# Patient Record
Sex: Female | Born: 1980 | Race: White | Hispanic: No | Smoking: Current every day smoker
Health system: Southern US, Community
[De-identification: ages and names within clinical notes are randomized; demographics above are authoritative.]

## PROBLEM LIST (undated history)

## (undated) DIAGNOSIS — I10 Essential (primary) hypertension: Secondary | ICD-10-CM

## (undated) DIAGNOSIS — M549 Dorsalgia, unspecified: Secondary | ICD-10-CM

## (undated) DIAGNOSIS — F32A Depression, unspecified: Secondary | ICD-10-CM

## (undated) DIAGNOSIS — M40209 Unspecified kyphosis, site unspecified: Secondary | ICD-10-CM

## (undated) DIAGNOSIS — M4200 Juvenile osteochondrosis of spine, site unspecified: Secondary | ICD-10-CM

## (undated) DIAGNOSIS — F419 Anxiety disorder, unspecified: Secondary | ICD-10-CM

## (undated) HISTORY — PX: TONSILLECTOMY: SUR1361

---
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
## 2001-04-22 ENCOUNTER — Encounter: Payer: Self-pay | Admitting: Emergency Medicine

## 2001-04-22 ENCOUNTER — Emergency Department (HOSPITAL_COMMUNITY): Admission: EM | Admit: 2001-04-22 | Discharge: 2001-04-22 | Payer: Self-pay | Admitting: Emergency Medicine

## 2001-05-23 ENCOUNTER — Emergency Department (HOSPITAL_COMMUNITY): Admission: EM | Admit: 2001-05-23 | Discharge: 2001-05-23 | Payer: Self-pay | Admitting: Emergency Medicine

## 2001-07-05 ENCOUNTER — Emergency Department (HOSPITAL_COMMUNITY): Admission: EM | Admit: 2001-07-05 | Discharge: 2001-07-05 | Payer: Self-pay | Admitting: Emergency Medicine

## 2001-07-05 ENCOUNTER — Encounter: Payer: Self-pay | Admitting: Emergency Medicine

## 2002-03-11 ENCOUNTER — Emergency Department (HOSPITAL_COMMUNITY): Admission: EM | Admit: 2002-03-11 | Discharge: 2002-03-11 | Payer: Self-pay | Admitting: Emergency Medicine

## 2003-06-05 ENCOUNTER — Emergency Department (HOSPITAL_COMMUNITY): Admission: EM | Admit: 2003-06-05 | Discharge: 2003-06-05 | Payer: Self-pay | Admitting: Emergency Medicine

## 2004-04-16 ENCOUNTER — Emergency Department (HOSPITAL_COMMUNITY): Admission: EM | Admit: 2004-04-16 | Discharge: 2004-04-16 | Payer: Self-pay | Admitting: Emergency Medicine

## 2004-11-26 ENCOUNTER — Emergency Department (HOSPITAL_COMMUNITY): Admission: EM | Admit: 2004-11-26 | Discharge: 2004-11-26 | Payer: Self-pay | Admitting: Emergency Medicine

## 2005-11-07 ENCOUNTER — Emergency Department (HOSPITAL_COMMUNITY): Admission: EM | Admit: 2005-11-07 | Discharge: 2005-11-07 | Payer: Self-pay | Admitting: Family Medicine

## 2005-12-01 ENCOUNTER — Emergency Department (HOSPITAL_COMMUNITY): Admission: EM | Admit: 2005-12-01 | Discharge: 2005-12-01 | Payer: Self-pay | Admitting: Emergency Medicine

## 2006-12-07 ENCOUNTER — Emergency Department (HOSPITAL_COMMUNITY): Admission: EM | Admit: 2006-12-07 | Discharge: 2006-12-07 | Payer: Self-pay | Admitting: Emergency Medicine

## 2007-07-19 ENCOUNTER — Emergency Department (HOSPITAL_COMMUNITY): Admission: EM | Admit: 2007-07-19 | Discharge: 2007-07-19 | Payer: Self-pay | Admitting: Emergency Medicine

## 2007-11-25 ENCOUNTER — Emergency Department (HOSPITAL_COMMUNITY): Admission: EM | Admit: 2007-11-25 | Discharge: 2007-11-25 | Payer: Self-pay | Admitting: Emergency Medicine

## 2008-11-07 ENCOUNTER — Inpatient Hospital Stay (HOSPITAL_COMMUNITY): Admission: AD | Admit: 2008-11-07 | Discharge: 2008-11-07 | Payer: Self-pay | Admitting: Obstetrics and Gynecology

## 2009-02-12 ENCOUNTER — Inpatient Hospital Stay (HOSPITAL_COMMUNITY): Admission: AD | Admit: 2009-02-12 | Discharge: 2009-02-12 | Payer: Self-pay | Admitting: Family Medicine

## 2009-02-15 ENCOUNTER — Encounter: Payer: Self-pay | Admitting: Family Medicine

## 2009-02-15 ENCOUNTER — Ambulatory Visit (HOSPITAL_COMMUNITY)
Admission: RE | Admit: 2009-02-15 | Discharge: 2009-02-15 | Payer: Self-pay | Source: Home / Self Care | Admitting: Family Medicine

## 2009-03-20 ENCOUNTER — Emergency Department (HOSPITAL_COMMUNITY): Admission: EM | Admit: 2009-03-20 | Discharge: 2009-03-20 | Payer: Self-pay | Admitting: Emergency Medicine

## 2009-05-17 ENCOUNTER — Ambulatory Visit: Payer: Self-pay | Admitting: Obstetrics and Gynecology

## 2009-05-17 ENCOUNTER — Inpatient Hospital Stay (HOSPITAL_COMMUNITY): Admission: AD | Admit: 2009-05-17 | Discharge: 2009-05-17 | Payer: Self-pay | Admitting: Obstetrics and Gynecology

## 2009-05-29 ENCOUNTER — Ambulatory Visit: Payer: Self-pay | Admitting: Obstetrics and Gynecology

## 2009-05-29 LAB — CONVERTED CEMR LAB
ALT: <8 units/L (ref 0–35)
AST: 9 units/L (ref 0–37)
Albumin: 3.4 g/dL — ABNORMAL LOW (ref 3.5–5.2)
Alkaline Phosphatase: 120 units/L — ABNORMAL HIGH (ref 39–117)
Antibody Screen: NEGATIVE
BUN: 3 mg/dL — ABNORMAL LOW (ref 6–23)
Basophils Absolute: 0 10*3/uL (ref 0.0–0.1)
Basophils Relative: 0 % (ref 0–1)
CO2: 20 meq/L (ref 19–32)
Calcium: 8.7 mg/dL (ref 8.4–10.5)
Chloride: 106 meq/L (ref 96–112)
Creatinine, Ser: 0.4 mg/dL (ref 0.40–1.20)
Eosinophils Absolute: 0.1 10*3/uL (ref 0.0–0.7)
Eosinophils Relative: 1 % (ref 0–5)
Glucose, Bld: 132 mg/dL — ABNORMAL HIGH (ref 70–99)
HCT: 31.5 % — ABNORMAL LOW (ref 36.0–46.0)
Hemoglobin: 10.8 g/dL — ABNORMAL LOW (ref 12.0–15.0)
Hepatitis B Surface Ag: NEGATIVE
Lymphocytes Relative: 24 % (ref 12–46)
Lymphs Abs: 2.3 10*3/uL (ref 0.7–4.0)
MCHC: 34.3 g/dL (ref 30.0–36.0)
MCV: 85.8 fL (ref 78.0–100.0)
Monocytes Absolute: 0.4 10*3/uL (ref 0.1–1.0)
Monocytes Relative: 4 % (ref 3–12)
Neutro Abs: 6.6 10*3/uL (ref 1.7–7.7)
Neutrophils Relative %: 70 % (ref 43–77)
Platelets: 220 10*3/uL (ref 150–400)
Potassium: 3.6 meq/L (ref 3.5–5.3)
RBC: 3.67 M/uL — ABNORMAL LOW (ref 3.87–5.11)
RDW: 13.5 % (ref 11.5–15.5)
Rh Type: POSITIVE
Rubella: 69.8 intl units/mL — ABNORMAL HIGH
Sodium: 138 meq/L (ref 135–145)
TSH: 1.422 microintl units/mL (ref 0.350–4.500)
Total Bilirubin: 0.6 mg/dL (ref 0.3–1.2)
Total Protein: 6.2 g/dL (ref 6.0–8.3)
Uric Acid, Serum: 3.6 mg/dL (ref 2.4–7.0)
WBC: 9.3 10*3/uL (ref 4.0–10.5)

## 2009-05-30 ENCOUNTER — Ambulatory Visit (HOSPITAL_COMMUNITY): Admission: RE | Admit: 2009-05-30 | Discharge: 2009-05-30 | Payer: Self-pay | Admitting: Obstetrics and Gynecology

## 2009-05-30 ENCOUNTER — Ambulatory Visit: Payer: Self-pay | Admitting: Obstetrics and Gynecology

## 2009-05-30 ENCOUNTER — Encounter: Payer: Self-pay | Admitting: Obstetrics & Gynecology

## 2009-05-30 LAB — CONVERTED CEMR LAB
Collection Interval-CRCL: 24 hr
Creatinine 24 HR UR: 1046 mg/24hr (ref 700–1800)
Creatinine Clearance: 182 mL/min — ABNORMAL HIGH (ref 75–115)
Creatinine, Urine: 123 mg/dL
Protein, Ur: 60 mg/24hr (ref 50–100)

## 2009-06-06 ENCOUNTER — Ambulatory Visit: Payer: Self-pay | Admitting: Obstetrics & Gynecology

## 2009-06-07 ENCOUNTER — Encounter: Payer: Self-pay | Admitting: Obstetrics & Gynecology

## 2009-06-07 LAB — CONVERTED CEMR LAB
Amphetamine Screen, Ur: NEGATIVE
Barbiturate Quant, Ur: NEGATIVE
Benzodiazepines.: NEGATIVE
Cocaine Metabolites: NEGATIVE
Creatinine,U: 52 mg/dL
Marijuana Metabolite: NEGATIVE
Methadone: NEGATIVE
Opiate Screen, Urine: NEGATIVE
Phencyclidine (PCP): NEGATIVE
Propoxyphene: NEGATIVE

## 2009-06-27 ENCOUNTER — Encounter: Payer: Self-pay | Admitting: Obstetrics and Gynecology

## 2009-06-27 ENCOUNTER — Ambulatory Visit: Payer: Self-pay | Admitting: Obstetrics & Gynecology

## 2009-06-27 ENCOUNTER — Encounter (INDEPENDENT_AMBULATORY_CARE_PROVIDER_SITE_OTHER): Payer: Self-pay | Admitting: *Deleted

## 2009-06-27 LAB — CONVERTED CEMR LAB
ALT: <8 units/L (ref 0–35)
AST: 10 units/L (ref 0–37)
Albumin: 3.3 g/dL — ABNORMAL LOW (ref 3.5–5.2)
Alkaline Phosphatase: 184 units/L — ABNORMAL HIGH (ref 39–117)
BUN: 5 mg/dL — ABNORMAL LOW (ref 6–23)
CO2: 22 meq/L (ref 19–32)
Calcium: 9 mg/dL (ref 8.4–10.5)
Chlamydia, DNA Probe: NEGATIVE
Chloride: 105 meq/L (ref 96–112)
Creatinine, Ser: 0.43 mg/dL (ref 0.40–1.20)
GC Probe Amp, Genital: NEGATIVE
Glucose, Bld: 77 mg/dL (ref 70–99)
HCT: 31.6 % — ABNORMAL LOW (ref 36.0–46.0)
Hemoglobin: 10.4 g/dL — ABNORMAL LOW (ref 12.0–15.0)
MCHC: 32.9 g/dL (ref 30.0–36.0)
MCV: 82.9 fL (ref 78.0–100.0)
Platelets: 260 10*3/uL (ref 150–400)
Potassium: 3.8 meq/L (ref 3.5–5.3)
RBC: 3.81 M/uL — ABNORMAL LOW (ref 3.87–5.11)
RDW: 12.9 % (ref 11.5–15.5)
Sodium: 136 meq/L (ref 135–145)
Total Bilirubin: 0.5 mg/dL (ref 0.3–1.2)
Total Protein: 6.3 g/dL (ref 6.0–8.3)
Uric Acid, Serum: 3.7 mg/dL (ref 2.4–7.0)
WBC: 10.7 10*3/uL — ABNORMAL HIGH (ref 4.0–10.5)

## 2009-06-28 ENCOUNTER — Encounter (INDEPENDENT_AMBULATORY_CARE_PROVIDER_SITE_OTHER): Payer: Self-pay | Admitting: *Deleted

## 2009-07-01 ENCOUNTER — Ambulatory Visit: Payer: Self-pay | Admitting: Obstetrics & Gynecology

## 2009-07-01 ENCOUNTER — Ambulatory Visit (HOSPITAL_COMMUNITY): Admission: RE | Admit: 2009-07-01 | Discharge: 2009-07-01 | Payer: Self-pay | Admitting: Family Medicine

## 2009-07-02 ENCOUNTER — Encounter: Payer: Self-pay | Admitting: Obstetrics and Gynecology

## 2009-07-02 LAB — CONVERTED CEMR LAB
Collection Interval-CRCL: 24 hr
Creatinine 24 HR UR: 614 mg/24hr — ABNORMAL LOW (ref 700–1800)
Creatinine Clearance: 99 mL/min (ref 75–115)
Creatinine, Urine: 37.2 mg/dL
Protein, Ur: 50 mg/24hr (ref 50–100)

## 2009-07-04 ENCOUNTER — Ambulatory Visit: Payer: Self-pay | Admitting: Obstetrics & Gynecology

## 2009-07-04 ENCOUNTER — Inpatient Hospital Stay (HOSPITAL_COMMUNITY)
Admission: AD | Admit: 2009-07-04 | Discharge: 2009-07-07 | Payer: Self-pay | Source: Home / Self Care | Admitting: Obstetrics & Gynecology

## 2009-07-05 ENCOUNTER — Encounter: Payer: Self-pay | Admitting: Obstetrics & Gynecology

## 2009-07-11 ENCOUNTER — Ambulatory Visit: Payer: Self-pay | Admitting: Obstetrics & Gynecology

## 2009-07-11 LAB — CONVERTED CEMR LAB
Albumin: 2.8 g/dL — ABNORMAL LOW (ref 3.5–5.2)
Alkaline Phosphatase: 111 units/L (ref 39–117)
BUN: 4 mg/dL — ABNORMAL LOW (ref 6–23)
Creatinine, Ser: 0.37 mg/dL — ABNORMAL LOW (ref 0.40–1.20)
Glucose, Bld: 71 mg/dL (ref 70–99)
HCT: 23.8 % — ABNORMAL LOW (ref 36.0–46.0)
Hemoglobin: 7.6 g/dL — ABNORMAL LOW (ref 12.0–15.0)
MCHC: 31.9 g/dL (ref 30.0–36.0)
MCV: 84.1 fL (ref 78.0–100.0)
Potassium: 3.7 meq/L (ref 3.5–5.3)
RBC: 2.83 M/uL — ABNORMAL LOW (ref 3.87–5.11)
RDW: 13.2 % (ref 11.5–15.5)

## 2009-07-26 ENCOUNTER — Emergency Department (HOSPITAL_COMMUNITY): Admission: EM | Admit: 2009-07-26 | Discharge: 2009-07-26 | Payer: Self-pay | Admitting: Emergency Medicine

## 2009-08-03 ENCOUNTER — Emergency Department (HOSPITAL_COMMUNITY): Admission: EM | Admit: 2009-08-03 | Discharge: 2009-08-03 | Payer: Self-pay | Admitting: Emergency Medicine

## 2009-08-30 ENCOUNTER — Ambulatory Visit: Payer: Self-pay | Admitting: Obstetrics and Gynecology

## 2009-08-30 ENCOUNTER — Encounter: Payer: Self-pay | Admitting: Physician Assistant

## 2009-08-30 LAB — CONVERTED CEMR LAB
HCT: 38.4 % (ref 36.0–46.0)
Hemoglobin: 11.7 g/dL — ABNORMAL LOW (ref 12.0–15.0)
MCHC: 30.5 g/dL (ref 30.0–36.0)
RBC: 4.84 M/uL (ref 3.87–5.11)

## 2009-09-04 ENCOUNTER — Ambulatory Visit: Payer: Self-pay | Admitting: Obstetrics and Gynecology

## 2009-11-03 ENCOUNTER — Emergency Department (HOSPITAL_COMMUNITY): Admission: EM | Admit: 2009-11-03 | Discharge: 2009-11-03 | Payer: Self-pay | Admitting: Family Medicine

## 2010-01-05 ENCOUNTER — Emergency Department (HOSPITAL_COMMUNITY)
Admission: EM | Admit: 2010-01-05 | Discharge: 2010-01-05 | Payer: Self-pay | Source: Home / Self Care | Admitting: Emergency Medicine

## 2010-02-18 ENCOUNTER — Inpatient Hospital Stay (HOSPITAL_COMMUNITY)
Admission: AD | Admit: 2010-02-18 | Discharge: 2010-02-18 | Payer: Self-pay | Source: Home / Self Care | Attending: Obstetrics and Gynecology | Admitting: Obstetrics and Gynecology

## 2010-02-24 LAB — WET PREP, GENITAL
Clue Cells Wet Prep HPF POC: NONE SEEN
Yeast Wet Prep HPF POC: NONE SEEN

## 2010-02-24 LAB — GC/CHLAMYDIA PROBE AMP, GENITAL
Chlamydia, DNA Probe: NEGATIVE
GC Probe Amp, Genital: NEGATIVE

## 2010-02-24 LAB — HERPES SIMPLEX VIRUS CULTURE: Culture: NOT DETECTED

## 2010-03-29 ENCOUNTER — Emergency Department (HOSPITAL_COMMUNITY): Payer: Self-pay

## 2010-03-29 ENCOUNTER — Emergency Department (HOSPITAL_COMMUNITY)
Admission: EM | Admit: 2010-03-29 | Discharge: 2010-03-29 | Disposition: A | Discharge status: Home / Self Care | Payer: Self-pay | Attending: Emergency Medicine | Admitting: Emergency Medicine

## 2010-03-29 DIAGNOSIS — Y929 Unspecified place or not applicable: Secondary | ICD-10-CM | POA: Insufficient documentation

## 2010-03-29 DIAGNOSIS — I10 Essential (primary) hypertension: Secondary | ICD-10-CM | POA: Insufficient documentation

## 2010-03-29 DIAGNOSIS — X58XXXA Exposure to other specified factors, initial encounter: Secondary | ICD-10-CM | POA: Insufficient documentation

## 2010-03-29 DIAGNOSIS — S60229A Contusion of unspecified hand, initial encounter: Secondary | ICD-10-CM | POA: Insufficient documentation

## 2010-03-29 DIAGNOSIS — M79609 Pain in unspecified limb: Secondary | ICD-10-CM | POA: Insufficient documentation

## 2010-03-29 DIAGNOSIS — Z79899 Other long term (current) drug therapy: Secondary | ICD-10-CM | POA: Insufficient documentation

## 2010-04-03 ENCOUNTER — Emergency Department (HOSPITAL_COMMUNITY)
Admission: EM | Admit: 2010-04-03 | Discharge: 2010-04-03 | Disposition: A | Discharge status: Home / Self Care | Payer: Self-pay | Attending: Emergency Medicine | Admitting: Emergency Medicine

## 2010-04-03 DIAGNOSIS — I10 Essential (primary) hypertension: Secondary | ICD-10-CM | POA: Insufficient documentation

## 2010-04-03 DIAGNOSIS — G43909 Migraine, unspecified, not intractable, without status migrainosus: Secondary | ICD-10-CM | POA: Insufficient documentation

## 2010-04-27 LAB — URINALYSIS, ROUTINE W REFLEX MICROSCOPIC
Bilirubin Urine: NEGATIVE
Glucose, UA: NEGATIVE mg/dL
Hgb urine dipstick: NEGATIVE
Ketones, ur: NEGATIVE mg/dL
Nitrite: NEGATIVE
Specific Gravity, Urine: 1.015 (ref 1.005–1.030)
Urobilinogen, UA: 0.2 mg/dL (ref 0.0–1.0)
pH: 6.5 (ref 5.0–8.0)
pH: 6 (ref 5.0–8.0)

## 2010-04-27 LAB — WET PREP, GENITAL

## 2010-04-27 LAB — URINE MICROSCOPIC-ADD ON

## 2010-04-28 LAB — CBC
HCT: 22.4 % — ABNORMAL LOW (ref 36.0–46.0)
Hemoglobin: 7.8 g/dL — ABNORMAL LOW (ref 12.0–15.0)
Hemoglobin: 8.9 g/dL — ABNORMAL LOW (ref 12.0–15.0)
MCHC: 34.4 g/dL (ref 30.0–36.0)
MCHC: 34.5 g/dL (ref 30.0–36.0)
MCV: 84.6 fL (ref 78.0–100.0)
Platelets: 188 10*3/uL (ref 150–400)
Platelets: 230 10*3/uL (ref 150–400)
RDW: 13 % (ref 11.5–15.5)
WBC: 11.4 10*3/uL — ABNORMAL HIGH (ref 4.0–10.5)
WBC: 12.9 10*3/uL — ABNORMAL HIGH (ref 4.0–10.5)

## 2010-04-28 LAB — MRSA PCR SCREENING: MRSA by PCR: NEGATIVE

## 2010-04-28 LAB — POCT URINALYSIS DIP (DEVICE)
Bilirubin Urine: NEGATIVE
Glucose, UA: NEGATIVE mg/dL
Glucose, UA: NEGATIVE mg/dL
Hgb urine dipstick: NEGATIVE
Ketones, ur: NEGATIVE mg/dL
Ketones, ur: NEGATIVE mg/dL
Nitrite: NEGATIVE
Protein, ur: NEGATIVE mg/dL
Protein, ur: NEGATIVE mg/dL
Specific Gravity, Urine: <=1.005 (ref 1.005–1.030)
Urobilinogen, UA: 0.2 mg/dL (ref 0.0–1.0)
Urobilinogen, UA: 0.2 mg/dL (ref 0.0–1.0)
pH: 7 (ref 5.0–8.0)

## 2010-04-28 LAB — URINALYSIS, ROUTINE W REFLEX MICROSCOPIC
Bilirubin Urine: NEGATIVE
Glucose, UA: NEGATIVE mg/dL
Ketones, ur: NEGATIVE mg/dL
Nitrite: NEGATIVE
Nitrite: NEGATIVE
Protein, ur: NEGATIVE mg/dL
Protein, ur: NEGATIVE mg/dL
Specific Gravity, Urine: 1.015 (ref 1.005–1.030)
pH: 7 (ref 5.0–8.0)
pH: 7 (ref 5.0–8.0)

## 2010-04-28 LAB — COMPREHENSIVE METABOLIC PANEL
ALT: 10 U/L (ref 0–35)
ALT: 12 U/L (ref 0–35)
AST: 14 U/L (ref 0–37)
Albumin: 2.7 g/dL — ABNORMAL LOW (ref 3.5–5.2)
BUN: 2 mg/dL — ABNORMAL LOW (ref 6–23)
Calcium: 8.3 mg/dL — ABNORMAL LOW (ref 8.4–10.5)
Calcium: 8.9 mg/dL (ref 8.4–10.5)
Creatinine, Ser: 0.37 mg/dL — ABNORMAL LOW (ref 0.4–1.2)
GFR calc Af Amer: >60 mL/min (ref >60)
Glucose, Bld: 77 mg/dL (ref 70–99)
Glucose, Bld: 77 mg/dL (ref 70–99)
Potassium: 3.2 mEq/L — ABNORMAL LOW (ref 3.5–5.1)
Sodium: 136 mEq/L (ref 135–145)
Sodium: 137 mEq/L (ref 135–145)
Total Protein: 4.6 g/dL — ABNORMAL LOW (ref 6.0–8.3)
Total Protein: 6.3 g/dL (ref 6.0–8.3)

## 2010-04-28 LAB — PROTEIN / CREATININE RATIO, URINE
Creatinine, Urine: 33.6 mg/dL
Total Protein, Urine: <6 mg/dL

## 2010-04-28 LAB — RPR: RPR Ser Ql: NONREACTIVE

## 2010-04-29 LAB — POCT URINALYSIS DIP (DEVICE)
Hgb urine dipstick: NEGATIVE
Hgb urine dipstick: NEGATIVE
Ketones, ur: NEGATIVE mg/dL
Protein, ur: NEGATIVE mg/dL
Protein, ur: NEGATIVE mg/dL
Specific Gravity, Urine: 1.01 (ref 1.005–1.030)
Specific Gravity, Urine: 1.015 (ref 1.005–1.030)
Urobilinogen, UA: 0.2 mg/dL (ref 0.0–1.0)
pH: 6.5 (ref 5.0–8.0)

## 2010-04-30 LAB — GC/CHLAMYDIA PROBE AMP, GENITAL: GC Probe Amp, Genital: NEGATIVE

## 2010-04-30 LAB — URINALYSIS, ROUTINE W REFLEX MICROSCOPIC
Hgb urine dipstick: NEGATIVE
Protein, ur: NEGATIVE mg/dL
Urobilinogen, UA: 0.2 mg/dL (ref 0.0–1.0)

## 2010-04-30 LAB — WET PREP, GENITAL
Clue Cells Wet Prep HPF POC: NONE SEEN
Trich, Wet Prep: NONE SEEN

## 2010-04-30 LAB — RAPID URINE DRUG SCREEN, HOSP PERFORMED
Amphetamines: NOT DETECTED
Barbiturates: NOT DETECTED

## 2010-05-16 LAB — URINALYSIS, ROUTINE W REFLEX MICROSCOPIC
Glucose, UA: NEGATIVE mg/dL
Nitrite: NEGATIVE
Protein, ur: NEGATIVE mg/dL
Urobilinogen, UA: 2 mg/dL — ABNORMAL HIGH (ref 0.0–1.0)

## 2010-05-16 LAB — WET PREP, GENITAL

## 2010-05-16 LAB — POCT PREGNANCY, URINE: Preg Test, Ur: POSITIVE

## 2010-05-16 LAB — CBC
HCT: 39.6 % (ref 36.0–46.0)
Platelets: 216 10*3/uL (ref 150–400)
WBC: 8.5 10*3/uL (ref 4.0–10.5)

## 2010-05-16 LAB — HCG, QUANTITATIVE, PREGNANCY: hCG, Beta Chain, Quant, S: 4155 m[IU]/mL — ABNORMAL HIGH (ref <5)

## 2010-05-16 LAB — GC/CHLAMYDIA PROBE AMP, GENITAL: Chlamydia, DNA Probe: NEGATIVE

## 2010-05-31 ENCOUNTER — Inpatient Hospital Stay (INDEPENDENT_AMBULATORY_CARE_PROVIDER_SITE_OTHER)
Admission: RE | Admit: 2010-05-31 | Discharge: 2010-05-31 | Disposition: A | Discharge status: Home / Self Care | Payer: Self-pay | Source: Ambulatory Visit | Attending: Family Medicine | Admitting: Family Medicine

## 2010-05-31 DIAGNOSIS — J309 Allergic rhinitis, unspecified: Secondary | ICD-10-CM

## 2010-05-31 DIAGNOSIS — M799 Soft tissue disorder, unspecified: Secondary | ICD-10-CM

## 2010-08-31 ENCOUNTER — Emergency Department (HOSPITAL_COMMUNITY)
Admission: EM | Admit: 2010-08-31 | Discharge: 2010-08-31 | Disposition: A | Discharge status: Home / Self Care | Payer: Medicaid Other | Attending: Emergency Medicine | Admitting: Emergency Medicine

## 2010-08-31 ENCOUNTER — Encounter: Payer: Self-pay | Admitting: *Deleted

## 2010-08-31 DIAGNOSIS — F17200 Nicotine dependence, unspecified, uncomplicated: Secondary | ICD-10-CM | POA: Insufficient documentation

## 2010-08-31 DIAGNOSIS — I10 Essential (primary) hypertension: Secondary | ICD-10-CM | POA: Insufficient documentation

## 2010-08-31 DIAGNOSIS — R51 Headache: Secondary | ICD-10-CM | POA: Insufficient documentation

## 2010-08-31 DIAGNOSIS — R110 Nausea: Secondary | ICD-10-CM | POA: Insufficient documentation

## 2010-08-31 HISTORY — DX: Essential (primary) hypertension: I10

## 2010-08-31 MED ORDER — METOCLOPRAMIDE HCL 5 MG/ML IJ SOLN
10.0000 mg | Freq: Once | INTRAMUSCULAR | Status: AC
  Administered 2010-08-31: 10 mg via INTRAVENOUS
  Filled 2010-08-31: qty 2

## 2010-08-31 MED ORDER — NAPROXEN 500 MG PO TABS: 250.0000 mg | ORAL_TABLET | Freq: Two times a day (BID) | ORAL | Status: DC | PRN

## 2010-08-31 MED ORDER — MORPHINE SULFATE 4 MG/ML IJ SOLN
4.0000 mg | Freq: Once | INTRAMUSCULAR | Status: AC
  Administered 2010-08-31: 4 mg via INTRAVENOUS
  Filled 2010-08-31: qty 1

## 2010-08-31 MED ORDER — KETOROLAC TROMETHAMINE 30 MG/ML IJ SOLN
30.0000 mg | Freq: Once | INTRAMUSCULAR | Status: AC
  Administered 2010-08-31: 30 mg via INTRAVENOUS
  Filled 2010-08-31: qty 1

## 2010-08-31 MED ORDER — SODIUM CHLORIDE 0.9 % IV BOLUS (SEPSIS)
1000.0000 mL | Freq: Once | INTRAVENOUS | Status: AC
  Administered 2010-08-31: 1000 mL via INTRAVENOUS
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
## 2010-08-31 NOTE — ED Notes (Signed)
Report received. Pt to be discharged after remaining fluids administered.

## 2010-08-31 NOTE — ED Notes (Signed)
Pt has a history of migraines. Pt states she woke up with a severe headache and nausea.

## 2010-08-31 NOTE — ED Provider Notes (Signed)
History     Chief Complaint  Patient presents with  . Headache   Patient is a 30 y.o. female presenting with headaches. The history is provided by the patient.  Headache  This is a recurrent problem. The current episode started 1 to 2 hours ago. The problem occurs constantly. The problem has not changed since onset.The headache is associated with bright light and loud noise. The pain is located in the left unilateral region. The quality of the pain is described as dull and throbbing. The pain is moderate. The pain does not radiate. Associated symptoms include nausea. Pertinent negatives include no fever and no vomiting. She has tried NSAIDs for the symptoms. The treatment provided no relief.    Past Medical History  Diagnosis Date  . Hypertension     History reviewed. No pertinent past surgical history.  History reviewed. No pertinent family history.  History  Substance Use Topics  . Smoking status: Current Everyday Smoker  . Smokeless tobacco: Not on file  . Alcohol Use: No    OB History    Grav Para Term Preterm Abortions TAB SAB Ect Mult Living                  Review of Systems  Constitutional: Negative for fever.  Gastrointestinal: Positive for nausea. Negative for vomiting.  Neurological: Positive for headaches.  All other systems reviewed and are negative.    Physical Exam  BP 152/108  Pulse 99  Temp 97.8 F (36.6 C)  Resp 20  Ht 5\' 2"  (1.575 m)  Wt 165 lb (74.844 kg)  BMI 30.18 kg/m2  SpO2 99%  Physical Exam  Nursing note and vitals reviewed. Constitutional: She is oriented to person, place, and time. She appears well-developed and well-nourished. No distress.  HENT:  Head: Normocephalic and atraumatic.  Eyes: EOM are normal.  Neck: Normal range of motion.  Cardiovascular: Normal rate, regular rhythm and normal heart sounds.   Pulmonary/Chest: Effort normal and breath sounds normal.  Abdominal: Soft. She exhibits no distension. There is no  tenderness.  Musculoskeletal: Normal range of motion.  Neurological: She is alert and oriented to person, place, and time. She has normal strength. GCS eye subscore is 4. GCS verbal subscore is 5. GCS motor subscore is 6.  Skin: Skin is warm and dry.  Psychiatric: She has a normal mood and affect. Judgment normal.    ED Course  Procedures  MDM Typical migraine headache for the pt. Non focal neuro exam. No recent head trauma. No fever. Doubt meningitis. Doubt intracranial bleed. Doubt normal pressure hydrocephalus. No indication for imaging. Improved with tx in ER .  Home with neuro followup     Lyanne Co, MD 08/31/10 [id number]

## 2010-09-01 ENCOUNTER — Other Ambulatory Visit: Payer: Self-pay | Admitting: Obstetrics & Gynecology

## 2010-10-28 ENCOUNTER — Emergency Department (HOSPITAL_COMMUNITY)
Admission: EM | Admit: 2010-10-28 | Discharge: 2010-10-28 | Disposition: A | Discharge status: Home / Self Care | Payer: Medicaid Other | Attending: Emergency Medicine | Admitting: Emergency Medicine

## 2010-10-28 ENCOUNTER — Encounter (HOSPITAL_COMMUNITY): Payer: Self-pay | Admitting: *Deleted

## 2010-10-28 DIAGNOSIS — T2220XA Burn of second degree of shoulder and upper limb, except wrist and hand, unspecified site, initial encounter: Secondary | ICD-10-CM

## 2010-10-28 DIAGNOSIS — T23219A Burn of second degree of unspecified thumb (nail), initial encounter: Secondary | ICD-10-CM | POA: Insufficient documentation

## 2010-10-28 DIAGNOSIS — T2112XA Burn of first degree of abdominal wall, initial encounter: Secondary | ICD-10-CM | POA: Insufficient documentation

## 2010-10-28 DIAGNOSIS — I10 Essential (primary) hypertension: Secondary | ICD-10-CM | POA: Insufficient documentation

## 2010-10-28 DIAGNOSIS — F17200 Nicotine dependence, unspecified, uncomplicated: Secondary | ICD-10-CM | POA: Insufficient documentation

## 2010-10-28 DIAGNOSIS — X19XXXA Contact with other heat and hot substances, initial encounter: Secondary | ICD-10-CM | POA: Insufficient documentation

## 2010-10-28 MED ORDER — TETANUS-DIPHTH-ACELL PERTUSSIS 5-2.5-18.5 LF-MCG/0.5 IM SUSP
0.5000 mL | Freq: Once | INTRAMUSCULAR | Status: AC
  Administered 2010-10-28: 0.5 mL via INTRAMUSCULAR
  Filled 2010-10-28: qty 0.5

## 2010-10-28 MED ORDER — OXYCODONE-ACETAMINOPHEN 5-325 MG PO TABS
1.0000 | ORAL_TABLET | Freq: Once | ORAL | Status: AC
  Administered 2010-10-28: 1 via ORAL
  Filled 2010-10-28: qty 1

## 2010-10-28 MED ORDER — SILVER SULFADIAZINE 1 % EX CREA
TOPICAL_CREAM | Freq: Once | CUTANEOUS | Status: AC
  Administered 2010-10-28: 22:00:00 via TOPICAL
  Filled 2010-10-28: qty 50

## 2010-10-28 MED ORDER — OXYCODONE-ACETAMINOPHEN 5-325 MG PO TABS: 1.0000 | ORAL_TABLET | ORAL | Status: AC | PRN

## 2010-10-28 MED ORDER — SILVER SULFADIAZINE 1 % EX CREA
TOPICAL_CREAM | Freq: Every day | CUTANEOUS | Status: DC
Start: 2010-10-28 — End: 2010-12-19
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
## 2010-10-30 ENCOUNTER — Encounter (HOSPITAL_COMMUNITY): Payer: Self-pay | Admitting: *Deleted

## 2010-10-30 ENCOUNTER — Emergency Department (HOSPITAL_COMMUNITY)
Admission: EM | Admit: 2010-10-30 | Discharge: 2010-10-30 | Disposition: A | Discharge status: Home / Self Care | Payer: Medicaid Other | Attending: Emergency Medicine | Admitting: Emergency Medicine

## 2010-10-30 DIAGNOSIS — I10 Essential (primary) hypertension: Secondary | ICD-10-CM | POA: Insufficient documentation

## 2010-10-30 DIAGNOSIS — Z4800 Encounter for change or removal of nonsurgical wound dressing: Secondary | ICD-10-CM | POA: Insufficient documentation

## 2010-10-30 DIAGNOSIS — T23229A Burn of second degree of unspecified single finger (nail) except thumb, initial encounter: Secondary | ICD-10-CM

## 2010-10-30 MED ORDER — IBUPROFEN 800 MG PO TABS: 800.0000 mg | ORAL_TABLET | Freq: Three times a day (TID) | ORAL | Status: AC | PRN

## 2010-10-30 MED ORDER — OXYCODONE-ACETAMINOPHEN 5-325 MG PO TABS
2.0000 | ORAL_TABLET | Freq: Once | ORAL | Status: AC
  Administered 2010-10-30: 2 via ORAL
  Filled 2010-10-30: qty 2

## 2010-10-30 MED ORDER — IBUPROFEN 800 MG PO TABS
800.0000 mg | ORAL_TABLET | Freq: Once | ORAL | Status: AC
  Administered 2010-10-30: 800 mg via ORAL
  Filled 2010-10-30: qty 1
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
## 2010-10-30 NOTE — ED Provider Notes (Signed)
Medical screening examination/treatment/procedure(s) were conducted as a shared visit with non-physician practitioner(s) and myself.  I personally evaluated the patient during the encounter  SEE BLANK NOTE   Shelda Jakes, MD 10/30/10 1630
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
## 2010-11-01 ENCOUNTER — Encounter (HOSPITAL_COMMUNITY): Payer: Self-pay

## 2010-11-01 ENCOUNTER — Emergency Department (HOSPITAL_COMMUNITY)
Admission: EM | Admit: 2010-11-01 | Discharge: 2010-11-01 | Disposition: A | Discharge status: Home / Self Care | Payer: Medicaid Other | Attending: Emergency Medicine | Admitting: Emergency Medicine

## 2010-11-01 DIAGNOSIS — IMO0002 Reserved for concepts with insufficient information to code with codable children: Secondary | ICD-10-CM | POA: Insufficient documentation

## 2010-11-01 DIAGNOSIS — Z5189 Encounter for other specified aftercare: Secondary | ICD-10-CM | POA: Insufficient documentation

## 2010-11-01 DIAGNOSIS — T300 Burn of unspecified body region, unspecified degree: Secondary | ICD-10-CM

## 2010-11-01 DIAGNOSIS — I10 Essential (primary) hypertension: Secondary | ICD-10-CM | POA: Insufficient documentation

## 2010-11-01 DIAGNOSIS — Z4800 Encounter for change or removal of nonsurgical wound dressing: Secondary | ICD-10-CM | POA: Insufficient documentation

## 2010-11-01 MED ORDER — DOXYCYCLINE HYCLATE 100 MG PO TABS
100.0000 mg | ORAL_TABLET | Freq: Once | ORAL | Status: AC
  Administered 2010-11-01: 100 mg via ORAL
  Filled 2010-11-01: qty 1

## 2010-11-01 MED ORDER — DOXYCYCLINE HYCLATE 100 MG PO CAPS: 100.0000 mg | ORAL_CAPSULE | Freq: Two times a day (BID) | ORAL | Status: AC

## 2010-11-01 MED ORDER — OXYCODONE-ACETAMINOPHEN 5-325 MG PO TABS: ORAL_TABLET | ORAL | Status: DC

## 2010-11-01 MED ORDER — GUAIFENESIN-CODEINE 100-10 MG/5ML PO SYRP: 5.0000 mL | ORAL_SOLUTION | Freq: Three times a day (TID) | ORAL | Status: DC | PRN

## 2010-11-01 MED ORDER — OXYCODONE-ACETAMINOPHEN 5-325 MG PO TABS
1.0000 | ORAL_TABLET | Freq: Once | ORAL | Status: AC
  Administered 2010-11-01: 1 via ORAL
  Filled 2010-11-01: qty 1

## 2010-11-01 MED ORDER — SILVER SULFADIAZINE 1 % EX CREA
TOPICAL_CREAM | Freq: Once | CUTANEOUS | Status: AC
  Administered 2010-11-01: 11:00:00 via TOPICAL
  Filled 2010-11-01: qty 50
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
## 2010-11-01 NOTE — ED Provider Notes (Signed)
Medical screening examination/treatment/procedure(s) were performed by non-physician practitioner and as supervising physician I was immediately available for consultation/collaboration.   Laray Anger, DO 11/01/10 1948
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
## 2010-11-04 NOTE — ED Provider Notes (Signed)
Medical screening examination/treatment/procedure(s) were performed by non-physician practitioner and as supervising physician I was immediately available for consultation/collaboration.   Krystan Northrop R Nicki Gracy, MD 11/04/10 1121 

## 2010-11-06 ENCOUNTER — Emergency Department (HOSPITAL_COMMUNITY)
Admission: EM | Admit: 2010-11-06 | Discharge: 2010-11-06 | Disposition: A | Discharge status: Home / Self Care | Payer: Medicaid Other | Attending: Emergency Medicine | Admitting: Emergency Medicine

## 2010-11-06 ENCOUNTER — Encounter (HOSPITAL_COMMUNITY): Payer: Self-pay | Admitting: *Deleted

## 2010-11-06 DIAGNOSIS — T23219A Burn of second degree of unspecified thumb (nail), initial encounter: Secondary | ICD-10-CM | POA: Insufficient documentation

## 2010-11-06 DIAGNOSIS — M79609 Pain in unspecified limb: Secondary | ICD-10-CM | POA: Insufficient documentation

## 2010-11-06 DIAGNOSIS — X088XXA Exposure to other specified smoke, fire and flames, initial encounter: Secondary | ICD-10-CM | POA: Insufficient documentation

## 2010-11-06 DIAGNOSIS — F17200 Nicotine dependence, unspecified, uncomplicated: Secondary | ICD-10-CM | POA: Insufficient documentation

## 2010-11-06 MED ORDER — OXYCODONE-ACETAMINOPHEN 5-325 MG PO TABS: 1.0000 | ORAL_TABLET | ORAL | Status: AC | PRN

## 2010-11-06 NOTE — ED Provider Notes (Signed)
Medical screening examination/treatment/procedure(s) were performed by non-physician practitioner and as supervising physician I was immediately available for consultation/collaboration.  Forbes Cellar, MD 11/06/10 1024
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
## 2010-11-06 NOTE — ED Notes (Signed)
Patient with no complaints at this time. Respirations even and unlabored. Skin warm/dry. Discharge instructions reviewed with patient at this time. Patient given opportunity to voice concerns/ask questions. IV removed per policy and band-aid applied to site. Patient discharged at this time and left Emergency Department with steady gait.  
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
## 2010-11-07 ENCOUNTER — Ambulatory Visit (HOSPITAL_COMMUNITY)
Admission: RE | Admit: 2010-11-07 | Discharge: 2010-11-07 | Disposition: A | Discharge status: Home / Self Care | Payer: Medicaid Other | Source: Ambulatory Visit | Attending: Emergency Medicine | Admitting: Emergency Medicine

## 2010-11-07 DIAGNOSIS — X12XXXA Contact with other hot fluids, initial encounter: Secondary | ICD-10-CM | POA: Insufficient documentation

## 2010-11-07 DIAGNOSIS — T300 Burn of unspecified body region, unspecified degree: Secondary | ICD-10-CM | POA: Insufficient documentation

## 2010-11-07 DIAGNOSIS — IMO0001 Reserved for inherently not codable concepts without codable children: Secondary | ICD-10-CM | POA: Insufficient documentation

## 2010-11-07 DIAGNOSIS — T23219A Burn of second degree of unspecified thumb (nail), initial encounter: Secondary | ICD-10-CM | POA: Insufficient documentation

## 2010-11-07 DIAGNOSIS — Y92009 Unspecified place in unspecified non-institutional (private) residence as the place of occurrence of the external cause: Secondary | ICD-10-CM | POA: Insufficient documentation

## 2010-11-07 NOTE — Patient Instructions (Addendum)
Dress- pt to stop using silvadene
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
## 2010-11-10 ENCOUNTER — Ambulatory Visit (HOSPITAL_COMMUNITY)
Admission: RE | Admit: 2010-11-10 | Discharge: 2010-11-10 | Disposition: A | Discharge status: Home / Self Care | Payer: Medicaid Other | Source: Ambulatory Visit | Attending: Family Medicine | Admitting: Family Medicine

## 2010-11-10 DIAGNOSIS — IMO0001 Reserved for inherently not codable concepts without codable children: Secondary | ICD-10-CM | POA: Insufficient documentation

## 2010-11-10 DIAGNOSIS — Y92009 Unspecified place in unspecified non-institutional (private) residence as the place of occurrence of the external cause: Secondary | ICD-10-CM | POA: Insufficient documentation

## 2010-11-10 DIAGNOSIS — X12XXXA Contact with other hot fluids, initial encounter: Secondary | ICD-10-CM | POA: Insufficient documentation

## 2010-11-10 DIAGNOSIS — T23219A Burn of second degree of unspecified thumb (nail), initial encounter: Secondary | ICD-10-CM | POA: Insufficient documentation

## 2010-11-10 DIAGNOSIS — T300 Burn of unspecified body region, unspecified degree: Secondary | ICD-10-CM

## 2010-11-10 NOTE — Progress Notes (Signed)
Physical Therapy Treatment Patient Details  Name: Angelica Lopez MRN: [id number] Date of Birth: Aug 14, 1980  Today's Date: 11/10/2010 Time: 4098-1191 Time Calculation (min): 29 min Charges: Debridment < 20cm Visit#: 2  of 4   Re-eval: 11/21/10    Subjective: Symptoms/Limitations Symptoms: Pt reports that she has had some pain over the weekend and she is currently only taking OTC for her pain control.  Most of her pain is under her nail bed.  Pain Assessment Currently in Pain?: Yes Pain Score:   6 Pain Location: Hand Pain Type: Acute pain   Exercise/Treatments Wound Therapy: 1. Wound Debridement to L thumb with Karikleanse/guaze, Forceps and Scissors 2. Dressed with Xeroform and gauze and closed with tape.    Physical Therapy Assessment and Plan PT Assessment and Plan Clinical Impression Statement: Today's treatment focus on debridment and dressing change.  Current assessment after treatment granulation: 95%, 5% slough.  PT Plan: debridement and dressing change to L thumb    Goals    Problem List Patient Active Problem List  Diagnoses  . Burn    PT - End of Session Activity Tolerance: Patient tolerated treatment well  Kriss Ishler 11/10/2010, 11:29 AM

## 2010-11-13 ENCOUNTER — Telehealth (HOSPITAL_COMMUNITY): Payer: Self-pay

## 2010-11-13 ENCOUNTER — Inpatient Hospital Stay (HOSPITAL_COMMUNITY): Admission: RE | Admit: 2010-11-13 | Payer: Medicaid Other | Source: Ambulatory Visit | Admitting: Physical Therapy

## 2010-11-20 ENCOUNTER — Other Ambulatory Visit: Payer: Self-pay | Admitting: Obstetrics and Gynecology

## 2010-11-25 ENCOUNTER — Ambulatory Visit (HOSPITAL_COMMUNITY): Payer: Medicaid Other | Admitting: Physical Therapy

## 2010-12-19 ENCOUNTER — Emergency Department (HOSPITAL_COMMUNITY): Payer: Medicaid Other

## 2010-12-19 ENCOUNTER — Encounter (HOSPITAL_COMMUNITY): Payer: Self-pay | Admitting: Emergency Medicine

## 2010-12-19 ENCOUNTER — Emergency Department (HOSPITAL_COMMUNITY)
Admission: EM | Admit: 2010-12-19 | Discharge: 2010-12-19 | Disposition: A | Discharge status: Home / Self Care | Payer: Medicaid Other | Attending: Emergency Medicine | Admitting: Emergency Medicine

## 2010-12-19 DIAGNOSIS — F17200 Nicotine dependence, unspecified, uncomplicated: Secondary | ICD-10-CM | POA: Insufficient documentation

## 2010-12-19 DIAGNOSIS — R110 Nausea: Secondary | ICD-10-CM | POA: Insufficient documentation

## 2010-12-19 DIAGNOSIS — D72829 Elevated white blood cell count, unspecified: Secondary | ICD-10-CM | POA: Insufficient documentation

## 2010-12-19 DIAGNOSIS — K921 Melena: Secondary | ICD-10-CM | POA: Insufficient documentation

## 2010-12-19 DIAGNOSIS — R10819 Abdominal tenderness, unspecified site: Secondary | ICD-10-CM | POA: Insufficient documentation

## 2010-12-19 DIAGNOSIS — R143 Flatulence: Secondary | ICD-10-CM | POA: Insufficient documentation

## 2010-12-19 DIAGNOSIS — Z975 Presence of (intrauterine) contraceptive device: Secondary | ICD-10-CM | POA: Insufficient documentation

## 2010-12-19 DIAGNOSIS — R109 Unspecified abdominal pain: Secondary | ICD-10-CM | POA: Insufficient documentation

## 2010-12-19 DIAGNOSIS — R197 Diarrhea, unspecified: Secondary | ICD-10-CM | POA: Insufficient documentation

## 2010-12-19 DIAGNOSIS — R142 Eructation: Secondary | ICD-10-CM | POA: Insufficient documentation

## 2010-12-19 DIAGNOSIS — I10 Essential (primary) hypertension: Secondary | ICD-10-CM | POA: Insufficient documentation

## 2010-12-19 DIAGNOSIS — R141 Gas pain: Secondary | ICD-10-CM | POA: Insufficient documentation

## 2010-12-19 LAB — BASIC METABOLIC PANEL
CO2: 26 mEq/L (ref 19–32)
Calcium: 9.7 mg/dL (ref 8.4–10.5)
Creatinine, Ser: 0.68 mg/dL (ref 0.50–1.10)
GFR calc Af Amer: >90 mL/min (ref >90)
GFR calc non Af Amer: >90 mL/min (ref >90)

## 2010-12-19 LAB — DIFFERENTIAL
Basophils Absolute: 0 10*3/uL (ref 0.0–0.1)
Basophils Relative: 0 % (ref 0–1)
Eosinophils Absolute: 0 10*3/uL (ref 0.0–0.7)
Eosinophils Relative: 0 % (ref 0–5)
Monocytes Absolute: 0.6 10*3/uL (ref 0.1–1.0)

## 2010-12-19 LAB — URINALYSIS, ROUTINE W REFLEX MICROSCOPIC
Bilirubin Urine: NEGATIVE
Hgb urine dipstick: NEGATIVE
Nitrite: NEGATIVE
Protein, ur: NEGATIVE mg/dL
Urobilinogen, UA: 0.2 mg/dL (ref 0.0–1.0)

## 2010-12-19 LAB — CBC
HCT: 43.8 % (ref 36.0–46.0)
MCH: 29.4 pg (ref 26.0–34.0)
MCHC: 32.6 g/dL (ref 30.0–36.0)
MCV: 90.1 fL (ref 78.0–100.0)
Platelets: 194 10*3/uL (ref 150–400)
RDW: 13.1 % (ref 11.5–15.5)

## 2010-12-19 LAB — LIPASE, BLOOD: Lipase: 12 U/L (ref 11–59)

## 2010-12-19 LAB — URINE MICROSCOPIC-ADD ON

## 2010-12-19 MED ORDER — HYDROMORPHONE HCL PF 2 MG/ML IJ SOLN
1.0000 mg | Freq: Once | INTRAMUSCULAR | Status: AC
  Administered 2010-12-19: 1 mg via INTRAVENOUS
  Filled 2010-12-19: qty 1

## 2010-12-19 MED ORDER — HYDROMORPHONE HCL PF 1 MG/ML IJ SOLN
INTRAMUSCULAR | Status: AC
  Administered 2010-12-19: 1 mg via INTRAVENOUS
  Filled 2010-12-19: qty 1

## 2010-12-19 MED ORDER — ONDANSETRON HCL 4 MG/2ML IJ SOLN
4.0000 mg | Freq: Once | INTRAMUSCULAR | Status: AC
  Administered 2010-12-19: 4 mg via INTRAVENOUS

## 2010-12-19 MED ORDER — HYDROMORPHONE HCL PF 2 MG/ML IJ SOLN
1.0000 mg | Freq: Once | INTRAMUSCULAR | Status: AC
  Administered 2010-12-19: 1 mg via INTRAVENOUS

## 2010-12-19 MED ORDER — SODIUM CHLORIDE 0.9 % IV BOLUS (SEPSIS)
1000.0000 mL | Freq: Once | INTRAVENOUS | Status: AC
  Administered 2010-12-19: 1000 mL via INTRAVENOUS

## 2010-12-19 NOTE — ED Notes (Signed)
Mid abd pain that started last night.  Denies n/v, one episode of diarrhea.  Abd soft, non-distended, tender upon palpation.  Denies GU sx.  Pt tearful, alert and oriented x 4, nad noted.  edp at bedside.

## 2010-12-19 NOTE — ED Notes (Signed)
Pt states she began having abdominal pain yesterday.  Pt states she had diarrhea on Wednesday.  Pt denies nausea and vomiting.  Pt states she had small amount of bright red blood in her stool yesterday.  Pt is alert and oriented x 4 and respirations even and unlabored.

## 2010-12-19 NOTE — Discharge Instructions (Signed)
Use a mid laxative like Miralax to help get you started on having several bowel movements   . Abdominal Pain (Nonspecific) Your exam might not show the exact reason you have abdominal pain. Since there are many different causes of abdominal pain, another checkup and more tests may be needed. It is very important to follow up for lasting (persistent) or worsening symptoms. A possible cause of abdominal pain in any person who still has his or her appendix is acute appendicitis. Appendicitis is often hard to diagnose. Normal blood tests, urine tests, ultrasound, and CT scans do not completely rule out early appendicitis or other causes of abdominal pain. Sometimes, only the changes that happen over time will allow appendicitis and other causes of abdominal pain to be determined. Other potential problems that may require surgery may also take time to become more apparent. Because of this, it is important that you follow all of the instructions below. HOME CARE INSTRUCTIONS   Rest as much as possible.   Do not eat solid food until your pain is gone.   While adults or children have pain: A diet of water, weak decaffeinated tea, broth or bouillon, gelatin, oral rehydration solutions (ORS), frozen ice pops, or ice chips may be helpful.   When pain is gone in adults or children: Start a light diet (dry toast, crackers, applesauce, or white rice). Increase the diet slowly as long as it does not bother you. Eat no dairy products (including cheese and eggs) and no spicy, fatty, fried, or high-fiber foods.   Use no alcohol, caffeine, or cigarettes.   Take your regular medicines unless your caregiver told you not to.   Take any prescribed medicine as directed.   Only take over-the-counter or prescription medicines for pain, discomfort, or fever as directed by your caregiver. Do not give aspirin to children.  If your caregiver has given you a follow-up appointment, it is very important to keep that  appointment. Not keeping the appointment could result in a permanent injury and/or lasting (chronic) pain and/or disability. If there is any problem keeping the appointment, you must call to reschedule.  SEEK IMMEDIATE MEDICAL CARE IF:   Your pain is not gone in 24 hours.   Your pain becomes worse, changes location, or feels different.   You or your child has an oral temperature above 102 F (38.9 C), not controlled by medicine.   Your baby is older than 3 months with a rectal temperature of 102 F (38.9 C) or higher.   Your baby is 72 months old or younger with a rectal temperature of 100.4 F (38 C) or higher.   You have shaking chills.   You keep throwing up (vomiting) or cannot drink liquids.   There is blood in your vomit or you see blood in your bowel movements.   Your bowel movements become dark or black.   You have frequent bowel movements.   Your bowel movements stop (become blocked) or you cannot pass gas.   You have bloody, frequent, or painful urination.   You have yellow discoloration in the skin or whites of the eyes.   Your stomach becomes bloated or bigger.   You have dizziness or fainting.   You have chest or back pain.  MAKE SURE YOU:   Understand these instructions.   Will watch your condition.   Will get help right away if you are not doing well or get worse.  Document Released: 01/26/2005 Document Revised: 10/08/2010 Document Reviewed: 12/24/2008  [employer] Patient Information 2012 [place], [place].

## 2010-12-19 NOTE — ED Provider Notes (Signed)
History  Scribed for [employer]. Colon Branch, MD, the patient was seen in room APA05. This chart was scribed by Hillery Hunter.   CSN: [id number] Arrival date & time: 12/19/2010  8:46 AM   First MD Initiated Contact with Patient 12/19/10 [phone]      Chief Complaint  Patient presents with  . Abdominal Pain   The history is provided by the patient.   Angelica Lopez is a 30 y.o. female who presents to the Emergency Department complaining of constant "crampy" abdominal pain since yesterday. She states that symptoms started yesterday feeling like a stomach ache in her upper abdomen, then gradually worsened and migrated down to her lower abdomen as it intensified and radiates to her lower back. She reports pain is worsened by taking deep breaths. She says she had one loose bowel movement two days ago, and then yesterday a bowel movement that was brown with streaks of blood. She denies associated nausea, vomiting, dysuria, fever, chills, cough. She states the only medication she has tried is Aeronaut[employer] (yesterday) which did not improve symptoms. She denies possibility of pregnancy due to having an IUD in place [place]). PCP: Dr. Tanya Nones.   Time seen: 08:59  Past Medical History  Diagnosis Date  . Hypertension     Past Surgical History  Procedure Date  . Cesarean section     No family history on file.  History  Substance Use Topics  . Smoking status: Current Everyday Smoker -- 1.0 packs/day    Types: Cigarettes  . Smokeless tobacco: Not on file  . Alcohol Use: No    Review of Systems  Constitutional: Negative for fever and chills.  HENT: Negative for trouble swallowing.   Respiratory: Negative for shortness of breath.   Cardiovascular: Negative for chest pain.  Gastrointestinal: Positive for abdominal pain, diarrhea (yesterday) and blood in stool. Negative for nausea and vomiting.  Genitourinary: Negative for dysuria.  Musculoskeletal: Negative for gait problem.  Skin:  Negative for rash.  Neurological: Negative for light-headedness.  Psychiatric/Behavioral: Negative for confusion.    Allergies  Codeine  Home Medications   Current Outpatient Rx  Name Route Sig Dispense Refill  . HYDROCHLOROTHIAZIDE 25 MG PO TABS Oral Take 25 mg by mouth daily.     . IBUPROFEN 200 MG PO TABS Oral Take 400 mg by mouth daily as needed. For headaches       Triage vitals: BP 148/90  Pulse 134  Temp(Src) 99.4 F (37.4 C) (Oral)  Resp 16  Ht 5\' 2"  (1.575 m)  Wt 160 lb (72.576 kg)  BMI 29.26 kg/m2  SpO2 100%  Physical Exam  Nursing note and vitals reviewed. Constitutional: She is oriented to person, place, and time. She appears well-developed and well-nourished. She appears distressed (mild).  HENT:  Head: Normocephalic and atraumatic.  Eyes:       tearful  Neck: Neck supple.  Cardiovascular: Normal rate, regular rhythm and normal heart sounds.  Exam reveals no friction rub.   No murmur heard.      No clicks  Pulmonary/Chest: Effort normal and breath sounds normal. No respiratory distress. She has no wheezes. She has no rales.  Abdominal: Soft. Bowel sounds are normal. She exhibits no distension and no mass. There is tenderness (across lower abdomen and suprapubic). There is no rebound and no guarding.  Musculoskeletal: She exhibits no edema and no tenderness.  Neurological: She is alert and oriented to person, place, and time.  Skin: Skin is warm and dry.  Psychiatric:  She has a normal mood and affect. Her behavior is normal.    ED Course  Procedures  Labs Reviewed  URINALYSIS, ROUTINE W REFLEX MICROSCOPIC - Abnormal; Notable for the following:    Appearance HAZY (*)    Specific Gravity, Urine <1.005 (*)    Leukocytes, UA SMALL (*)    All other components within normal limits  CBC - Abnormal; Notable for the following:    WBC 14.5 (*)    All other components within normal limits  DIFFERENTIAL - Abnormal; Notable for the following:    Neutrophils  Relative 84 (*)    Neutro Abs 12.2 (*)    Lymphocytes Relative 11 (*)    All other components within normal limits  BASIC METABOLIC PANEL - Abnormal; Notable for the following:    Sodium 132 (*)    Potassium 3.4 (*)    Glucose, Bld 110 (*)    All other components within normal limits  URINE MICROSCOPIC-ADD ON - Abnormal; Notable for the following:    Squamous Epithelial / LPF FEW (*)    Bacteria, UA FEW (*)    All other components within normal limits  PREGNANCY, URINE  LIPASE, BLOOD   Dg Abd Acute W/chest  12/19/2010  *RADIOLOGY REPORT*  Clinical Data: Abdominal pain, diarrhea, bright red blood in stool  ACUTE ABDOMEN SERIES (ABDOMEN 2 VIEW & CHEST 1 VIEW)  Comparison: None  Findings: Upper-normal size of cardiac silhouette. Mediastinal contours and pulmonary vascularity normal. Bronchitic changes without infiltrate or effusion. No pneumothorax. Scattered gas and stool in colon. Nonobstructive bowel gas pattern. No definite bowel wall thickening, bowel dilatation or free intraperitoneal air. IUD projects over pelvis. Bones unremarkable.  IMPRESSION: No acute abdominal findings. Mild bronchitic changes.  Original Report Authenticated By: Lollie Marrow, M.D.     OTHER DATA REVIEWED: Nursing notes, vital signs reviewed.   DIAGNOSTIC STUDIES: Oxygen Saturation is 100% on room air, normal by my interpretation.     ED COURSE / COORDINATION OF CARE: 09:08. Initial orders: Urinalysis with microscopic ; Pregnancy, urine ; sodium chloride 0.9 % bolus 1,000 mL ; CBC ; Differential ; Basic metabolic panel ; Lipase, blood ; DG Abd Acute W/Chest ; HYDROmorphone (DILAUDID) injection 1 mg ; ondansetron (ZOFRAN) injection 4 mg  10:18. Patient feels improved after medications, just returned from radiology. I discussed preliminary test results with patient and plan of treatment.  11:03. Independently viewed radiology films. Printed copy to show patient. Discussed radiology findings with patient at  bedside including plan of treatment for home care.    MDM  Patient presented with abdominal pain associatedwith diarrhea x 1 , nausea. Labs with elevated wbc c/w gastroenteritis. Xrays with large amount of gas and stool, non obstructive pattern. Patient given IVF, analgesics, antiemetics with improvement.Pt feels improved after observation and/or treatment in ED.Pt stable in ED with no significant deterioration in condition.The patient appears reasonably screened and/or stabilized for discharge and I doubt any other medical condition or other [facility] requiring further screening, evaluation, or treatment in the ED at this time prior to discharge.  MDM Reviewed: nursing note and vitals Interpretation: labs and x-ray    No diagnosis found.    I personally performed the services described in this documentation, which was scribed in my presence. The recorded information has been reviewed and considered.   Nicoletta Dress. Colon Branch, MD 12/19/10 1128

## 2011-01-09 ENCOUNTER — Emergency Department (HOSPITAL_COMMUNITY)
Admission: EM | Admit: 2011-01-09 | Discharge: 2011-01-09 | Disposition: A | Discharge status: Home / Self Care | Payer: Medicaid Other | Attending: Emergency Medicine | Admitting: Emergency Medicine

## 2011-01-09 ENCOUNTER — Emergency Department (HOSPITAL_COMMUNITY): Payer: Medicaid Other

## 2011-01-09 ENCOUNTER — Encounter (HOSPITAL_COMMUNITY): Payer: Self-pay | Admitting: Emergency Medicine

## 2011-01-09 DIAGNOSIS — N730 Acute parametritis and pelvic cellulitis: Secondary | ICD-10-CM | POA: Insufficient documentation

## 2011-01-09 DIAGNOSIS — Z975 Presence of (intrauterine) contraceptive device: Secondary | ICD-10-CM | POA: Insufficient documentation

## 2011-01-09 DIAGNOSIS — I10 Essential (primary) hypertension: Secondary | ICD-10-CM | POA: Insufficient documentation

## 2011-01-09 LAB — URINE MICROSCOPIC-ADD ON

## 2011-01-09 LAB — BASIC METABOLIC PANEL
BUN: 5 mg/dL — ABNORMAL LOW (ref 6–23)
Chloride: 101 mEq/L (ref 96–112)
Glucose, Bld: 103 mg/dL — ABNORMAL HIGH (ref 70–99)
Potassium: 3.6 mEq/L (ref 3.5–5.1)

## 2011-01-09 LAB — URINALYSIS, ROUTINE W REFLEX MICROSCOPIC
Glucose, UA: NEGATIVE mg/dL
Nitrite: NEGATIVE
Specific Gravity, Urine: 1.025 (ref 1.005–1.030)
pH: 6 (ref 5.0–8.0)

## 2011-01-09 LAB — CBC
Hemoglobin: 13.2 g/dL (ref 12.0–15.0)
Platelets: 365 10*3/uL (ref 150–400)
RBC: 4.5 MIL/uL (ref 3.87–5.11)
WBC: 17.8 10*3/uL — ABNORMAL HIGH (ref 4.0–10.5)

## 2011-01-09 LAB — WET PREP, GENITAL

## 2011-01-09 LAB — DIFFERENTIAL
Lymphs Abs: 3 10*3/uL (ref 0.7–4.0)
Monocytes Relative: 7 % (ref 3–12)
Neutro Abs: 13.3 10*3/uL — ABNORMAL HIGH (ref 1.7–7.7)
Neutrophils Relative %: 75 % (ref 43–77)

## 2011-01-09 MED ORDER — HYDROCODONE-ACETAMINOPHEN 5-325 MG PO TABS: ORAL_TABLET | ORAL | Status: AC

## 2011-01-09 MED ORDER — DOXYCYCLINE HYCLATE 100 MG PO CAPS: 100.0000 mg | ORAL_CAPSULE | Freq: Two times a day (BID) | ORAL | Status: AC

## 2011-01-09 MED ORDER — CEFTRIAXONE SODIUM 500 MG IJ SOLR
500.0000 mg | Freq: Once | INTRAMUSCULAR | Status: AC
  Administered 2011-01-09: 500 mg via INTRAMUSCULAR
  Filled 2011-01-09: qty 500

## 2011-01-09 MED ORDER — LIDOCAINE HCL (PF) 1 % IJ SOLN
INTRAMUSCULAR | Status: AC
  Administered 2011-01-09: 2.1 mL
  Filled 2011-01-09: qty 5

## 2011-01-09 MED ORDER — OXYCODONE-ACETAMINOPHEN 5-325 MG PO TABS
1.0000 | ORAL_TABLET | Freq: Once | ORAL | Status: AC
Start: 2011-01-09 — End: 2011-01-09
  Administered 2011-01-09: 1 via ORAL
  Filled 2011-01-09: qty 1

## 2011-01-09 MED ORDER — METRONIDAZOLE 500 MG PO TABS: 500.0000 mg | ORAL_TABLET | Freq: Two times a day (BID) | ORAL | Status: AC

## 2011-01-09 NOTE — ED Notes (Signed)
Pt resting comfortably at this time.

## 2011-01-09 NOTE — ED Notes (Signed)
Pt c/o pain in her left groin area. Pt states that she has not been urinating as much as normal. Also c/o Angelica Lopez vaginal discharge. No tenderness to palpation noted in abdomen. Denies pain with movement. Alert and oriented x 3. Skin warm and dry. Color pink. Breath sounds clear and equal bilaterally.

## 2011-01-09 NOTE — ED Notes (Signed)
Pt c/o pelvic pain with brown vaginal d/c x 4 days. Denies n/v/d.

## 2011-01-09 NOTE — ED Provider Notes (Signed)
History     CSN: [id number] Arrival date & time: 01/09/2011  9:48 AM   First MD Initiated Contact with Patient 01/09/11 1000      Chief Complaint  Patient presents with  . Pelvic Pain    (Consider location/radiation/quality/duration/timing/severity/associated sxs/prior treatment) HPI Comments: Patient c/o left lower pelvic pain, and vaginal discharge for 4 days.  She denies fever, vomiting or new sexual partners.    Patient is a 30 y.o. female presenting with vaginal discharge. The history is provided by the patient.  Vaginal Discharge This is a new problem. The current episode started in the past 7 days. The problem occurs constantly. The problem has been unchanged. Pertinent negatives include no abdominal pain, arthralgias, chest pain, chills, coughing, fatigue, fever, myalgias, nausea, neck pain, numbness, rash, sore throat, swollen glands, urinary symptoms, vomiting or weakness. Associated symptoms comments: Pelvic pain. The symptoms are aggravated by nothing. She has tried nothing for the symptoms. The treatment provided no relief.    Past Medical History  Diagnosis Date  . Hypertension     Past Surgical History  Procedure Date  . Cesarean section     History reviewed. No pertinent family history.  History  Substance Use Topics  . Smoking status: Current Everyday Smoker -- 1.0 packs/day    Types: Cigarettes  . Smokeless tobacco: Not on file  . Alcohol Use: No    OB History    Grav Para Term Preterm Abortions TAB SAB Ect Mult Living                  Review of Systems  Constitutional: Negative for fever, chills and fatigue.  HENT: Negative for sore throat, trouble swallowing, neck pain and neck stiffness.   Respiratory: Negative for cough, shortness of breath and wheezing.   Cardiovascular: Negative for chest pain and palpitations.  Gastrointestinal: Negative for nausea, vomiting, abdominal pain and blood in stool.  Genitourinary: Positive for vaginal  discharge and vaginal pain. Negative for dysuria, hematuria, flank pain, decreased urine volume, vaginal bleeding and genital sores.  Musculoskeletal: Negative for myalgias, back pain and arthralgias.  Skin: Negative.  Negative for rash.  Neurological: Negative for dizziness, weakness and numbness.  Hematological: Does not bruise/bleed easily.  All other systems reviewed and are negative.    Allergies  Codeine  Home Medications   Current Outpatient Rx  Name Route Sig Dispense Refill  . HYDROCHLOROTHIAZIDE 25 MG PO TABS Oral Take 25 mg by mouth daily.     . IBUPROFEN 200 MG PO TABS Oral Take 400 mg by mouth daily as needed. For headaches       BP 153/100  Pulse 118  Temp 98.6 F (37 C)  Resp 20  Ht 5\' 2"  (1.575 m)  Wt 160 lb (72.576 kg)  BMI 29.26 kg/m2  SpO2 100%  Physical Exam  Nursing note and vitals reviewed. Constitutional: She is oriented to person, place, and time. She appears well-developed and well-nourished. No distress.  HENT:  Head: Normocephalic and atraumatic.  Mouth/Throat: Oropharynx is clear and moist.  Neck: Neck supple.  Cardiovascular: Normal rate, regular rhythm and normal heart sounds.   Pulmonary/Chest: Effort normal and breath sounds normal. No respiratory distress. She exhibits no tenderness.  Abdominal: Soft. She exhibits no distension and no mass. There is no tenderness. There is no rebound and no guarding.  Genitourinary: Uterus normal. Cervix exhibits motion tenderness and discharge. Cervix exhibits no friability. Right adnexum displays no mass, no tenderness and no fullness. Left adnexum displays  tenderness. Left adnexum displays no mass and no fullness. Vaginal discharge found.       Moderate yellow discharge in the vaginal canal.  CMT.  No vagianl bleeding.  IUD in place.  Musculoskeletal: Normal range of motion. She exhibits no edema and no tenderness.  Neurological: She is alert and oriented to person, place, and time. No cranial nerve  deficit. She exhibits normal muscle tone. Coordination normal.  Skin: Skin is warm and dry.  Psychiatric: She has a normal mood and affect.    ED Course  Procedures (including critical care time)  Labs Reviewed  URINALYSIS, ROUTINE W REFLEX MICROSCOPIC - Abnormal; Notable for the following:    Bilirubin Urine SMALL (*)    Ketones, ur TRACE (*)    Protein, ur TRACE (*)    Leukocytes, UA TRACE (*)    All other components within normal limits  URINE MICROSCOPIC-ADD ON - Abnormal; Notable for the following:    Squamous Epithelial / LPF MANY (*)    Bacteria, UA FEW (*)    All other components within normal limits  WET PREP, GENITAL - Abnormal; Notable for the following:    Clue Cells, Wet Prep FEW (*)    WBC, Wet Prep HPF POC MANY (*)    All other components within normal limits  CBC - Abnormal; Notable for the following:    WBC 17.8 (*)    All other components within normal limits  DIFFERENTIAL - Abnormal; Notable for the following:    Neutro Abs 13.3 (*)    Monocytes Absolute 1.3 (*)    All other components within normal limits  BASIC METABOLIC PANEL - Abnormal; Notable for the following:    Glucose, Bld 103 (*)    BUN 5 (*)    Creatinine, Ser 0.45 (*)    All other components within normal limits  PREGNANCY, URINE  GC/CHLAMYDIA PROBE AMP, GENITAL   US Transvaginal Non-ob  01/09/2011  *RADIOLOGY REPORT*  Clinical Data: Pelvic pain.  TRANSABDOMINAL AND TRANSVAGINAL ULTRASOUND OF PELVIS  Technique:  Both transabdominal and transvaginal ultrasound examinations of the pelvis were performed including evaluation of the uterus, ovaries, adnexal regions, and pelvic cul-de-sac.  Comparison: None.  Findings:  Uterus: 8.2 x 4.6 x 4.0 cm.  Normal echotexture.  No focal abnormality.  Endometrium: Normal appearance and thickness, 7 mm.  IUD in place.  Right Ovary: 5.0 x 4.6 x 2.8 cm.  3.2 cm complex hypoechoic area, likely hemorrhagic cyst.  Left Ovary: 7.2 x 3.0 x 3.1 cm.  Two complex  hypoechoic areas within the left ovary, both measuring maximally 2.7 cm, also presumably hemorrhagic cysts.  Other Findings:  No free fluid.  IMPRESSION: Complex hypoechoic areas in the ovaries bilaterally as described above, presumably hemorrhagic cysts.  Endometriomas felt less likely but not excluded.  IUD in place.  Original Report Authenticated By: Cyndie Chime, M.D.   US Pelvis Complete  01/09/2011  *RADIOLOGY REPORT*  Clinical Data: Pelvic pain.  TRANSABDOMINAL AND TRANSVAGINAL ULTRASOUND OF PELVIS  Technique:  Both transabdominal and transvaginal ultrasound examinations of the pelvis were performed including evaluation of the uterus, ovaries, adnexal regions, and pelvic cul-de-sac.  Comparison: None.  Findings:  Uterus: 8.2 x 4.6 x 4.0 cm.  Normal echotexture.  No focal abnormality.  Endometrium: Normal appearance and thickness, 7 mm.  IUD in place.  Right Ovary: 5.0 x 4.6 x 2.8 cm.  3.2 cm complex hypoechoic area, likely hemorrhagic cyst.  Left Ovary: 7.2 x 3.0 x 3.1 cm.  Two complex hypoechoic areas within the left ovary, both measuring maximally 2.7 cm, also presumably hemorrhagic cysts.  Other Findings:  No free fluid.  IMPRESSION: Complex hypoechoic areas in the ovaries bilaterally as described above, presumably hemorrhagic cysts.  Endometriomas felt less likely but not excluded.  IUD in place.  Original Report Authenticated By: Cyndie Chime, M.D.        MDM    1:41 PM patient is feeling better, NAD.  No fever, vomiting or back pain.  Non-toxic appearing.  Has left pelvic pain with purulent vaginal d/c and CMT.  No TOA seen on [place].  Will treat for PID and I have advised her to return here in 1-2 days for recheck if the symptoms worsen.  GC and Chlamydia cultures pending  Pt feels improved after observation and/or treatment in ED.   Patient / Family / Caregiver understand and agree with initial ED impression and plan with expectations set for ED visit.      Ryeleigh Santore L. Emiliana Blaize,  [place] 01/10/11 2310

## 2011-01-11 NOTE — ED Provider Notes (Signed)
Medical screening examination/treatment/procedure(s) were performed by non-physician practitioner and as supervising physician I was immediately available for consultation/collaboration.  [place] results discussed, will Rx as if PID infection, with IV antibiotics and close followup.    Shelda Jakes, MD 01/11/11 [phone]

## 2011-01-13 LAB — GC/CHLAMYDIA PROBE AMP, GENITAL: GC Probe Amp, Genital: POSITIVE — AB

## 2011-06-17 ENCOUNTER — Ambulatory Visit
Admission: RE | Admit: 2011-06-17 | Discharge: 2011-06-17 | Disposition: A | Discharge status: Home / Self Care | Payer: Medicaid Other | Source: Ambulatory Visit | Attending: Physician Assistant | Admitting: Physician Assistant

## 2011-06-17 ENCOUNTER — Other Ambulatory Visit: Payer: Self-pay | Admitting: Physician Assistant

## 2011-06-17 DIAGNOSIS — M549 Dorsalgia, unspecified: Secondary | ICD-10-CM

## 2011-06-27 ENCOUNTER — Encounter (HOSPITAL_COMMUNITY): Payer: Self-pay | Admitting: Emergency Medicine

## 2011-06-27 ENCOUNTER — Emergency Department (HOSPITAL_COMMUNITY)
Admission: EM | Admit: 2011-06-27 | Discharge: 2011-06-27 | Disposition: A | Discharge status: Home / Self Care | Payer: Medicaid Other | Attending: Emergency Medicine | Admitting: Emergency Medicine

## 2011-06-27 DIAGNOSIS — K644 Residual hemorrhoidal skin tags: Secondary | ICD-10-CM | POA: Insufficient documentation

## 2011-06-27 DIAGNOSIS — K6289 Other specified diseases of anus and rectum: Secondary | ICD-10-CM | POA: Insufficient documentation

## 2011-06-27 MED ORDER — LIDOCAINE HCL 2 % EX GEL: Freq: Once | CUTANEOUS | Status: DC

## 2011-06-27 MED ORDER — HYDROCORTISONE ACETATE 25 MG RE SUPP: 25.0000 mg | Freq: Two times a day (BID) | RECTAL | Status: AC

## 2011-06-27 MED ORDER — HYDROCODONE-ACETAMINOPHEN 5-325 MG PO TABS: 1.0000 | ORAL_TABLET | ORAL | Status: AC | PRN

## 2011-06-27 MED ORDER — LIDOCAINE VISCOUS 2 % MT SOLN
OROMUCOSAL | Status: AC
  Administered 2011-06-27: 15 mL
  Filled 2011-06-27: qty 15

## 2011-06-27 NOTE — ED Provider Notes (Signed)
History     CSN: [id number]  Arrival date & time 06/27/11  0846   First MD Initiated Contact with Patient 06/27/11 [phone]      Chief Complaint  Patient presents with  . Rectal Pain    (Consider location/radiation/quality/duration/timing/severity/associated sxs/prior treatment) HPI Comments: Angelica Lopez presents for treatment of rectal pain and hemorrhoids which is been present for the past week and getting worse.  She has tried OTC preparation H. wipes which give temporary relief.  She denies constipation, does have a history of hemorrhoids but not in over 2 years since she was pregnant with her last child.  She denies fevers or chills.  She has had loose stools for the past several days, which she often has when she eats dairy products.  Pain is throbbing and constant without relief.  The history is provided by the patient.    Past Medical History  Diagnosis Date  . Hypertension     Past Surgical History  Procedure Date  . Cesarean section     History reviewed. No pertinent family history.  History  Substance Use Topics  . Smoking status: Current Everyday Smoker -- 1.0 packs/day    Types: Cigarettes  . Smokeless tobacco: Not on file  . Alcohol Use: No    OB History    Grav Para Term Preterm Abortions TAB SAB Ect Mult Living                  Review of Systems  Constitutional: Negative for fever.  HENT: Negative for congestion, sore throat and neck pain.   Eyes: Negative.   Respiratory: Negative for chest tightness and shortness of breath.   Cardiovascular: Negative for chest pain.  Gastrointestinal: Positive for rectal pain. Negative for nausea, vomiting, abdominal pain and constipation.  Genitourinary: Negative.  Negative for dysuria and flank pain.  Musculoskeletal: Negative for joint swelling and arthralgias.  Skin: Negative.  Negative for rash and wound.  Neurological: Negative for dizziness, weakness, light-headedness, numbness and headaches.    Hematological: Negative.   Psychiatric/Behavioral: Negative.     Allergies  Codeine  Home Medications   Current Outpatient Rx  Name Route Sig Dispense Refill  . IBUPROFEN 200 MG PO TABS Oral Take 400 mg by mouth daily as needed. For headaches     . LEVONORGESTREL 20 MCG/24HR IU IUD Intrauterine 1 each by Intrauterine route once.    Marland Kitchen HYDROCODONE-ACETAMINOPHEN 5-325 MG PO TABS Oral Take 1 tablet by mouth every 4 (four) hours as needed for pain. 20 tablet 0  . HYDROCORTISONE ACETATE 25 MG RE SUPP Rectal Place 1 suppository (25 mg total) rectally 2 (two) times daily. 12 suppository 0    BP 144/95  Pulse 120  Temp(Src) 98.2 F (36.8 C) (Oral)  Resp 18  Ht 5' (1.524 m)  Wt 170 lb (77.111 kg)  BMI 33.20 kg/m2  SpO2 100%  Physical Exam  Nursing note and vitals reviewed. Constitutional: She appears well-developed and well-nourished.  HENT:  Head: Normocephalic and atraumatic.  Eyes: Conjunctivae are normal.  Neck: Normal range of motion.  Cardiovascular: Normal rate, regular rhythm, normal heart sounds and intact distal pulses.   Pulmonary/Chest: Effort normal and breath sounds normal. She has no wheezes.  Abdominal: Soft. Bowel sounds are normal. There is no tenderness.  Genitourinary: Rectal exam shows external hemorrhoid.       External hemorrhoid without thrombosis.  Musculoskeletal: Normal range of motion.  Neurological: She is alert.  Skin: Skin is warm and dry.  Psychiatric: She has a normal mood and affect.    ED Course  Procedures (including critical care time)  Labs Reviewed - No data to display No results found.   1. External hemorrhoid    PROCEDURE:  Topical lidocaine 2% jelly was applied to external hemorrhoid, gentle pressure was then applied for proximally 5 minutes and complete reduction of hemorrhoid was obtained with patient receiving significant improvement in pain symptoms.   MDM  Patient was prescribed Anusol suppositories.  She was instructed  to sit in a warm bath for 15 minutes twice daily, followed by gentle reduction, gloves given.  Following this procedure applied the Anusol suppository.  Expect gradual reduction in the size of this hemorrhoid.  Recheck by PCP if not improved over the next week.  Hydrocodone was also prescribed, but caution given about possible constipation.  Encouraged to use sparingly and if she uses she should also use a stool softener as well.        Burgess Amor, PA 06/27/11 1024

## 2011-06-27 NOTE — ED Notes (Signed)
Pt c/o rectal pain x 7 days.

## 2011-06-27 NOTE — Discharge Instructions (Signed)
Hemorrhoids Hemorrhoids are enlarged (dilated) veins around the rectum. There are 2 types of hemorrhoids, and the type of hemorrhoid is determined by its location. Internal hemorrhoids occur in the veins just inside the rectum.They are usually not painful, but they may bleed.However, they may poke through to the outside and become irritated and painful. External hemorrhoids involve the veins outside the anus and can be felt as a painful swelling or hard lump near the anus.They are often itchy and may crack and bleed. Sometimes clots will form in the veins. This makes them swollen and painful. These are called thrombosed hemorrhoids. CAUSES Causes of hemorrhoids include:  Pregnancy. This increases the pressure in the hemorrhoidal veins.   Constipation.   Straining to have a bowel movement.   Obesity.   Heavy lifting or other activity that caused you to strain.  TREATMENT Most of the time hemorrhoids improve in 1 to 2 weeks. However, if symptoms do not seem to be getting better or if you have a lot of rectal bleeding, your caregiver may perform a procedure to help make the hemorrhoids get smaller or remove them completely.Possible treatments include:  Rubber band ligation. A rubber band is placed at the base of the hemorrhoid to cut off the circulation.   Sclerotherapy. A chemical is injected to shrink the hemorrhoid.   Infrared light therapy. Tools are used to burn the hemorrhoid.   Hemorrhoidectomy. This is surgical removal of the hemorrhoid.  HOME CARE INSTRUCTIONS   Increase fiber in your diet. Ask your caregiver about using fiber supplements.   Drink enough water and fluids to keep your urine clear or pale yellow.   Exercise regularly.   Go to the bathroom when you have the urge to have a bowel movement. Do not wait.   Avoid straining to have bowel movements.   Keep the anal area dry and clean.   Only take over-the-counter or prescription medicines for pain, discomfort,  or fever as directed by your caregiver.  If your hemorrhoids are thrombosed:  Take warm sitz baths for 20 to 30 minutes, 3 to 4 times per day.   If the hemorrhoids are very tender and swollen, place ice packs on the area as tolerated. Using ice packs between sitz baths may be helpful. Fill a plastic bag with ice. Place a towel between the bag of ice and your skin.   Medicated creams and suppositories may be used or applied as directed.   Do not use a donut-shaped pillow or sit on the toilet for long periods. This increases blood pooling and pain.  SEEK MEDICAL CARE IF:   You have increasing pain and swelling that is not controlled with your medicine.   You have uncontrolled bleeding.   You have difficulty or you are unable to have a bowel movement.   You have pain or inflammation outside the area of the hemorrhoids.   You have chills or an oral temperature above 102 F (38.9 C).  MAKE SURE YOU:   Understand these instructions.   Will watch your condition.   Will get help right away if you are not doing well or get worse.  Document Released: 01/24/2000 Document Revised: 01/15/2011 Document Reviewed: 05/31/2007 [facility] Patient Information 2012 [place], [place].   As discussed do a warm tub soaks for 10-15 minutes, followed by gentle pressure to reduce the hemorrhoid as was done here in emergency department.  Next apply Anusol suppository, do this twice daily.  Expect gradual reduction in the size of his  hemorrhoid, however get rechecked by your primary doctor if this is not getting better smaller over the next week.  You may use the hydrocodone prescribed for pain however this may cause constipation so use this cautiously and consider using a stool softener if you take this medicine.  Also you should not drive within 4 hours of taking hydrocodone because it will also make you drowsy.

## 2011-06-28 NOTE — ED Provider Notes (Signed)
Medical screening examination/treatment/procedure(s) were performed by non-physician practitioner and as supervising physician I was immediately available for consultation/collaboration.  Ervine Witucki W. Deannah Rossi, MD 06/28/11 0721 

## 2011-08-06 ENCOUNTER — Encounter (HOSPITAL_COMMUNITY): Payer: Self-pay | Admitting: *Deleted

## 2011-08-06 ENCOUNTER — Emergency Department (HOSPITAL_COMMUNITY)
Admission: EM | Admit: 2011-08-06 | Discharge: 2011-08-06 | Disposition: A | Discharge status: Home / Self Care | Payer: Medicaid Other | Attending: Emergency Medicine | Admitting: Emergency Medicine

## 2011-08-06 DIAGNOSIS — M545 Low back pain, unspecified: Secondary | ICD-10-CM

## 2011-08-06 DIAGNOSIS — I10 Essential (primary) hypertension: Secondary | ICD-10-CM | POA: Insufficient documentation

## 2011-08-06 DIAGNOSIS — F17200 Nicotine dependence, unspecified, uncomplicated: Secondary | ICD-10-CM | POA: Insufficient documentation

## 2011-08-06 MED ORDER — IBUPROFEN 800 MG PO TABS: 800.0000 mg | ORAL_TABLET | Freq: Three times a day (TID) | ORAL | Status: AC

## 2011-08-06 MED ORDER — HYDROCODONE-ACETAMINOPHEN 5-325 MG PO TABS: ORAL_TABLET | ORAL | Status: AC

## 2011-08-06 MED ORDER — CYCLOBENZAPRINE HCL 10 MG PO TABS: 10.0000 mg | ORAL_TABLET | Freq: Three times a day (TID) | ORAL | Status: AC | PRN

## 2011-08-06 NOTE — ED Notes (Signed)
Pt c/o lower back pain since last night. 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

## 2011-08-06 NOTE — ED Notes (Signed)
Pt c/o lower back that became worse last night, pt states that she was recently diagnosed with Schaumann Disease and it effects her back area. Pt denies any injury states that she is not able to continue with her activities involving her daughter due to the back pain, ie' carrying her daughter

## 2011-08-06 NOTE — ED Provider Notes (Signed)
History     CSN: [id number]  Arrival date & time 08/06/11  1045   First MD Initiated Contact with Patient 08/06/11 1125      Chief Complaint  Patient presents with  . Back Pain    (Consider location/radiation/quality/duration/timing/severity/associated sxs/prior treatment) HPI Comments: Patient c/o lower back pain intermittently since being recently diagnosed with Schmorrel's nodes on a recent x-ray.  States he back began hurting on the evening prior to ed arrival.  Thinks she may have "twisted" her back.  She denies falling.  She also denies incontinence, urinary symptoms, weakness or perineal numbness  Patient is a 31 y.o. female presenting with back pain. The history is provided by the patient.  Back Pain  This is a new problem. The current episode started yesterday. The problem occurs constantly. The problem has not changed since onset.The pain is associated with twisting. The pain is present in the lumbar spine. The quality of the pain is described as aching. The pain does not radiate. The pain is moderate. The symptoms are aggravated by bending, twisting and certain positions. Pertinent negatives include no chest pain, no fever, no numbness, no abdominal swelling, no bowel incontinence, no perianal numbness, no bladder incontinence, no dysuria, no pelvic pain, no leg pain, no paresthesias, no paresis and no weakness. She has tried NSAIDs for the symptoms. The treatment provided no relief.    Past Medical History  Diagnosis Date  . Hypertension     Past Surgical History  Procedure Date  . Cesarean section     History reviewed. No pertinent family history.  History  Substance Use Topics  . Smoking status: Current Everyday Smoker -- 1.0 packs/day    Types: Cigarettes  . Smokeless tobacco: Not on file  . Alcohol Use: No    OB History    Grav Para Term Preterm Abortions TAB SAB Ect Mult Living                  Review of Systems  Constitutional: Negative for fever and  chills.  HENT: Negative for trouble swallowing, neck pain and neck stiffness.   Respiratory: Negative for cough, shortness of breath and wheezing.   Cardiovascular: Negative for chest pain and palpitations.  Gastrointestinal: Negative for vomiting and bowel incontinence.  Genitourinary: Negative for bladder incontinence, dysuria, hematuria, flank pain, difficulty urinating and pelvic pain.  Musculoskeletal: Positive for back pain. Negative for myalgias and arthralgias.  Skin: Negative for rash.  Neurological: Negative for dizziness, weakness, numbness and paresthesias.  Hematological: Does not bruise/bleed easily.  All other systems reviewed and are negative.    Allergies  Codeine  Home Medications   Current Outpatient Rx  Name Route Sig Dispense Refill  . IBUPROFEN 200 MG PO TABS Oral Take 400 mg by mouth daily as needed. For headaches     . LEVONORGESTREL 20 MCG/24HR IU IUD Intrauterine 1 each by Intrauterine route once.      BP 135/91  Pulse 111  Temp 98.1 F (36.7 C) (Oral)  Resp 18  Ht 5\' 2"  (1.575 m)  Wt 140 lb (63.504 kg)  BMI 25.61 kg/m2  SpO2 98%  Physical Exam  Nursing note and vitals reviewed. Constitutional: She is oriented to person, place, and time. She appears well-developed and well-nourished. No distress.  HENT:  Head: Normocephalic and atraumatic.  Neck: Normal range of motion. Neck supple.  Cardiovascular: Normal rate, regular rhythm and intact distal pulses.   No murmur heard. Pulmonary/Chest: Effort normal and breath sounds normal.  Musculoskeletal: She exhibits tenderness. She exhibits no edema.       Lumbar back: She exhibits tenderness and pain. She exhibits normal range of motion, no swelling, no deformity, no laceration and normal pulse.       Back:  Neurological: She is alert and oriented to person, place, and time. No cranial nerve deficit or sensory deficit. She exhibits normal muscle tone. Coordination and gait normal.  Reflex Scores:       Patellar reflexes are 2+ on the right side and 2+ on the left side.      Achilles reflexes are 2+ on the right side and 2+ on the left side. Skin: Skin is warm and dry.    ED Course  Procedures (including critical care time)  Labs Reviewed - No data to display      MDM    Patient has ttp of the lumbar paraspinal muscles.  No focal neuro deficits on exam.  Ambulates with a steady gait.  Hx of same    Patient / Family / Caregiver understand and agree with initial ED impression and plan with expectations set for ED visit. Pt stable in ED with no significant deterioration in condition. Pt feels improved after observation and/or treatment in ED.    Prescrbed: Flexeril Ibuprofen norco #20    Hurshell Dino L. [place], [place] 08/09/11 [phone]

## 2011-08-06 NOTE — Discharge Instructions (Signed)

## 2011-08-09 NOTE — ED Provider Notes (Signed)
Medical screening examination/treatment/procedure(s) were performed by non-physician practitioner and as supervising physician I was immediately available for consultation/collaboration.  Clydine Parkison, MD, FACEP   Severino Paolo L Zaynab Chipman, MD 08/09/11 1559 

## 2011-10-09 ENCOUNTER — Emergency Department (HOSPITAL_COMMUNITY)
Admission: EM | Admit: 2011-10-09 | Discharge: 2011-10-09 | Disposition: A | Discharge status: Home / Self Care | Payer: Medicaid Other | Attending: Emergency Medicine | Admitting: Emergency Medicine

## 2011-10-09 ENCOUNTER — Encounter (HOSPITAL_COMMUNITY): Payer: Self-pay | Admitting: *Deleted

## 2011-10-09 DIAGNOSIS — Z886 Allergy status to analgesic agent status: Secondary | ICD-10-CM | POA: Insufficient documentation

## 2011-10-09 DIAGNOSIS — M4200 Juvenile osteochondrosis of spine, site unspecified: Secondary | ICD-10-CM | POA: Insufficient documentation

## 2011-10-09 DIAGNOSIS — M4000 Postural kyphosis, site unspecified: Secondary | ICD-10-CM | POA: Insufficient documentation

## 2011-10-09 DIAGNOSIS — M545 Low back pain, unspecified: Secondary | ICD-10-CM | POA: Insufficient documentation

## 2011-10-09 DIAGNOSIS — I10 Essential (primary) hypertension: Secondary | ICD-10-CM | POA: Insufficient documentation

## 2011-10-09 DIAGNOSIS — F17200 Nicotine dependence, unspecified, uncomplicated: Secondary | ICD-10-CM | POA: Insufficient documentation

## 2011-10-09 HISTORY — DX: Juvenile osteochondrosis of spine, site unspecified: M42.00

## 2011-10-09 HISTORY — DX: Unspecified kyphosis, site unspecified: M40.209

## 2011-10-09 LAB — URINALYSIS, ROUTINE W REFLEX MICROSCOPIC
Bilirubin Urine: NEGATIVE
Glucose, UA: NEGATIVE mg/dL
Hgb urine dipstick: NEGATIVE
Ketones, ur: NEGATIVE mg/dL
Leukocytes, UA: NEGATIVE
Nitrite: NEGATIVE
Protein, ur: NEGATIVE mg/dL
Specific Gravity, Urine: 1.03 (ref 1.005–1.030)
Urobilinogen, UA: 0.2 mg/dL (ref 0.0–1.0)
pH: 6 (ref 5.0–8.0)

## 2011-10-09 LAB — POCT PREGNANCY, URINE: Preg Test, Ur: NEGATIVE

## 2011-10-09 MED ORDER — CYCLOBENZAPRINE HCL 10 MG PO TABS: 10.0000 mg | ORAL_TABLET | Freq: Three times a day (TID) | ORAL | Status: AC | PRN

## 2011-10-09 MED ORDER — HYDROCODONE-ACETAMINOPHEN 5-325 MG PO TABS: ORAL_TABLET | ORAL | Status: AC

## 2011-10-09 NOTE — ED Provider Notes (Signed)
History     CSN: [id number]  Arrival date & time 10/09/11  4098   First MD Initiated Contact with Patient 10/09/11 [phone]      Chief Complaint  Patient presents with  . Back Pain    (Consider location/radiation/quality/duration/timing/severity/associated sxs/prior treatment) HPI Comments: Patient c/o acute on chronic low back pain that became worse last evening after excessive walking and lifting her child.  Describes the pain as sharp and "feels like someone has a fist in my back".  States the pain is worse with certain movements and improves with rest.  Also states that she is suppose to see a rheumatologist in [place], but does not have transportation.  She denies incontinence, dysuria, numbness or weakness of the extremities, or abd pain.  Has been taking ibuprofen recently with mild relief.    Patient is a 31 y.o. female presenting with back pain. The history is provided by the patient.  Back Pain  This is a recurrent problem. The current episode started yesterday. The problem occurs constantly. The problem has not changed since onset.The pain is associated with lifting heavy objects and twisting. The pain is present in the lumbar spine. The quality of the pain is described as aching. The pain does not radiate. The pain is moderate. The symptoms are aggravated by bending, twisting and certain positions. The pain is the same all the time. Pertinent negatives include no chest pain, no fever, no numbness, no abdominal pain, no abdominal swelling, no bowel incontinence, no perianal numbness, no bladder incontinence, no dysuria, no pelvic pain, no leg pain, no paresthesias, no paresis, no tingling and no weakness. She has tried analgesics, NSAIDs and muscle relaxants for the symptoms. The treatment provided moderate relief.    Past Medical History  Diagnosis Date  . Hypertension   . Kyphosis   . Scheuermann's disease     Past Surgical History  Procedure Date  . Cesarean section      No family history on file.  History  Substance Use Topics  . Smoking status: Current Everyday Smoker -- 1.0 packs/day    Types: Cigarettes  . Smokeless tobacco: Not on file  . Alcohol Use: No    OB History    Grav Para Term Preterm Abortions TAB SAB Ect Mult Living                  Review of Systems  Constitutional: Negative for fever.  Respiratory: Negative for shortness of breath.   Cardiovascular: Negative for chest pain.  Gastrointestinal: Negative for vomiting, abdominal pain, constipation and bowel incontinence.  Genitourinary: Negative for bladder incontinence, dysuria, hematuria, flank pain, decreased urine volume, difficulty urinating and pelvic pain.       No perineal numbness or incontinence of urine or feces  Musculoskeletal: Positive for back pain. Negative for joint swelling.  Skin: Negative for rash.  Neurological: Negative for tingling, weakness, numbness and paresthesias.  All other systems reviewed and are negative.    Allergies  Codeine  Home Medications   Current Outpatient Rx  Name Route Sig Dispense Refill  . IBUPROFEN 200 MG PO TABS Oral Take 400 mg by mouth daily as needed. For headaches     . LEVONORGESTREL 20 MCG/24HR IU IUD Intrauterine 1 each by Intrauterine route once.      BP 109/79  Pulse 114  Temp 98.1 F (36.7 C) (Oral)  Resp 16  Ht 5\' 1"  (1.549 m)  Wt 150 lb (68.04 kg)  BMI 28.34 kg/m2  SpO2 100%  Physical Exam  Nursing note and vitals reviewed. Constitutional: She is oriented to person, place, and time. She appears well-developed and well-nourished. No distress.  HENT:  Head: Normocephalic and atraumatic.  Neck: Normal range of motion. Neck supple.  Cardiovascular: Normal rate, regular rhythm and intact distal pulses.   No murmur heard. Pulmonary/Chest: Effort normal and breath sounds normal.  Musculoskeletal: She exhibits tenderness. She exhibits no edema.       Lumbar back: She exhibits tenderness, bony tenderness  and pain. She exhibits normal range of motion, no swelling, no deformity, no laceration and normal pulse.       Back:  Neurological: She is alert and oriented to person, place, and time. No cranial nerve deficit or sensory deficit. She exhibits normal muscle tone. Coordination and gait normal.  Reflex Scores:      Patellar reflexes are 2+ on the right side and 2+ on the left side.      Achilles reflexes are 2+ on the right side and 2+ on the left side. Skin: Skin is warm and dry.    ED Course  Procedures (including critical care time)  Results for orders placed during the hospital encounter of 10/09/11  URINALYSIS, ROUTINE W REFLEX MICROSCOPIC      Component Value Range   Color, Urine YELLOW  YELLOW   APPearance CLEAR  CLEAR   Specific Gravity, Urine 1.030  1.005 - 1.030   pH 6.0  5.0 - 8.0   Glucose, UA NEGATIVE  NEGATIVE mg/dL   Hgb urine dipstick NEGATIVE  NEGATIVE   Bilirubin Urine NEGATIVE  NEGATIVE   Ketones, ur NEGATIVE  NEGATIVE mg/dL   Protein, ur NEGATIVE  NEGATIVE mg/dL   Urobilinogen, UA 0.2  0.0 - 1.0 mg/dL   Nitrite NEGATIVE  NEGATIVE   Leukocytes, UA NEGATIVE  NEGATIVE  POCT PREGNANCY, URINE      Component Value Range   Preg Test, Ur NEGATIVE  NEGATIVE         MDM   Previous ed charts reviewed.  Pain today similar to previous.  Patient has ttp of the lumbar spine and paraspinal muscles.  No focal neuro deficits on exam.  Ambulates with a steady gait.   Doubt acute infectious or emergent neurological process  [place] narcotics database reviewed.  The patient appears reasonably screened and/or stabilized for discharge and I doubt any other medical condition or other [facility] requiring further screening, evaluation, or treatment in the ED at this time prior to discharge.    Prescribed: Norco #20 flexeril   Takina Busser L. [place], [place] 10/09/11 1610

## 2011-10-09 NOTE — ED Notes (Signed)
Pt states intermittent lower back pain since having child two years ago. Dx with kyphosis and scheuermann's disease. Referred to specialists but has not made an appt with him. States pain began again last night.

## 2011-10-12 NOTE — ED Provider Notes (Signed)
Medical screening examination/treatment/procedure(s) were performed by non-physician practitioner and as supervising physician I was immediately available for consultation/collaboration.   Shelda Jakes, MD 10/12/11 1009

## 2011-12-09 ENCOUNTER — Encounter (HOSPITAL_COMMUNITY): Payer: Self-pay

## 2011-12-09 ENCOUNTER — Emergency Department (HOSPITAL_COMMUNITY)
Admission: EM | Admit: 2011-12-09 | Discharge: 2011-12-09 | Disposition: A | Discharge status: Home / Self Care | Payer: Medicaid Other | Attending: Emergency Medicine | Admitting: Emergency Medicine

## 2011-12-09 DIAGNOSIS — F17200 Nicotine dependence, unspecified, uncomplicated: Secondary | ICD-10-CM | POA: Insufficient documentation

## 2011-12-09 DIAGNOSIS — Z79899 Other long term (current) drug therapy: Secondary | ICD-10-CM | POA: Insufficient documentation

## 2011-12-09 DIAGNOSIS — K089 Disorder of teeth and supporting structures, unspecified: Secondary | ICD-10-CM | POA: Insufficient documentation

## 2011-12-09 DIAGNOSIS — I10 Essential (primary) hypertension: Secondary | ICD-10-CM | POA: Insufficient documentation

## 2011-12-09 DIAGNOSIS — K029 Dental caries, unspecified: Secondary | ICD-10-CM | POA: Insufficient documentation

## 2011-12-09 DIAGNOSIS — K0889 Other specified disorders of teeth and supporting structures: Secondary | ICD-10-CM

## 2011-12-09 DIAGNOSIS — Z8739 Personal history of other diseases of the musculoskeletal system and connective tissue: Secondary | ICD-10-CM | POA: Insufficient documentation

## 2011-12-09 MED ORDER — HYDROCODONE-ACETAMINOPHEN 5-325 MG PO TABS: ORAL_TABLET | ORAL | Status: DC

## 2011-12-09 MED ORDER — PENICILLIN V POTASSIUM 500 MG PO TABS: 500.0000 mg | ORAL_TABLET | Freq: Four times a day (QID) | ORAL | Status: AC

## 2011-12-09 NOTE — ED Notes (Signed)
Patient with no complaints at this time. Respirations even and unlabored. Skin warm/dry. Discharge instructions reviewed with patient at this time. Patient given opportunity to voice concerns/ask questions. Patient discharged at this time and left Emergency Department with steady gait.   

## 2011-12-09 NOTE — ED Provider Notes (Signed)
History     CSN: [id number]  Arrival date & time 12/09/11  0745   First MD Initiated Contact with Patient 12/09/11 [phone]      Chief Complaint  Patient presents with  . Dental Pain    (Consider location/radiation/quality/duration/timing/severity/associated sxs/prior treatment) Patient is a 31 y.o. female presenting with tooth pain. The history is provided by the patient.  Dental PainThe primary symptoms include mouth pain. Primary symptoms do not include dental injury, oral bleeding, headaches, fever, shortness of breath, sore throat, angioedema or cough. The symptoms are unchanged. The symptoms are recurrent. The symptoms occur constantly.  Mouth pain occurs constantly. Mouth pain is worsening. Affected locations include: teeth and gum(s).  Additional symptoms include: dental sensitivity to temperature and gum tenderness. Additional symptoms do not include: gum swelling, trismus, jaw pain, facial swelling, trouble swallowing, ear pain, hearing loss and swollen glands. Medical issues include: smoking and periodontal disease.    Past Medical History  Diagnosis Date  . Hypertension   . Kyphosis   . Scheuermann's disease     Past Surgical History  Procedure Date  . Cesarean section     No family history on file.  History  Substance Use Topics  . Smoking status: Current Every Day Smoker -- 1.0 packs/day    Types: Cigarettes  . Smokeless tobacco: Not on file  . Alcohol Use: No    OB History    Grav Para Term Preterm Abortions TAB SAB Ect Mult Living                  Review of Systems  Constitutional: Negative for fever and appetite change.  HENT: Positive for dental problem. Negative for hearing loss, ear pain, congestion, sore throat, facial swelling, trouble swallowing, neck pain and neck stiffness.   Eyes: Negative for pain and visual disturbance.  Respiratory: Negative for cough and shortness of breath.   Gastrointestinal: Negative for nausea and vomiting.  Skin:  Negative.   Neurological: Negative for dizziness, facial asymmetry and headaches.  Hematological: Negative for adenopathy.  All other systems reviewed and are negative.    Allergies  Codeine  Home Medications   Current Outpatient Rx  Name Route Sig Dispense Refill  . IBUPROFEN 200 MG PO TABS Oral Take 400 mg by mouth daily as needed. For headaches     . LEVONORGESTREL 20 MCG/24HR IU IUD Intrauterine 1 each by Intrauterine route once.      BP 118/73  Pulse 100  Temp 98.1 F (36.7 C) (Oral)  Resp 18  Ht 5\' 1"  (1.549 m)  Wt 145 lb (65.772 kg)  BMI 27.40 kg/m2  SpO2 100%  Physical Exam  Nursing note and vitals reviewed. Constitutional: She is oriented to person, place, and time. She appears well-developed and well-nourished. No distress.  HENT:  Head: Normocephalic and atraumatic. No trismus in the jaw.  Right Ear: Tympanic membrane and ear canal normal.  Left Ear: Tympanic membrane and ear canal normal.  Mouth/Throat: Uvula is midline, oropharynx is clear and moist and mucous membranes are normal. Dental caries present. No dental abscesses or uvula swelling.    Neck: Normal range of motion. Neck supple.  Cardiovascular: Normal rate, regular rhythm and normal heart sounds.   No murmur heard. Pulmonary/Chest: Effort normal and breath sounds normal.  Musculoskeletal: Normal range of motion.  Lymphadenopathy:    She has no cervical adenopathy.  Neurological: She is alert and oriented to person, place, and time. She exhibits normal muscle tone. Coordination normal.  Skin:  Skin is warm and dry.    ED Course  Procedures (including critical care time)  Labs Reviewed - No data to display      MDM     Previous ED charts reviewed by me.  Pt has hx of recurrent dental pain.  On today's visit, no facial edema, trismus, or obvious abscess  Prescribed: Pen VK norco #20     Mikiya Nebergall L. Aeson Sawyers, PA 12/09/11 0828  Freeland Pracht L. Athan Casalino, PA 12/09/11 0830

## 2011-12-09 NOTE — ED Notes (Signed)
Complain of dental pain. States she ate candy yesterday and thinks she bit a nut

## 2011-12-09 NOTE — ED Notes (Signed)
PA at bedside.

## 2011-12-10 ENCOUNTER — Emergency Department (HOSPITAL_COMMUNITY)
Admission: EM | Admit: 2011-12-10 | Discharge: 2011-12-10 | Disposition: A | Discharge status: Home / Self Care | Payer: Medicaid Other | Attending: Emergency Medicine | Admitting: Emergency Medicine

## 2011-12-10 ENCOUNTER — Encounter (HOSPITAL_COMMUNITY): Payer: Self-pay | Admitting: *Deleted

## 2011-12-10 DIAGNOSIS — M4000 Postural kyphosis, site unspecified: Secondary | ICD-10-CM | POA: Insufficient documentation

## 2011-12-10 DIAGNOSIS — K029 Dental caries, unspecified: Secondary | ICD-10-CM | POA: Insufficient documentation

## 2011-12-10 DIAGNOSIS — M4200 Juvenile osteochondrosis of spine, site unspecified: Secondary | ICD-10-CM | POA: Insufficient documentation

## 2011-12-10 DIAGNOSIS — I10 Essential (primary) hypertension: Secondary | ICD-10-CM | POA: Insufficient documentation

## 2011-12-10 DIAGNOSIS — F17200 Nicotine dependence, unspecified, uncomplicated: Secondary | ICD-10-CM | POA: Insufficient documentation

## 2011-12-10 DIAGNOSIS — Z79899 Other long term (current) drug therapy: Secondary | ICD-10-CM | POA: Insufficient documentation

## 2011-12-10 DIAGNOSIS — K089 Disorder of teeth and supporting structures, unspecified: Secondary | ICD-10-CM | POA: Insufficient documentation

## 2011-12-10 DIAGNOSIS — J029 Acute pharyngitis, unspecified: Secondary | ICD-10-CM

## 2011-12-10 DIAGNOSIS — Z792 Long term (current) use of antibiotics: Secondary | ICD-10-CM | POA: Insufficient documentation

## 2011-12-10 MED ORDER — OXYCODONE-ACETAMINOPHEN 5-325 MG PO TABS
1.0000 | ORAL_TABLET | Freq: Once | ORAL | Status: AC
  Administered 2011-12-10: 1 via ORAL
  Filled 2011-12-10: qty 1

## 2011-12-10 NOTE — ED Notes (Signed)
Pt seen yesterday for dental pain. States pain to throat today. Pt states her child has also been sick with croup. Pain is worse with swallowing with pain to right ear also

## 2011-12-11 NOTE — ED Provider Notes (Signed)
History     CSN: [id number]  Arrival date & time 12/10/11  1213   First MD Initiated Contact with Patient 12/10/11 1232      Chief Complaint  Patient presents with  . Dental Pain  . Sore Throat    (Consider location/radiation/quality/duration/timing/severity/associated sxs/prior treatment) HPI Comments: Patient who was seen here for dental pain on the day prior to this visit, c/o continued dental pain and now also c/o sore throat that began today.  Pain is worse with swallowing or chewing.  States she is taking the antibiotic prescribed.  She denies fever, cough, headache or neck pain.    Patient is a 31 y.o. female presenting with tooth pain. The history is provided by the patient.  Dental PainThe primary symptoms include mouth pain and sore throat. Primary symptoms do not include dental injury, oral bleeding, headaches, fever, shortness of breath, angioedema or cough. The symptoms began 12 to 24 hours ago. The symptoms are unchanged. The symptoms are new. The symptoms occur constantly.  Affected locations include: teeth and gum(s).  The sore throat began today. The sore throat is moderate in intensity. The sore throat is not accompanied by trouble swallowing, drooling, hoarse voice or stridor.   Additional symptoms include: dental sensitivity to temperature, gum swelling, gum tenderness and pain with swallowing. Additional symptoms do not include: purulent gums, trismus, facial swelling, trouble swallowing, drooling and ear pain. Medical issues include: smoking and periodontal disease.    Past Medical History  Diagnosis Date  . Hypertension   . Kyphosis   . Scheuermann's disease     Past Surgical History  Procedure Date  . Cesarean section     No family history on file.  History  Substance Use Topics  . Smoking status: Current Every Day Smoker -- 1.0 packs/day    Types: Cigarettes  . Smokeless tobacco: Not on file  . Alcohol Use: No    OB History    Grav Para Term  Preterm Abortions TAB SAB Ect Mult Living                  Review of Systems  Constitutional: Negative for fever, chills, activity change and appetite change.  HENT: Positive for sore throat and dental problem. Negative for ear pain, hoarse voice, facial swelling, drooling, trouble swallowing, neck pain, neck stiffness and voice change.   Eyes: Negative for pain and visual disturbance.  Respiratory: Negative for cough, shortness of breath and stridor.   Gastrointestinal: Negative for nausea, vomiting and abdominal pain.  Musculoskeletal: Negative for arthralgias.  Skin: Negative for color change and rash.  Neurological: Negative for dizziness, facial asymmetry, speech difficulty, numbness and headaches.  Hematological: Negative for adenopathy.  All other systems reviewed and are negative.    Allergies  Codeine  Home Medications   Current Outpatient Rx  Name Route Sig Dispense Refill  . HYDROCODONE-ACETAMINOPHEN 5-325 MG PO TABS Oral Take 1-2 tablets by mouth every 4 (four) hours as needed. Take one-two tabs po q 4-6 hrs prn pain    . IBUPROFEN 200 MG PO TABS Oral Take 400 mg by mouth daily as needed. For headaches     . LEVONORGESTREL 20 MCG/24HR IU IUD Intrauterine 1 each by Intrauterine route once.    Marland Kitchen LISINOPRIL 20 MG PO TABS Oral Take 20 mg by mouth daily.    Marland Kitchen PENICILLIN V POTASSIUM 500 MG PO TABS Oral Take 1 tablet (500 mg total) by mouth 4 (four) times daily. 40 tablet 0  BP 122/83  Pulse 110  Temp 98.8 F (37.1 C) (Oral)  Resp 16  Ht 5\' 2"  (1.575 m)  Wt 140 lb (63.504 kg)  BMI 25.61 kg/m2  SpO2 100%  Physical Exam  Nursing note and vitals reviewed. Constitutional: She is oriented to person, place, and time. She appears well-developed and well-nourished. No distress.  HENT:  Head: Normocephalic and atraumatic. No trismus in the jaw.  Right Ear: Tympanic membrane and ear canal normal.  Left Ear: Tympanic membrane and ear canal normal.  Mouth/Throat: Uvula is  midline and mucous membranes are normal. Dental caries present. No uvula swelling. Posterior oropharyngeal erythema present. No oropharyngeal exudate, posterior oropharyngeal edema or tonsillar abscesses.  Neck: Normal range of motion, full passive range of motion without pain and phonation normal. Neck supple. No Brudzinski's sign and no Kernig's sign noted.  Cardiovascular: Normal rate, regular rhythm and normal heart sounds.   Pulmonary/Chest: Effort normal and breath sounds normal.  Musculoskeletal: Normal range of motion.  Lymphadenopathy:    She has no cervical adenopathy.  Neurological: She is alert and oriented to person, place, and time. She exhibits normal muscle tone. Coordination normal.  Skin: Skin is warm and dry.    ED Course  Procedures (including critical care time)      1. Sore throat       MDM    Patient is alert, non-toxic appearing, no meningeal signs.  Mild erythema to the oropharynx w/o exudate, edema, or PTA.  Dental caries w/o dental abscess.  Pt is currently taking Pen VK  Advised to continue abx, encourage fluids.  Ibuprofen.  F/u with PMD if needed and also f/u with a dentist.      Stephania Macfarlane L. [place], [place] 12/11/11 2141

## 2011-12-11 NOTE — ED Provider Notes (Signed)
Medical screening examination/treatment/procedure(s) were performed by non-physician practitioner and as supervising physician I was immediately available for consultation/collaboration.  Tharun Cappella, MD 12/11/11 0006 

## 2011-12-13 ENCOUNTER — Emergency Department (HOSPITAL_COMMUNITY): Payer: Medicaid Other

## 2011-12-13 ENCOUNTER — Encounter (HOSPITAL_COMMUNITY): Payer: Self-pay | Admitting: *Deleted

## 2011-12-13 ENCOUNTER — Emergency Department (HOSPITAL_COMMUNITY)
Admission: EM | Admit: 2011-12-13 | Discharge: 2011-12-13 | Payer: Medicaid Other | Attending: Emergency Medicine | Admitting: Emergency Medicine

## 2011-12-13 DIAGNOSIS — R259 Unspecified abnormal involuntary movements: Secondary | ICD-10-CM | POA: Insufficient documentation

## 2011-12-13 DIAGNOSIS — K122 Cellulitis and abscess of mouth: Secondary | ICD-10-CM

## 2011-12-13 DIAGNOSIS — I10 Essential (primary) hypertension: Secondary | ICD-10-CM | POA: Insufficient documentation

## 2011-12-13 DIAGNOSIS — K047 Periapical abscess without sinus: Secondary | ICD-10-CM

## 2011-12-13 DIAGNOSIS — F17200 Nicotine dependence, unspecified, uncomplicated: Secondary | ICD-10-CM | POA: Insufficient documentation

## 2011-12-13 DIAGNOSIS — Z791 Long term (current) use of non-steroidal anti-inflammatories (NSAID): Secondary | ICD-10-CM | POA: Insufficient documentation

## 2011-12-13 LAB — BASIC METABOLIC PANEL
CO2: 26 mEq/L (ref 19–32)
Calcium: 9.8 mg/dL (ref 8.4–10.5)
Chloride: 98 mEq/L (ref 96–112)
Creatinine, Ser: 0.45 mg/dL — ABNORMAL LOW (ref 0.50–1.10)
GFR calc Af Amer: >90 mL/min (ref >90)
Sodium: 136 mEq/L (ref 135–145)

## 2011-12-13 LAB — CBC WITH DIFFERENTIAL/PLATELET
Basophils Absolute: 0 10*3/uL (ref 0.0–0.1)
Basophils Relative: 0 % (ref 0–1)
Lymphocytes Relative: 16 % (ref 12–46)
MCHC: 35.3 g/dL (ref 30.0–36.0)
Neutro Abs: 10.1 10*3/uL — ABNORMAL HIGH (ref 1.7–7.7)
Platelets: 260 10*3/uL (ref 150–400)
RDW: 12.6 % (ref 11.5–15.5)
WBC: 13.2 10*3/uL — ABNORMAL HIGH (ref 4.0–10.5)

## 2011-12-13 LAB — URINALYSIS, ROUTINE W REFLEX MICROSCOPIC
Glucose, UA: NEGATIVE mg/dL
Ketones, ur: >80 mg/dL — AB
Leukocytes, UA: NEGATIVE
Nitrite: NEGATIVE
Protein, ur: NEGATIVE mg/dL
pH: 6.5 (ref 5.0–8.0)

## 2011-12-13 LAB — POCT PREGNANCY, URINE: Preg Test, Ur: NEGATIVE

## 2011-12-13 MED ORDER — METHYLPREDNISOLONE SODIUM SUCC 125 MG IJ SOLR
125.0000 mg | Freq: Once | INTRAMUSCULAR | Status: AC
  Administered 2011-12-13: 125 mg via INTRAVENOUS
  Filled 2011-12-13: qty 2

## 2011-12-13 MED ORDER — CLINDAMYCIN PHOSPHATE 900 MG/50ML IV SOLN
900.0000 mg | Freq: Once | INTRAVENOUS | Status: AC
  Administered 2011-12-13: 900 mg via INTRAVENOUS
  Filled 2011-12-13: qty 50

## 2011-12-13 MED ORDER — SODIUM CHLORIDE 0.9 % IV BOLUS (SEPSIS)
500.0000 mL | Freq: Once | INTRAVENOUS | Status: AC
  Administered 2011-12-13: 1000 mL via INTRAVENOUS

## 2011-12-13 MED ORDER — IOHEXOL 300 MG/ML  SOLN
75.0000 mL | Freq: Once | INTRAMUSCULAR | Status: AC | PRN
  Administered 2011-12-13: 75 mL via INTRAVENOUS

## 2011-12-13 MED ORDER — SODIUM CHLORIDE 0.9 % IV SOLN
Freq: Once | INTRAVENOUS | Status: AC
  Administered 2011-12-13: 09:00:00 via INTRAVENOUS

## 2011-12-13 MED ORDER — MORPHINE SULFATE 4 MG/ML IJ SOLN
4.0000 mg | Freq: Once | INTRAMUSCULAR | Status: AC
  Administered 2011-12-13: 4 mg via INTRAVENOUS
  Filled 2011-12-13: qty 1

## 2011-12-13 MED ORDER — ONDANSETRON HCL 4 MG/2ML IJ SOLN
4.0000 mg | Freq: Once | INTRAMUSCULAR | Status: AC
  Administered 2011-12-13: 4 mg via INTRAVENOUS
  Filled 2011-12-13: qty 2

## 2011-12-13 MED ORDER — DEXTROSE 5 % IV SOLN: 900.0000 mg | Freq: Once | INTRAVENOUS | Status: DC

## 2011-12-13 NOTE — ED Notes (Signed)
Patient demographics faxed to [phone]

## 2011-12-13 NOTE — ED Notes (Signed)
Pt c/o continued dental pain to right side of jaw area, pain now spreads to jaw, ear and throat area, pt states that she is not able to open her mouth but just "a little bit" due to pain, pt also states that she has swelling to side of right jaw and right throat area. Advises that she is still taking the antibiotics that she was given, is not able to obtain appointment with her dentist yet.

## 2011-12-13 NOTE — ED Provider Notes (Signed)
History     CSN: [id number]  Arrival date & time 12/13/11  4098   First MD Initiated Contact with Patient 12/13/11 [phone]      Chief Complaint  Patient presents with  . Dental Pain    (Consider location/radiation/quality/duration/timing/severity/associated sxs/prior treatment) HPI Comments: Patient returns to ED for a third visit c/o dental pain.  She states she now has difficulty opening her mouth and pain with swallowing.  Describes an aching pain from the right ear into the jaw and down her right neck.  Patient was started on Pen VK on her 12/09/11 visit and she states she has been taking them regularly.  She denies fever, headaches, numbness or neck stiffness.  She states she has not been able to get an appt with a dentist yet.   Patient is a 31 y.o. female presenting with tooth pain. The history is provided by the patient.  Dental PainThe primary symptoms include mouth pain. Primary symptoms do not include dental injury, oral bleeding, headaches, fever, shortness of breath, sore throat, angioedema or cough. The symptoms began 3 to 5 days ago. The symptoms are worsening. The symptoms are new. The symptoms occur constantly.  Affected locations include: teeth and gum(s) (right jaw).  Additional symptoms include: dental sensitivity to temperature, gum swelling, gum tenderness, trismus, jaw pain, trouble swallowing, pain with swallowing and ear pain. Additional symptoms do not include: facial swelling, drooling, hearing loss, nosebleeds, swollen glands, goiter and fatigue. Medical issues include: smoking and periodontal disease.    Past Medical History  Diagnosis Date  . Hypertension   . Kyphosis   . Scheuermann's disease     Past Surgical History  Procedure Date  . Cesarean section     No family history on file.  History  Substance Use Topics  . Smoking status: Current Every Day Smoker -- 1.0 packs/day    Types: Cigarettes  . Smokeless tobacco: Not on file  . Alcohol Use: No      OB History    Grav Para Term Preterm Abortions TAB SAB Ect Mult Living                  Review of Systems  Constitutional: Negative for fever, activity change, appetite change and fatigue.  HENT: Positive for ear pain, trouble swallowing, neck pain and dental problem. Negative for hearing loss, nosebleeds, congestion, sore throat, facial swelling, drooling and neck stiffness.   Eyes: Negative for pain and visual disturbance.  Respiratory: Negative for cough and shortness of breath.   Gastrointestinal: Negative for vomiting and abdominal pain.  Genitourinary: Negative for dysuria.  Musculoskeletal: Negative for arthralgias.  Skin: Negative for color change and rash.  Neurological: Negative for dizziness, facial asymmetry, weakness, numbness and headaches.  Hematological: Negative for adenopathy.  All other systems reviewed and are negative.    Allergies  Codeine  Home Medications   Current Outpatient Rx  Name  Route  Sig  Dispense  Refill  . HYDROCODONE-ACETAMINOPHEN 5-325 MG PO TABS   Oral   Take 1-2 tablets by mouth every 4 (four) hours as needed. Take one-two tabs po q 4-6 hrs prn pain         . IBUPROFEN 200 MG PO TABS   Oral   Take 400 mg by mouth daily as needed. For headaches          . LEVONORGESTREL 20 MCG/24HR IU IUD   Intrauterine   1 each by Intrauterine route once.         Marland Kitchen  LISINOPRIL 20 MG PO TABS   Oral   Take 20 mg by mouth daily.         Marland Kitchen PENICILLIN V POTASSIUM 500 MG PO TABS   Oral   Take 1 tablet (500 mg total) by mouth 4 (four) times daily.   40 tablet   0     BP 132/89  Pulse 138  Temp 98.6 F (37 C)  Resp 20  SpO2 100%  Physical Exam  Nursing note and vitals reviewed. Constitutional: She is oriented to person, place, and time. She appears well-developed and well-nourished.       Patient appears uncomfortable and is tearful  HENT:  Head: Normocephalic and atraumatic. There is trismus in the jaw.  Right Ear: Tympanic  membrane and ear canal normal. No mastoid tenderness. No hemotympanum.  Left Ear: Tympanic membrane and ear canal normal. No mastoid tenderness. No hemotympanum.  Mouth/Throat: Uvula is midline and mucous membranes are normal. Dental caries present. No uvula swelling. No posterior oropharyngeal edema, posterior oropharyngeal erythema or tonsillar abscesses.       Patient has multiple dental caries with significant dental decay and ttp of the right lower molars. Right sided trismus is present. airway is patent.  Slight STS of the right submandibular region  Eyes: EOM are normal. Pupils are equal, round, and reactive to light.  Neck: Normal range of motion. No thyromegaly present.       Tenderness to palpation of the right submandibular area with mild soft tissue swelling present  Cardiovascular: Regular rhythm, normal heart sounds and intact distal pulses.   No murmur heard.      tachycardia  Pulmonary/Chest: Effort normal and breath sounds normal. No stridor. No respiratory distress.  Musculoskeletal: Normal range of motion.  Neurological: She is alert and oriented to person, place, and time. She exhibits normal muscle tone. Coordination normal.  Skin: Skin is warm and dry. No erythema.    ED Course  Procedures (including critical care time)  Labs Reviewed  CBC WITH DIFFERENTIAL - Abnormal; Notable for the following:    WBC 13.2 (*)     Neutro Abs 10.1 (*)     All other components within normal limits  BASIC METABOLIC PANEL - Abnormal; Notable for the following:    Creatinine, Ser 0.45 (*)     All other components within normal limits  URINALYSIS, ROUTINE W REFLEX MICROSCOPIC - Abnormal; Notable for the following:    Color, Urine STRAW (*)     Bilirubin Urine SMALL (*)     Ketones, ur >80 (*)     Urobilinogen, UA 2.0 (*)     All other components within normal limits  POCT PREGNANCY, URINE    Ct Soft Tissue Neck W Contrast  12/13/2011  *RADIOLOGY REPORT*  Clinical Data:  Right-sided facial swelling. Difficulty swallowing. Previous visits to the Emergency Department for the same problem on 12/09/2011 and 12/10/2011, with progression of symptoms on antibiotics.  CT NECK WITH CONTRAST  Technique:  Multidetector CT imaging of the neck was performed with intravenous contrast.  Contrast: 75mL OMNIPAQUE IOHEXOL 300 MG/ML  SOLN  Comparison: None.  Findings:   There is a fluid collection in the right sublingual space measuring 7 x 17 x 10 mm adjacent to an area of lucency in the right mandible representing a periapical abscess associated with tooth 32 (right lower mandible wisdom tooth).  There is surrounding inflammation in the right parapharyngeal space.  There is no frank abscess in the right submandibular space  although the muscles of the right side of the tongue appear inflamed.  There is slight right-to-left shift of the median raphe of the tongue, and mild  mass effect on the oropharynx.  There is effacement of the vallecula on the right.  There is no retropharyngeal soft tissue swelling.  There is regional adenopathy at the level II lymph nodes on the right.  The right submandibular gland is slightly swollen. There are no submandibular calculi.  There is a large dental caries in tooth 18.  Multiple maxillary teeth are missing.  The larynx is unremarkable.  Carotid and jugular vessels appear widely patent.  Mild cervical spondylosis noted.  Visualized intracranial compartment unremarkable.  Air-fluid level left division sphenoid sinus.  Bilateral ethmoid air cell fluid, likely chronic.  Moderately advanced mucosal thickening both maxillary sinuses without definite air-fluid levels.  IMPRESSION: Right medial mandibular fluid collection in the sublingual space approximately 7 x 17 x 10 mm adjacent to an area of periodontal disease affecting the right mandibular wisdom tooth. Moderate right parapharyngeal mass effect but no spread of abscess to the submandibular space or retropharyngeal  compartment.  Findings discussed with the ordering provider.  Clinical correlation recommended with regard to difficulty in breathing and/or managing excretions.  ENT consultation may be warranted.   Original Report Authenticated By: Davonna Belling, M.D.       MDM    Patient has received IVF's, morphine, zofran, and clindamycin.  Patient is feeling better after medications, tachycardia improved, pt also seen by EDP and care plan discussed.    Results were discussed with the patient.  Will consult oral surgery or ENT.   Dr. Ignacia Palma has consulted Dr. Delia Heady, ENT at [facility], who has accepted the patient for transfer.       Estela Vinal L. Mukhtar Shams, [place] 12/13/11 1316

## 2011-12-13 NOTE — ED Notes (Signed)
Pt c/o jaw pain, swelling to jaw and throat area noted, pt states that she has been seen in er last week due to toothache and is not getting any better.

## 2011-12-13 NOTE — Progress Notes (Signed)
9:26 AM Pt has been treated for dental infection with penicillin for several days.  She now presents with trismus and pain in the right side of her neck and difficulty swallowing.  Exam shows her awake and alert, nontoxic appearance.  She has difficulty opening her mouth.  Limited oral exam due to trismus shows severely decayed right lower third molar. She has right submandibular lymph node swelling and tenderness. Airway is patent.  CBC shows elevated WBC of 13,200 with normal diff.  CT of neck pending.  CT of neck shows swelling of the oral soft tissues, slight shift of airway to left without obstruction.  Call to Dr. Delia Heady, ENT surgeon, at [facility], who accepts pt in transfer.

## 2011-12-13 NOTE — ED Notes (Signed)
[facility] called for patient transfer 

## 2011-12-13 NOTE — ED Notes (Signed)
Called ct reference ct results, advised by ct tech Peyton Najjar that he was consulting with radiologist on delay

## 2011-12-13 NOTE — ED Notes (Signed)
Urine pregnancy test is negative, Ct notified,

## 2011-12-14 NOTE — ED Provider Notes (Signed)
Medical screening examination/treatment/procedure(s) were conducted as a shared visit with non-physician practitioner(s) and myself.  I personally evaluated the patient during the encounter Pt has been treated for dental infection with penicillin for several days. She now presents with trismus and pain in the right side of her neck and difficulty swallowing. Exam shows her awake and alert, nontoxic appearance. She has difficulty opening her mouth. Limited oral exam due to trismus shows severely decayed right lower third molar. She has right submandibular lymph node swelling and tenderness. Airway is patent. CBC shows elevated WBC of 13,200 with normal diff. CT of neck pending.  CT of neck shows swelling of the oral soft tissues, slight shift of airway to left without obstruction. Call to Dr. Delia Heady, ENT surgeon, at [facility], who accepts pt in transfer.   Carleene Cooper III, MD 12/14/11 [phone]

## 2011-12-15 NOTE — ED Provider Notes (Signed)
Medical screening examination/treatment/procedure(s) were performed by non-physician practitioner and as supervising physician I was immediately available for consultation/collaboration.   Shelda Jakes, MD 12/15/11 [phone]

## 2012-02-06 ENCOUNTER — Emergency Department (HOSPITAL_COMMUNITY)
Admission: EM | Admit: 2012-02-06 | Discharge: 2012-02-06 | Disposition: A | Discharge status: Home / Self Care | Payer: Medicaid Other | Attending: Emergency Medicine | Admitting: Emergency Medicine

## 2012-02-06 ENCOUNTER — Encounter (HOSPITAL_COMMUNITY): Payer: Self-pay | Admitting: Emergency Medicine

## 2012-02-06 DIAGNOSIS — F17200 Nicotine dependence, unspecified, uncomplicated: Secondary | ICD-10-CM | POA: Insufficient documentation

## 2012-02-06 DIAGNOSIS — S335XXA Sprain of ligaments of lumbar spine, initial encounter: Secondary | ICD-10-CM | POA: Insufficient documentation

## 2012-02-06 DIAGNOSIS — Y929 Unspecified place or not applicable: Secondary | ICD-10-CM | POA: Insufficient documentation

## 2012-02-06 DIAGNOSIS — X58XXXA Exposure to other specified factors, initial encounter: Secondary | ICD-10-CM | POA: Insufficient documentation

## 2012-02-06 DIAGNOSIS — S39012A Strain of muscle, fascia and tendon of lower back, initial encounter: Secondary | ICD-10-CM

## 2012-02-06 DIAGNOSIS — Y939 Activity, unspecified: Secondary | ICD-10-CM | POA: Insufficient documentation

## 2012-02-06 DIAGNOSIS — Z79899 Other long term (current) drug therapy: Secondary | ICD-10-CM | POA: Insufficient documentation

## 2012-02-06 DIAGNOSIS — M4000 Postural kyphosis, site unspecified: Secondary | ICD-10-CM | POA: Insufficient documentation

## 2012-02-06 DIAGNOSIS — I10 Essential (primary) hypertension: Secondary | ICD-10-CM | POA: Insufficient documentation

## 2012-02-06 MED ORDER — CYCLOBENZAPRINE HCL 5 MG PO TABS: 5.0000 mg | ORAL_TABLET | Freq: Three times a day (TID) | ORAL | Status: DC | PRN

## 2012-02-06 MED ORDER — HYDROCODONE-ACETAMINOPHEN 5-325 MG PO TABS: 1.0000 | ORAL_TABLET | ORAL | Status: AC | PRN

## 2012-02-06 NOTE — ED Notes (Signed)
Pt c/o chronic back pain, but has gotten worse since last night. Pt denies any new injury.

## 2012-02-06 NOTE — ED Provider Notes (Signed)
History     CSN: [id number]  Arrival date & time 02/06/12  4098   First MD Initiated Contact with Patient 02/06/12 [phone]      Chief Complaint  Patient presents with  . Back Pain    (Consider location/radiation/quality/duration/timing/severity/associated sxs/prior treatment) HPI Comments: Angelica Lopez  presents with acute on chronic low back pain which has which has worsened since last night.   Patient denies any new injury specifically, but states she has kyphosis which was diagnosed with an MRI within the past 6 months.  She has increased pain whenever she tries to play with her children which occurred last night.  She was referred to a spine specialist in [place] was was unable to obtain transportation for this appointment.  There is no radiation into the  lower extremities.  There has been no weakness or numbness in the lower extremities and no urinary or bowel retention or incontinence.  Patient does not have a history of cancer or IVDU.   The history is provided by the patient.    Past Medical History  Diagnosis Date  . Hypertension   . Kyphosis   . Scheuermann's disease     Past Surgical History  Procedure Date  . Cesarean section     History reviewed. No pertinent family history.  History  Substance Use Topics  . Smoking status: Current Every Day Smoker -- 1.0 packs/day    Types: Cigarettes  . Smokeless tobacco: Not on file  . Alcohol Use: No    OB History    Grav Para Term Preterm Abortions TAB SAB Ect Mult Living                  Review of Systems  Constitutional: Negative for fever.  Respiratory: Negative for shortness of breath.   Cardiovascular: Negative for chest pain and leg swelling.  Gastrointestinal: Negative for abdominal pain, constipation and abdominal distention.  Genitourinary: Negative for dysuria, urgency, frequency, flank pain and difficulty urinating.  Musculoskeletal: Positive for back pain. Negative for joint swelling and gait  problem.  Skin: Negative for rash.  Neurological: Negative for weakness and numbness.    Allergies  Codeine  Home Medications   Current Outpatient Rx  Name  Route  Sig  Dispense  Refill  . CYCLOBENZAPRINE HCL 5 MG PO TABS   Oral   Take 1 tablet (5 mg total) by mouth 3 (three) times daily as needed for muscle spasms.   15 tablet   0   . HYDROCODONE-ACETAMINOPHEN 5-325 MG PO TABS   Oral   Take 1-2 tablets by mouth every 4 (four) hours as needed. Take one-two tabs po q 4-6 hrs prn pain         . HYDROCODONE-ACETAMINOPHEN 5-325 MG PO TABS   Oral   Take 1 tablet by mouth every 4 (four) hours as needed for pain.   15 tablet   0   . LEVONORGESTREL 20 MCG/24HR IU IUD   Intrauterine   1 each by Intrauterine route once.         Marland Kitchen LISINOPRIL 20 MG PO TABS   Oral   Take 20 mg by mouth daily.           BP 125/82  Pulse 125  Temp 97.8 F (36.6 C) (Oral)  Resp 18  Ht 5\' 2"  (1.575 m)  Wt 145 lb (65.772 kg)  BMI 26.52 kg/m2  SpO2 99%  Physical Exam  Nursing note and vitals reviewed. Constitutional: She appears well-developed  and well-nourished.  HENT:  Head: Normocephalic.  Eyes: Conjunctivae normal are normal.  Neck: Normal range of motion. Neck supple.  Cardiovascular: Normal rate and intact distal pulses.        Pedal pulses normal.  Pulmonary/Chest: Effort normal.  Abdominal: Soft. Bowel sounds are normal. She exhibits no distension and no mass.  Musculoskeletal: Normal range of motion. She exhibits tenderness. She exhibits no edema.       Lumbar back: She exhibits tenderness. She exhibits no swelling, no edema, no deformity and no spasm.       Back:  Neurological: She is alert. She has normal strength. She displays no atrophy and no tremor. No sensory deficit. Gait normal.  Reflex Scores:      Patellar reflexes are 2+ on the right side and 2+ on the left side.      Achilles reflexes are 2+ on the right side and 2+ on the left side.      No strength deficit  noted in hip and knee flexor and extensor muscle groups.  Ankle flexion and extension intact.  Skin: Skin is warm and dry.  Psychiatric: She has a normal mood and affect.    ED Course  Procedures (including critical care time)  Labs Reviewed - No data to display No results found.   1. Lumbar strain       MDM  Patient is anticipating establishing care with Dr. Delbert Harness and she was encouraged to do this as soon as possible for recheck of her symptoms.  She was prescribed Flexeril and hydrocodone for pain relief.  No neuro deficit on exam or by history to suggest emergent or surgical presentation.  Also discussed worsened sx that should prompt immediate re-evaluation including distal weakness, bowel/bladder retention/incontinence.              Burgess Amor, PA 02/06/12 1025

## 2012-02-09 NOTE — ED Provider Notes (Signed)
Medical screening examination/treatment/procedure(s) were performed by non-physician practitioner and as supervising physician I was immediately available for consultation/collaboration.  Levonte Molina, MD 02/09/12 1401 

## 2012-05-16 ENCOUNTER — Emergency Department (HOSPITAL_COMMUNITY)
Admission: EM | Admit: 2012-05-16 | Discharge: 2012-05-16 | Disposition: A | Discharge status: Home / Self Care | Payer: Medicaid Other | Attending: Emergency Medicine | Admitting: Emergency Medicine

## 2012-05-16 ENCOUNTER — Encounter (HOSPITAL_COMMUNITY): Payer: Self-pay

## 2012-05-16 DIAGNOSIS — Z79899 Other long term (current) drug therapy: Secondary | ICD-10-CM | POA: Insufficient documentation

## 2012-05-16 DIAGNOSIS — F17200 Nicotine dependence, unspecified, uncomplicated: Secondary | ICD-10-CM | POA: Insufficient documentation

## 2012-05-16 DIAGNOSIS — R112 Nausea with vomiting, unspecified: Secondary | ICD-10-CM | POA: Insufficient documentation

## 2012-05-16 DIAGNOSIS — N730 Acute parametritis and pelvic cellulitis: Secondary | ICD-10-CM

## 2012-05-16 DIAGNOSIS — Z3202 Encounter for pregnancy test, result negative: Secondary | ICD-10-CM | POA: Insufficient documentation

## 2012-05-16 DIAGNOSIS — N739 Female pelvic inflammatory disease, unspecified: Secondary | ICD-10-CM | POA: Insufficient documentation

## 2012-05-16 DIAGNOSIS — N949 Unspecified condition associated with female genital organs and menstrual cycle: Secondary | ICD-10-CM | POA: Insufficient documentation

## 2012-05-16 DIAGNOSIS — N898 Other specified noninflammatory disorders of vagina: Secondary | ICD-10-CM | POA: Insufficient documentation

## 2012-05-16 DIAGNOSIS — R42 Dizziness and giddiness: Secondary | ICD-10-CM | POA: Insufficient documentation

## 2012-05-16 DIAGNOSIS — R509 Fever, unspecified: Secondary | ICD-10-CM | POA: Insufficient documentation

## 2012-05-16 DIAGNOSIS — N939 Abnormal uterine and vaginal bleeding, unspecified: Secondary | ICD-10-CM

## 2012-05-16 DIAGNOSIS — K5900 Constipation, unspecified: Secondary | ICD-10-CM | POA: Insufficient documentation

## 2012-05-16 DIAGNOSIS — Z8739 Personal history of other diseases of the musculoskeletal system and connective tissue: Secondary | ICD-10-CM | POA: Insufficient documentation

## 2012-05-16 DIAGNOSIS — N938 Other specified abnormal uterine and vaginal bleeding: Secondary | ICD-10-CM | POA: Insufficient documentation

## 2012-05-16 DIAGNOSIS — I10 Essential (primary) hypertension: Secondary | ICD-10-CM | POA: Insufficient documentation

## 2012-05-16 LAB — COMPREHENSIVE METABOLIC PANEL
ALT: 9 U/L (ref 0–35)
Alkaline Phosphatase: 70 U/L (ref 39–117)
CO2: 28 mEq/L (ref 19–32)
Chloride: 94 mEq/L — ABNORMAL LOW (ref 96–112)
GFR calc Af Amer: >90 mL/min (ref >90)
Glucose, Bld: 102 mg/dL — ABNORMAL HIGH (ref 70–99)
Potassium: 3.4 mEq/L — ABNORMAL LOW (ref 3.5–5.1)
Sodium: 132 mEq/L — ABNORMAL LOW (ref 135–145)
Total Bilirubin: 0.8 mg/dL (ref 0.3–1.2)
Total Protein: 7.9 g/dL (ref 6.0–8.3)

## 2012-05-16 LAB — WET PREP, GENITAL: Trich, Wet Prep: NONE SEEN

## 2012-05-16 LAB — URINALYSIS, ROUTINE W REFLEX MICROSCOPIC
Bilirubin Urine: NEGATIVE
Ketones, ur: NEGATIVE mg/dL
Nitrite: NEGATIVE
Protein, ur: NEGATIVE mg/dL
pH: 6.5 (ref 5.0–8.0)

## 2012-05-16 LAB — CBC WITH DIFFERENTIAL/PLATELET
Eosinophils Absolute: 0.1 10*3/uL (ref 0.0–0.7)
Hemoglobin: 14 g/dL (ref 12.0–15.0)
Lymphocytes Relative: 17 % (ref 12–46)
Lymphs Abs: 2.4 10*3/uL (ref 0.7–4.0)
Monocytes Relative: 8 % (ref 3–12)
Neutro Abs: 10.1 10*3/uL — ABNORMAL HIGH (ref 1.7–7.7)
Neutrophils Relative %: 74 % (ref 43–77)
Platelets: 220 10*3/uL (ref 150–400)
RBC: 4.56 MIL/uL (ref 3.87–5.11)
WBC: 13.7 10*3/uL — ABNORMAL HIGH (ref 4.0–10.5)

## 2012-05-16 MED ORDER — KETOROLAC TROMETHAMINE 60 MG/2ML IM SOLN
60.0000 mg | Freq: Once | INTRAMUSCULAR | Status: AC
  Administered 2012-05-16: 60 mg via INTRAMUSCULAR
  Filled 2012-05-16: qty 2

## 2012-05-16 MED ORDER — CEFTRIAXONE SODIUM 250 MG IJ SOLR
250.0000 mg | Freq: Once | INTRAMUSCULAR | Status: AC
  Administered 2012-05-16: 250 mg via INTRAMUSCULAR
  Filled 2012-05-16: qty 250

## 2012-05-16 MED ORDER — FLEET ENEMA 7-19 GM/118ML RE ENEM
1.0000 | ENEMA | Freq: Once | RECTAL | Status: AC
  Administered 2012-05-16: 1 via RECTAL

## 2012-05-16 MED ORDER — ONDANSETRON 8 MG PO TBDP
8.0000 mg | ORAL_TABLET | Freq: Once | ORAL | Status: AC
  Administered 2012-05-16: 8 mg via ORAL
  Filled 2012-05-16: qty 1

## 2012-05-16 MED ORDER — METRONIDAZOLE 500 MG PO TABS: 500.0000 mg | ORAL_TABLET | Freq: Two times a day (BID) | ORAL | Status: DC

## 2012-05-16 MED ORDER — AZITHROMYCIN 250 MG PO TABS
1000.0000 mg | ORAL_TABLET | Freq: Once | ORAL | Status: AC
  Administered 2012-05-16: 1000 mg via ORAL
  Filled 2012-05-16: qty 4

## 2012-05-16 NOTE — ED Notes (Signed)
Pt states she has been having abdominal pain for several days and having vaginal bleeding for a week. Also states that she has not had a normal bowel movement for 10-11 days.

## 2012-05-16 NOTE — ED Notes (Signed)
Pt was place in hospital gown. Triage completed by RN. VS WNL. NAD noted at this time.

## 2012-05-16 NOTE — ED Provider Notes (Signed)
History     CSN: [id number]  Arrival date & time 05/16/12  1005   First MD Initiated Contact with Patient 05/16/12 1028      Chief Complaint  Patient presents with  . Abdominal Pain    (Consider location/radiation/quality/duration/timing/severity/associated sxs/prior treatment) Patient is a 32 y.o. female presenting with abdominal pain. The history is provided by the patient.  Abdominal Pain Pain location:  LLQ, RLQ and epigastric Pain quality: bloating and cramping   Pain radiation: pelvic and vaginal area. Onset quality:  Gradual Duration:  4 days Timing:  Constant Progression:  Unchanged Chronicity:  New Relieved by:  Nothing Worsened by:  Eating Ineffective treatments:  Flatus and acetaminophen (stool softners) Associated symptoms: chills, constipation, fever, nausea, vaginal bleeding, vaginal discharge and vomiting   Associated symptoms: no diarrhea and no dysuria   Risk factors: no aspirin use and no NSAID use    Angelica Lopez is a 32 y.o. female who presents to the ED with abdominal pain. The pain started 4 days ago. She describes the pain as cramping and bloating. Had similar problem 3 years ago and dx with PID. Patent also reports no BM in about 2 weeks. Took a stool softer last night without results. She is having vaginal bleeding which is unusual because she has the [place] for birth control. Current sex partner off and on x 1 year. Was off for several weeks until 05/08/12, had unprotected sex and symptoms started after that. The history was provided by the patient.  Past Medical History  Diagnosis Date  . Hypertension   . Kyphosis   . Scheuermann's disease     Past Surgical History  Procedure Laterality Date  . Cesarean section      No family history on file.  History  Substance Use Topics  . Smoking status: Current Every Day Smoker -- 1.00 packs/day    Types: Cigarettes  . Smokeless tobacco: Not on file  . Alcohol Use: No    OB History   Grav Para  Term Preterm Abortions TAB SAB Ect Mult Living                  Review of Systems  Constitutional: Positive for fever and chills. Negative for activity change.  HENT: Negative for congestion, neck pain and neck stiffness.   Eyes: Negative for visual disturbance.  Gastrointestinal: Positive for nausea, vomiting, abdominal pain and constipation. Negative for diarrhea.  Genitourinary: Positive for vaginal bleeding and vaginal discharge. Negative for dysuria.  Musculoskeletal: Negative for back pain.  Skin: Negative for rash.  Neurological: Positive for light-headedness. Negative for headaches.  Psychiatric/Behavioral: Negative for confusion. The patient is not nervous/anxious.     Allergies  Codeine  Home Medications   Current Outpatient Rx  Name  Route  Sig  Dispense  Refill  . buprenorphine-naloxone (SUBOXONE) 8-2 MG SUBL   Sublingual   Place 2 tablets under the tongue 2 (two) times daily.         Marland Kitchen docusate sodium (COLACE) 100 MG capsule   Oral   Take 100 mg by mouth 2 (two) times daily.         Marland Kitchen ibuprofen (ADVIL,MOTRIN) 200 MG tablet   Oral   Take 400 mg by mouth every 6 (six) hours as needed for pain.         Marland Kitchen levonorgestrel (MIRENA) 20 MCG/24HR IUD   Intrauterine   1 each by Intrauterine route once.         Marland Kitchen lisinopril (  PRINIVIL,ZESTRIL) 10 MG tablet   Oral   Take 10 mg by mouth daily.           BP 150/99  Pulse 104  Temp(Src) 98.5 F (36.9 C) (Oral)  Resp 20  Ht 5\' 2"  (1.575 m)  Wt 150 lb (68.04 kg)  BMI 27.43 kg/m2  SpO2 100%  LMP 05/16/2012  Physical Exam  Nursing note and vitals reviewed. Constitutional: She is oriented to person, place, and time. She appears well-developed and well-nourished.  HENT:  Head: Normocephalic.  Eyes: EOM are normal.  Neck: Neck supple.  Cardiovascular: Tachycardia present.   Pulmonary/Chest: Effort normal and breath sounds normal. No respiratory distress.  Abdominal: Soft. Bowel sounds are normal. There  is generalized tenderness. There is no rebound, no guarding and no CVA tenderness.  Tender in all 4 quadrants but increased in lower abdomen.  Genitourinary: Rectal exam shows no tenderness.  External genitalia without lesions. Yellow bloody discarge vaginal vault. Positive CMT, bilateral adnexal tenderness. Uterus without palpable enlargement.  Rectal exam = hard stool palpated.   Musculoskeletal: Normal range of motion.  Neurological: She is alert and oriented to person, place, and time. No cranial nerve deficit.  Skin: Skin is warm and dry.  Psychiatric: She has a normal mood and affect. Her behavior is normal. Judgment and thought content normal.   Results for orders placed during the hospital encounter of 05/16/12 (from the past 24 hour(s))  WET PREP, GENITAL     Status: Abnormal   Collection Time    05/16/12 11:15 AM      Result Value Range   Yeast Wet Prep HPF POC NONE SEEN  NONE SEEN   Trich, Wet Prep NONE SEEN  NONE SEEN   Clue Cells Wet Prep HPF POC FEW (*) NONE SEEN   WBC, Wet Prep HPF POC MANY (*) NONE SEEN  CBC WITH DIFFERENTIAL     Status: Abnormal   Collection Time    05/16/12 11:20 AM      Result Value Range   WBC 13.7 (*) 4.0 - 10.5 K/uL   RBC 4.56  3.87 - 5.11 MIL/uL   Hemoglobin 14.0  12.0 - 15.0 g/dL   HCT 96.0  45.4 - 09.8 %   MCV 89.7  78.0 - 100.0 fL   MCH 30.7  26.0 - 34.0 pg   MCHC 34.2  30.0 - 36.0 g/dL   RDW 11.9  14.7 - 82.9 %   Platelets 220  150 - 400 K/uL   Neutrophils Relative 74  43 - 77 %   Neutro Abs 10.1 (*) 1.7 - 7.7 K/uL   Lymphocytes Relative 17  12 - 46 %   Lymphs Abs 2.4  0.7 - 4.0 K/uL   Monocytes Relative 8  3 - 12 %   Monocytes Absolute 1.1 (*) 0.1 - 1.0 K/uL   Eosinophils Relative 1  0 - 5 %   Eosinophils Absolute 0.1  0.0 - 0.7 K/uL   Basophils Relative 0  0 - 1 %   Basophils Absolute 0.0  0.0 - 0.1 K/uL  COMPREHENSIVE METABOLIC PANEL     Status: Abnormal   Collection Time    05/16/12 11:20 AM      Result Value Range   Sodium  132 (*) 135 - 145 mEq/L   Potassium 3.4 (*) 3.5 - 5.1 mEq/L   Chloride 94 (*) 96 - 112 mEq/L   CO2 28  19 - 32 mEq/L   Glucose, Bld 102 (*) 70 -  99 mg/dL   BUN 12  6 - 23 mg/dL   Creatinine, Ser 8.65  0.50 - 1.10 mg/dL   Calcium 9.5  8.4 - 78.4 mg/dL   Total Protein 7.9  6.0 - 8.3 g/dL   Albumin 3.6  3.5 - 5.2 g/dL   AST 14  0 - 37 U/L   ALT 9  0 - 35 U/L   Alkaline Phosphatase 70  39 - 117 U/L   Total Bilirubin 0.8  0.3 - 1.2 mg/dL   GFR calc non Af Amer >90  >90 mL/min   GFR calc Af Amer >90  >90 mL/min  URINALYSIS, ROUTINE W REFLEX MICROSCOPIC     Status: Abnormal   Collection Time    05/16/12 11:21 AM      Result Value Range   Color, Urine YELLOW  YELLOW   APPearance CLEAR  CLEAR   Specific Gravity, Urine <1.005 (*) 1.005 - 1.030   pH 6.5  5.0 - 8.0   Glucose, UA NEGATIVE  NEGATIVE mg/dL   Hgb urine dipstick NEGATIVE  NEGATIVE   Bilirubin Urine NEGATIVE  NEGATIVE   Ketones, ur NEGATIVE  NEGATIVE mg/dL   Protein, ur NEGATIVE  NEGATIVE mg/dL   Urobilinogen, UA 0.2  0.0 - 1.0 mg/dL   Nitrite NEGATIVE  NEGATIVE   Leukocytes, UA NEGATIVE  NEGATIVE  POCT PREGNANCY, URINE     Status: None   Collection Time    05/16/12 11:24 AM      Result Value Range   Preg Test, Ur NEGATIVE  NEGATIVE     ED Course  Procedures (including critical care time)  Patient felt better after Fleet enema and BM. Less pain.   MDM  32 y.o. female with pelvic pain and constipation, abnormal vaginal bleeding. Will treat for PID and constipation. She received a Fleet enema,  Rocephin 250 mg. IM, Zithromax 1 gram PO and Toradol 60 mg IM. She is feeling much better prior to discharge. I have reviewed this patient's vital signs, nurses notes, appropriate labs and discussed findings with the patient and plan of care. Patient voices understanding. Patient stable for discharge home without immediate complications.    Medication List    TAKE these medications       metroNIDAZOLE 500 MG tablet  Commonly  known as:  FLAGYL  Take 1 tablet (500 mg total) by mouth 2 (two) times daily.      ASK your doctor about these medications       docusate sodium 100 MG capsule  Commonly known as:  COLACE  Take 100 mg by mouth 2 (two) times daily.     ibuprofen 200 MG tablet  Commonly known as:  ADVIL,MOTRIN  Take 400 mg by mouth every 6 (six) hours as needed for pain.     levonorgestrel 20 MCG/24HR IUD  Commonly known as:  MIRENA  1 each by Intrauterine route once.     lisinopril 10 MG tablet  Commonly known as:  PRINIVIL,ZESTRIL  Take 10 mg by mouth daily.     SUBOXONE 8-2 MG Subl  Generic drug:  buprenorphine-naloxone  Place 2 tablets under the tongue 2 (two) times daily.               Janne Napoleon, [place] 05/16/12 [phone]

## 2012-05-19 ENCOUNTER — Telehealth (HOSPITAL_COMMUNITY): Payer: Self-pay | Admitting: Emergency Medicine

## 2012-05-20 NOTE — ED Provider Notes (Signed)
Medical screening examination/treatment/procedure(s) were performed by non-physician practitioner and as supervising physician I was immediately available for consultation/collaboration.   Shelda Jakes, MD 05/20/12 [phone]

## 2012-05-22 ENCOUNTER — Telehealth (HOSPITAL_COMMUNITY): Payer: Self-pay | Admitting: Emergency Medicine

## 2012-05-22 NOTE — ED Notes (Signed)
Chart returned from EDP office. Per Roxy Horseman PA-C, nothing further. Patient treated with Rocephin and Zithromax.

## 2012-05-24 ENCOUNTER — Encounter: Payer: Self-pay | Admitting: Obstetrics & Gynecology

## 2012-05-24 ENCOUNTER — Ambulatory Visit (INDEPENDENT_AMBULATORY_CARE_PROVIDER_SITE_OTHER): Payer: Medicaid Other | Admitting: Obstetrics & Gynecology

## 2012-05-24 VITALS — BP 118/60 | Ht 62.0 in | Wt 159.0 lb

## 2012-05-24 DIAGNOSIS — A549 Gonococcal infection, unspecified: Secondary | ICD-10-CM

## 2012-05-24 DIAGNOSIS — A5400 Gonococcal infection of lower genitourinary tract, unspecified: Secondary | ICD-10-CM

## 2012-05-24 MED ORDER — CIPROFLOXACIN HCL 500 MG PO TABS: 500.0000 mg | ORAL_TABLET | Freq: Two times a day (BID) | ORAL | Status: DC

## 2012-05-24 NOTE — Patient Instructions (Signed)
Gonorrhea, Females and Males  Gonorrhea is an infection. Gonorrhea can be treated with medicines that kill germs (antibiotics). It is necessary that all your sexual partners also be tested for infection and possibly be treated.   CAUSES   Gonorrhea is caused by a germ (bacteria) called Neisseria gonorrhoeae. This infection is spread by sexual contact. The contact that spreads gonorrhea from person to person may be oral, anal, or genital sex.  SYMPTOMS   Females  A woman may have gonorrhea infection and no symptoms. The most common symptoms are:   Pain in the lower abdomen.   Fever, with or without chills.  When these are the most serious problems, the illness is commonly called pelvic inflammatory disease (PID). Other symptoms include:   Abnormal vaginal discharge.   Painful intercourse.   Burning or itching of the vagina or lips of the vagina.   Abnormal vaginal bleeding.   Pain when urinating.  If the infection is spread by anal sex:   Irritation, pain, bleeding, or discharge from the rectum.  If the infection is spread by oral sex with either a man or a woman:   Sore throat, fever, and swollen neck lymph glands.  Other problems may include:   Long-lasting (chronic) pain in the lower abdomen during menstruation, intercourse, or at other times.   Inability to become pregnant.   Premature birth.   Passing the infection onto a newborn baby. This can cause an eye infection in the infant or more serious health problems.  Males  Less frequently than in women, men may have gonorrhea infection and no symptoms. The most common symptoms are:   Discharge from the penis.   Pain or burning during urination.  If the infection is spread by anal sex:   Irritation, pain, bleeding, or discharge from the rectum.  If the infection is spread by oral sex with either a man or a woman:   Sore throat, fever, and swollen neck lymph glands.  DIAGNOSIS   Diagnosis is made by exam of the patient and checking a sample of  discharge under a microscope for the presence of the bacteria. Discharge may be taken from the urethra, cervix, throat, or rectum.  TREATMENT   It is important to diagnose and treat gonorrhea as soon as possible. This prevents damage to the female or female organs or harm to the newborn baby of an infected woman.   Antibiotics are used to treat gonorrhea.   Your sex partners should also be examined and treated if needed.   Testing and treatment for other sexually transmitted diseases (STDs) may be done when you are diagnosed with gonorrhea. Gonorrhea is an STD. You are at risk for other STDs, which are often transmitted around the same time as gonorrhea. These include:   Chlamydia.   Syphilis.   Trichomonas.   Human papillomavirus (HPV).   Human immunodeficiency virus (HIV).   If left untreated, PID can cause women to be unable to have children (sterile). To prevent sterility in females, it is important to be treated as soon as possible and finish all medicines. Unfortunately, sterility or pregnancy occurring outside the uterus (ectopic) may still occur in fully treated women.  HOME CARE INSTRUCTIONS    Finish all medicine as prescribed. Incomplete treatment will put you at risk for continued infection.   Only take over-the-counter or prescription medicines for pain, discomfort, or fever as directed by your caregiver.   Do not have sex until treatment is completed, or as instructed   by your caregiver.   Follow up with your caregiver as directed.   If you test positive for gonorrhea, inform your recent sexual partners. They may need an exam and treatment, even if they have no symptoms. They may need treatment even if they test negative for gonorrhea.  Finding out the results of your test  Not all test results are available during your visit. If your test results are not back during the visit, make an appointment with your caregiver to find out the results. Do not assume everything is normal if you have not  heard from your caregiver or the medical facility. It is important for you to follow up on all of your test results.  SEEK MEDICAL CARE IF:    You develop any bad reaction to the medicine you were prescribed. This may include:   Rash.   Nausea.   Vomiting.   Diarrhea.   You have an oral temperature above 102 F (38.9 C).   You have symptoms that do not improve, symptoms that get worse, or you develop increased pain. Males may get pain in the testicles and females may get increased abdominal pain.  MAKE SURE YOU:    Understand these instructions.   Will watch your condition.   Will get help right away if you are not doing well or get worse.  Document Released: 01/24/2000 Document Revised: 04/20/2011 Document Reviewed: 05/28/2009  [employer] Patient Information 2013 [employer]

## 2012-05-24 NOTE — Progress Notes (Signed)
Patient ID: Angelica Lopez, female   DOB: 02-20-1980, 32 y.o.   MRN: [id number] mirena IUD in place Positive gonorrhea from ER, treated but had sex with partner before knew test results  Cipro 500 bid x 7 days  Follow up 3 weeks for test of cure evaluation

## 2012-06-14 ENCOUNTER — Ambulatory Visit (INDEPENDENT_AMBULATORY_CARE_PROVIDER_SITE_OTHER): Payer: Medicaid Other | Admitting: Obstetrics & Gynecology

## 2012-06-14 ENCOUNTER — Encounter: Payer: Self-pay | Admitting: Obstetrics & Gynecology

## 2012-06-14 VITALS — BP 100/80 | Wt 160.0 lb

## 2012-06-14 DIAGNOSIS — A5400 Gonococcal infection of lower genitourinary tract, unspecified: Secondary | ICD-10-CM

## 2012-06-14 DIAGNOSIS — A549 Gonococcal infection, unspecified: Secondary | ICD-10-CM

## 2012-06-14 NOTE — Progress Notes (Signed)
Patient ID: Angelica Lopez, female   DOB: 07-23-1980, 32 y.o.   MRN: [id number] Patient states her pain is much better, not nearly like it was IUD in place. No pain No sex since being seen  Blood pressure 100/80, weight 160 lb (72.576 kg), last menstrual period 05/16/2012.  Exam No CMT  No discharge No left adnexal pain  Gen probe done  Follow up prn  EURE,LUTHER H 06/14/2012 11:50 AM

## 2012-06-15 LAB — GC/CHLAMYDIA PROBE AMP: GC Probe RNA: NEGATIVE

## 2012-10-09 ENCOUNTER — Encounter (HOSPITAL_COMMUNITY): Payer: Self-pay | Admitting: *Deleted

## 2012-10-09 ENCOUNTER — Emergency Department (HOSPITAL_COMMUNITY)
Admission: EM | Admit: 2012-10-09 | Discharge: 2012-10-09 | Disposition: A | Discharge status: Home / Self Care | Payer: Medicaid Other | Attending: Emergency Medicine | Admitting: Emergency Medicine

## 2012-10-09 DIAGNOSIS — F17200 Nicotine dependence, unspecified, uncomplicated: Secondary | ICD-10-CM | POA: Insufficient documentation

## 2012-10-09 DIAGNOSIS — Z79899 Other long term (current) drug therapy: Secondary | ICD-10-CM | POA: Insufficient documentation

## 2012-10-09 DIAGNOSIS — M25473 Effusion, unspecified ankle: Secondary | ICD-10-CM | POA: Insufficient documentation

## 2012-10-09 DIAGNOSIS — M549 Dorsalgia, unspecified: Secondary | ICD-10-CM | POA: Insufficient documentation

## 2012-10-09 DIAGNOSIS — Z792 Long term (current) use of antibiotics: Secondary | ICD-10-CM | POA: Insufficient documentation

## 2012-10-09 DIAGNOSIS — M25476 Effusion, unspecified foot: Secondary | ICD-10-CM | POA: Insufficient documentation

## 2012-10-09 DIAGNOSIS — I10 Essential (primary) hypertension: Secondary | ICD-10-CM | POA: Insufficient documentation

## 2012-10-09 DIAGNOSIS — R109 Unspecified abdominal pain: Secondary | ICD-10-CM | POA: Insufficient documentation

## 2012-10-09 DIAGNOSIS — E876 Hypokalemia: Secondary | ICD-10-CM | POA: Insufficient documentation

## 2012-10-09 LAB — CBC WITH DIFFERENTIAL/PLATELET
Basophils Absolute: 0.1 10*3/uL (ref 0.0–0.1)
Basophils Relative: 1 % (ref 0–1)
Eosinophils Absolute: 0.4 10*3/uL (ref 0.0–0.7)
Eosinophils Relative: 4 % (ref 0–5)
HCT: 37.1 % (ref 36.0–46.0)
Hemoglobin: 12.8 g/dL (ref 12.0–15.0)
Lymphs Abs: 3.7 10*3/uL (ref 0.7–4.0)
Neutrophils Relative %: 54 % (ref 43–77)
WBC: 10.8 10*3/uL — ABNORMAL HIGH (ref 4.0–10.5)

## 2012-10-09 LAB — COMPREHENSIVE METABOLIC PANEL
ALT: 14 U/L (ref 0–35)
AST: 22 U/L (ref 0–37)
Albumin: 3.3 g/dL — ABNORMAL LOW (ref 3.5–5.2)
Alkaline Phosphatase: 58 U/L (ref 39–117)
Calcium: 8.9 mg/dL (ref 8.4–10.5)
GFR calc Af Amer: >90 mL/min (ref >90)
Potassium: 3 mEq/L — ABNORMAL LOW (ref 3.5–5.1)
Sodium: 137 mEq/L (ref 135–145)
Total Protein: 6.5 g/dL (ref 6.0–8.3)

## 2012-10-09 MED ORDER — KETOROLAC TROMETHAMINE 30 MG/ML IJ SOLN
30.0000 mg | Freq: Once | INTRAMUSCULAR | Status: AC
Start: 2012-10-09 — End: 2012-10-09
  Administered 2012-10-09: 30 mg via INTRAVENOUS
  Filled 2012-10-09: qty 1

## 2012-10-09 MED ORDER — POTASSIUM CHLORIDE ER 10 MEQ PO TBCR: 10.0000 meq | EXTENDED_RELEASE_TABLET | Freq: Two times a day (BID) | ORAL | Status: DC

## 2012-10-09 MED ORDER — NAPROXEN 500 MG PO TABS: ORAL_TABLET | ORAL | Status: DC

## 2012-10-09 MED ORDER — POTASSIUM CHLORIDE CRYS ER 20 MEQ PO TBCR
40.0000 meq | EXTENDED_RELEASE_TABLET | Freq: Once | ORAL | Status: AC
  Administered 2012-10-09: 40 meq via ORAL
  Filled 2012-10-09: qty 2

## 2012-10-09 NOTE — ED Notes (Addendum)
No appreciable edema of ankles, but tenderness to calves on palpation as well as to abdomen.  Patient related she is on Suboxone for prior opioid addiction. Took additional dose to attempt to help pain.

## 2012-10-09 NOTE — ED Provider Notes (Signed)
CSN: [id number]     Arrival date & time 10/09/12  0918 History  This chart was scribed for Angelica Lennert, MD by Caryn Bee, ED Scribe and Greggory Stallion, ED Scribe. This patient was seen in room APA07/APA07 and the patient's care was started at 9:49 AM.    Chief Complaint  Patient presents with  . Generalized Body Aches  . Joint Swelling    Patient is a 32 y.o. female presenting with weakness. The history is provided by the patient. No language interpreter was used.  Weakness This is a new problem. The current episode started more than 2 days ago. The problem occurs constantly. The problem has not changed since onset.Associated symptoms include abdominal pain. Pertinent negatives include no chest pain and no headaches. Exacerbated by: palpation. Nothing relieves the symptoms. She has tried nothing for the symptoms.   HPI Comments: JANAYA BROY is a 32 y.o. female who presents to the Emergency Department complaining of generalized body aches that began 4 days ago with associated bilateral ankle swelling after being incarcerated for 15 days. She thinks the pain is due to housework. Pt states that yesterday the pain was bad in her back and today her pain is worse in her abdomen and legs. Pt states she had 7 episodes of emesis per day. Yesterday was the first time that she was able to keep food down.   Past Medical History  Diagnosis Date  . Hypertension   . Kyphosis   . Scheuermann's disease    Past Surgical History  Procedure Laterality Date  . Cesarean section    . Tonsillectomy     Family History  Problem Relation Age of Onset  . Hypertension Mother   . Hypertension Father   . Diabetes Father   . Heart disease Paternal Grandmother    History  Substance Use Topics  . Smoking status: Current Every Day Smoker -- 1.00 packs/day    Types: Cigarettes  . Smokeless tobacco: Not on file  . Alcohol Use: No   OB History   Grav Para Term Preterm Abortions TAB SAB Ect Mult  Living                 Review of Systems  Constitutional: Negative for appetite change and fatigue.  HENT: Negative for congestion, sinus pressure and ear discharge.   Eyes: Negative for discharge.  Respiratory: Negative for cough.   Cardiovascular: Negative for chest pain.  Gastrointestinal: Positive for abdominal pain. Negative for diarrhea.  Genitourinary: Negative for frequency and hematuria.  Musculoskeletal: Positive for myalgias, back pain and joint swelling.  Skin: Negative for rash.  Neurological: Positive for weakness. Negative for seizures and headaches.  Psychiatric/Behavioral: Negative for hallucinations.    Allergies  Codeine  Home Medications   Current Outpatient Rx  Name  Route  Sig  Dispense  Refill  . buprenorphine-naloxone (SUBOXONE) 8-2 MG SUBL   Sublingual   Place 2 tablets under the tongue 2 (two) times daily.         . ciprofloxacin (CIPRO) 500 MG tablet   Oral   Take 1 tablet (500 mg total) by mouth 2 (two) times daily.   20 tablet   0   . docusate sodium (COLACE) 100 MG capsule   Oral   Take 100 mg by mouth 2 (two) times daily.         Marland Kitchen ibuprofen (ADVIL,MOTRIN) 200 MG tablet   Oral   Take 400 mg by mouth every 6 (six) hours as  needed for pain.         Marland Kitchen levonorgestrel (MIRENA) 20 MCG/24HR IUD   Intrauterine   1 each by Intrauterine route once.         Marland Kitchen lisinopril (PRINIVIL,ZESTRIL) 10 MG tablet   Oral   Take 10 mg by mouth daily.         . metroNIDAZOLE (FLAGYL) 500 MG tablet   Oral   Take 1 tablet (500 mg total) by mouth 2 (two) times daily.   14 tablet   0    BP 134/86  Pulse 108  Temp(Src) 98.7 F (37.1 C) (Oral)  Ht 5\' 1"  (1.549 m)  Wt 145 lb (65.772 kg)  BMI 27.41 kg/m2  SpO2 100% Physical Exam  Constitutional: She is oriented to person, place, and time. She appears well-developed.  HENT:  Head: Normocephalic.  Eyes: Conjunctivae and EOM are normal. No scleral icterus.  Neck: Neck supple. No thyromegaly  present.  Cardiovascular: Normal rate and regular rhythm.  Exam reveals no gallop and no friction rub.   No murmur heard. Pulmonary/Chest: No stridor. She has no wheezes. She has no rales. She exhibits no tenderness.  Abdominal: She exhibits no distension. There is tenderness. There is no rebound.  Abdomen mildly tender   Musculoskeletal: Normal range of motion. She exhibits no edema.  Mild tenderness through upper back. Tenderness in upper and lower extremities.  Lymphadenopathy:    She has no cervical adenopathy.  Neurological: She is oriented to person, place, and time. Coordination normal.  Skin: No rash noted. No erythema.  Psychiatric: She has a normal mood and affect. Her behavior is normal.    ED Course  Procedures (including critical care time) DIAGNOSTIC STUDIES: Oxygen Saturation is 100% on room air, normal by my interpretation.    COORDINATION OF CARE: 10:02 AM-Discussed treatment plan which includes medication and blood tests with pt at bedside and pt agreed to plan.     Labs Review Labs Reviewed  CBC WITH DIFFERENTIAL - Abnormal; Notable for the following:    WBC 10.8 (*)    All other components within normal limits  COMPREHENSIVE METABOLIC PANEL - Abnormal; Notable for the following:    Potassium 3.0 (*)    Glucose, Bld 108 (*)    Albumin 3.3 (*)    Total Bilirubin <0.1 (*)    All other components within normal limits  CK   Imaging Review No results found.  MDM  No diagnosis found. hypokalemia    The chart was scribed for me under my direct supervision.  I personally performed the history, physical, and medical decision making and all procedures in the evaluation of this patient.Angelica Lennert, MD 10/09/12 [phone]

## 2012-10-09 NOTE — ED Notes (Addendum)
Patient recently released from prison presents w/muscle aches and cramping  x 4 days.  Also noticed ankle swelling bilaterally.  First two days she had vomiting and extreme thirst.  No sick contacts, no insect bites recalled.

## 2013-04-12 ENCOUNTER — Ambulatory Visit (HOSPITAL_COMMUNITY)
Admission: RE | Admit: 2013-04-12 | Discharge: 2013-04-12 | Disposition: A | Discharge status: Home / Self Care | Payer: Medicaid Other | Source: Ambulatory Visit | Attending: Orthopedic Surgery | Admitting: Orthopedic Surgery

## 2013-04-12 ENCOUNTER — Other Ambulatory Visit: Payer: Self-pay | Admitting: Orthopedic Surgery

## 2013-04-12 DIAGNOSIS — M549 Dorsalgia, unspecified: Secondary | ICD-10-CM | POA: Insufficient documentation

## 2013-04-12 DIAGNOSIS — M4000 Postural kyphosis, site unspecified: Secondary | ICD-10-CM | POA: Insufficient documentation

## 2013-04-13 ENCOUNTER — Ambulatory Visit: Payer: Medicaid Other | Admitting: Orthopedic Surgery

## 2013-04-25 ENCOUNTER — Ambulatory Visit (INDEPENDENT_AMBULATORY_CARE_PROVIDER_SITE_OTHER): Payer: Medicaid Other | Admitting: Orthopedic Surgery

## 2013-04-25 VITALS — BP 113/72 | Ht 60.0 in | Wt 183.0 lb

## 2013-04-25 DIAGNOSIS — M4200 Juvenile osteochondrosis of spine, site unspecified: Secondary | ICD-10-CM

## 2013-04-25 NOTE — Progress Notes (Signed)
Patient ID: Angelica Lopez, female   DOB: [birth year], 33 y.o.   MRN: [id number]  Chief Complaint  Patient presents with  . Back Pain    Mid back pain for 4 years, no injury.Consult from  Dr. Cindie Laroche    HISTORY:  33 year old female who always noticed that she had abnormal posture presents with a 4 year history of pain in her thoracic spine radiating down to her tailbone. She remembers symptoms starting after her last child was born.   She denies numbness tingling or gait disturbance. She has trouble standing at the sink to wash dishes has trouble vacuuming or doing chores. Review of systems as recorded  Medical history as recorded  She denies any bowel or bladder dysfunction night pain or weight loss  Vital signs:   General the patient is well-developed and well-nourished grooming and hygiene are normal Oriented x3 Mood and affect normal Ambulation normal  Inspection of the thoracolumbar spine shows obvious kyphosis but normal flexion extension without pain  Distal neurological exam is normal including reflexes 2+ and equal bilaterally at the knee and ankle. Hip range of motion normal bilaterally. Muscle strength normal in both lower extremities Full range of motion All joints are stable Thoracolumbar spine Skin clean dry and intact  Cardiovascular exam is normal Sensory exam normal  MRI and x-rays show what appears to be thoracic kyphosis consistent with Scheuermann's disease  Recommend physical therapy  No surgical intervention needed  Patient advised to maintain good bone health with vitamin D and calcium as well as good posture and mechanics. Patient education related to this disease also given.

## 2013-04-25 NOTE — Patient Instructions (Signed)
Call to arrange therapy at [facility] 

## 2013-05-02 ENCOUNTER — Ambulatory Visit (HOSPITAL_COMMUNITY)
Admission: RE | Admit: 2013-05-02 | Discharge: 2013-05-02 | Disposition: A | Discharge status: Home / Self Care | Payer: Medicaid Other | Source: Ambulatory Visit | Attending: Orthopedic Surgery | Admitting: Orthopedic Surgery

## 2013-05-02 DIAGNOSIS — M545 Low back pain, unspecified: Secondary | ICD-10-CM | POA: Insufficient documentation

## 2013-05-02 DIAGNOSIS — IMO0001 Reserved for inherently not codable concepts without codable children: Secondary | ICD-10-CM | POA: Insufficient documentation

## 2013-05-02 DIAGNOSIS — G8929 Other chronic pain: Secondary | ICD-10-CM | POA: Insufficient documentation

## 2013-05-02 DIAGNOSIS — I10 Essential (primary) hypertension: Secondary | ICD-10-CM | POA: Insufficient documentation

## 2013-05-02 NOTE — Evaluation (Signed)
Physical Therapy Evaluation  Patient Details  Name: Angelica Lopez MRN: [id number] Date of Birth: 21-Nov-1980  Today's Date: 05/02/2013 Time: 1410-1450 PT Time Calculation (min): 40 min   Charges : PT evaluation            Visit#: 1 of 1  Re-eval:   Assessment Diagnosis: low back pain/Scheurmanns disease  Next MD Visit: Dr. Aline Brochure  Prior Therapy: no   Authorization: medicaid     Authorization Time Period:    Authorization Visit#: 1 of 1   Past Medical History:  Past Medical History  Diagnosis Date  . Hypertension   . Kyphosis   . Scheuermann's disease    Past Surgical History:  Past Surgical History  Procedure Laterality Date  . Cesarean section    . Tonsillectomy      Subjective Symptoms/Limitations Symptoms: low back pain over past 4 years, especaill;y after having her third child via c sections, onset and dx of Scheurmanns disease 2 years ago, back pain worse during ADLS of cleaning/vaccumming, no prior PT  Pertinent History: scheurmanns disease, hx of pevic inflammatory disease , has used ER in past per back pain  Limitations: House hold activities Special Tests: x rays in chart  Patient Stated Goals: decrease pain  Pain Assessment Currently in Pain?: Yes Pain Score:  (0- 8/10 ) Pain Location: Back Pain Orientation: Lower Pain Frequency: Intermittent Pain Relieving Factors: ibprofen  Effect of Pain on Daily Activities: sleep disturbance, pain durnig household work       Prior Functioning  Prior Function Level of Independence: Independent with basic ADLs;Independent with gait, 2 year x of back pain during ADLS  Comments: mother of 3   Cognition/Observation Observation/Other Assessments Observations: right shoulder higher, thoracic kyphosis, thoraic rotation R   Sensation/Coordination/Flexibility/Functional Tests Flexibility Thomas: Positive 90/90: Positive  Assessment Lumbar Assessment Lumbar Assessment: Within Functional Limits MMT B LE  grossly 4/5  Thoracic rotation pain with rotation left  Negative for pain with thoracic palpation  Decreased pain with grade 2 lumbar PA mobs  Decreased B hip PROM internal rotation B hamstring decreased flexibility  Negative SLR  Exercise/Treatments     Stretches Active Hamstring Stretch: 2 reps;30 seconds   Supine Bent Knee Raise: 10 reps Dead Bug: 10 reps Other Supine Lumbar Exercises: supine leg table top heel taps 10x, ab bicycle/leg extension 10x   Physical Therapy Assessment and Plan PT Assessment and Plan Clinical Impression Statement: 33 year old female referred for low back pain related to med dx of Scheurmanns disease. lumbar and thoraicic ROM WFL , however notable decrased hamstring flexibility. Patient provided HEP for body mechanics , core stability, and lumbar ROM.  patient can benefit from HEP for gastroc and hamstring flexibility as well as core strengthening  PT Plan: 1 time authorized visit  Per medicaid   Goals: 1 time visit, no goals established     Problem List Patient Active Problem List   Diagnosis Date Noted  . Low back pain 05/02/2013  . Scheurmann's disease 04/25/2013  . Gonorrhea 05/24/2012  . Burn 11/07/2010    PT - End of Session Activity Tolerance: Patient tolerated treatment well General Behavior During Therapy: [facility] for tasks assessed/performed PT Plan of Care PT Patient Instructions: written HEP provided  Consulted and Agree with Plan of Care: Patient  GP    Sarajane Marek 05/02/2013, 4:11 PM  Physician Documentation Your signature is required to indicate approval of the treatment plan as stated above.  Please sign and either send electronically or make  a copy of this report for your files and return this physician signed original.   Please mark one 1.__approve of plan  2. ___approve of plan with the following conditions.   ______________________________                                                           _____________________ Physician Signature                                                                                                             Date

## 2013-06-06 ENCOUNTER — Emergency Department (HOSPITAL_COMMUNITY)
Admission: EM | Admit: 2013-06-06 | Discharge: 2013-06-06 | Disposition: A | Discharge status: Home / Self Care | Payer: Medicaid Other | Attending: Emergency Medicine | Admitting: Emergency Medicine

## 2013-06-06 ENCOUNTER — Encounter (HOSPITAL_COMMUNITY): Payer: Self-pay | Admitting: Emergency Medicine

## 2013-06-06 ENCOUNTER — Emergency Department (HOSPITAL_COMMUNITY): Payer: Medicaid Other

## 2013-06-06 DIAGNOSIS — Z8739 Personal history of other diseases of the musculoskeletal system and connective tissue: Secondary | ICD-10-CM | POA: Insufficient documentation

## 2013-06-06 DIAGNOSIS — I10 Essential (primary) hypertension: Secondary | ICD-10-CM | POA: Insufficient documentation

## 2013-06-06 DIAGNOSIS — J209 Acute bronchitis, unspecified: Secondary | ICD-10-CM | POA: Insufficient documentation

## 2013-06-06 DIAGNOSIS — F17200 Nicotine dependence, unspecified, uncomplicated: Secondary | ICD-10-CM | POA: Insufficient documentation

## 2013-06-06 DIAGNOSIS — J40 Bronchitis, not specified as acute or chronic: Secondary | ICD-10-CM

## 2013-06-06 DIAGNOSIS — Z79899 Other long term (current) drug therapy: Secondary | ICD-10-CM | POA: Insufficient documentation

## 2013-06-06 LAB — CBC WITH DIFFERENTIAL/PLATELET
Basophils Absolute: 0 10*3/uL (ref 0.0–0.1)
Basophils Relative: 0 % (ref 0–1)
EOS ABS: 0.5 10*3/uL (ref 0.0–0.7)
Eosinophils Relative: 3 % (ref 0–5)
HCT: 41.3 % (ref 36.0–46.0)
Hemoglobin: 14 g/dL (ref 12.0–15.0)
Lymphocytes Relative: 16 % (ref 12–46)
Lymphs Abs: 2.2 10*3/uL (ref 0.7–4.0)
MCH: 29.9 pg (ref 26.0–34.0)
MCHC: 33.9 g/dL (ref 30.0–36.0)
MCV: 88.2 fL (ref 78.0–100.0)
Monocytes Absolute: 0.9 10*3/uL (ref 0.1–1.0)
Monocytes Relative: 7 % (ref 3–12)
NEUTROS ABS: 10.1 10*3/uL — AB (ref 1.7–7.7)
Neutrophils Relative %: 74 % (ref 43–77)
PLATELETS: 265 10*3/uL (ref 150–400)
RBC: 4.68 MIL/uL (ref 3.87–5.11)
RDW: 12.8 % (ref 11.5–15.5)
WBC: 13.6 10*3/uL — ABNORMAL HIGH (ref 4.0–10.5)

## 2013-06-06 LAB — D-DIMER, QUANTITATIVE (NOT AT ARMC)

## 2013-06-06 MED ORDER — BENZONATATE 100 MG PO CAPS
200.0000 mg | ORAL_CAPSULE | Freq: Once | ORAL | Status: AC
  Administered 2013-06-06: 200 mg via ORAL
  Filled 2013-06-06: qty 2

## 2013-06-06 MED ORDER — ALBUTEROL SULFATE HFA 108 (90 BASE) MCG/ACT IN AERS: 1.0000 | INHALATION_SPRAY | Freq: Four times a day (QID) | RESPIRATORY_TRACT | Status: DC | PRN

## 2013-06-06 MED ORDER — BENZONATATE 100 MG PO CAPS: 200.0000 mg | ORAL_CAPSULE | Freq: Three times a day (TID) | ORAL | Status: DC | PRN

## 2013-06-06 MED ORDER — AZITHROMYCIN 250 MG PO TABS: ORAL_TABLET | ORAL | Status: DC

## 2013-06-06 MED ORDER — PREDNISONE 10 MG PO TABS: ORAL_TABLET | ORAL | Status: DC

## 2013-06-06 MED ORDER — ALBUTEROL SULFATE (2.5 MG/3ML) 0.083% IN NEBU
2.5000 mg | INHALATION_SOLUTION | Freq: Once | RESPIRATORY_TRACT | Status: AC
  Administered 2013-06-06: 2.5 mg via RESPIRATORY_TRACT
  Filled 2013-06-06: qty 3

## 2013-06-06 MED ORDER — IPRATROPIUM-ALBUTEROL 0.5-2.5 (3) MG/3ML IN SOLN
3.0000 mL | Freq: Once | RESPIRATORY_TRACT | Status: AC
  Administered 2013-06-06: 3 mL via RESPIRATORY_TRACT
  Filled 2013-06-06: qty 3

## 2013-06-06 MED ORDER — PREDNISONE 50 MG PO TABS
60.0000 mg | ORAL_TABLET | Freq: Once | ORAL | Status: AC
  Administered 2013-06-06: 60 mg via ORAL
  Filled 2013-06-06 (×2): qty 1

## 2013-06-06 MED ORDER — ALBUTEROL SULFATE (2.5 MG/3ML) 0.083% IN NEBU
5.0000 mg | INHALATION_SOLUTION | Freq: Once | RESPIRATORY_TRACT | Status: AC
  Administered 2013-06-06: 5 mg via RESPIRATORY_TRACT
  Filled 2013-06-06: qty 6

## 2013-06-06 NOTE — ED Notes (Signed)
Cough,nonproductive, sinus congestion.

## 2013-06-06 NOTE — Discharge Instructions (Signed)
Bronchitis °Bronchitis is swelling (inflammation) of the air tubes leading to your lungs (bronchi). This causes mucus and a cough. If the swelling gets bad, you may have trouble breathing. °HOME CARE  °· Rest. °· Drink enough fluids to keep your pee (urine) clear or pale yellow (unless you have a condition where you have to watch how much you drink). °· Only take medicine as told by your doctor. If you were given antibiotic medicines, finish them even if you start to feel better. °· Avoid smoke, irritating chemicals, and strong smells. These make the problem worse. Quit smoking if you smoke. This helps your lungs heal faster. °· Use a cool mist humidifier. Change the water in the humidifier every day. You can also sit in the bathroom with hot shower running for 5 10 minutes. Keep the door closed. °· See your health care provider as told. °· Wash your hands often. °GET HELP IF: °Your problems do not get better after 1 week. °GET HELP RIGHT AWAY IF:  °· Your fever gets worse. °· You have chills. °· Your chest hurts. °· Your problems breathing get worse. °· You have blood in your mucus. °· You pass out (faint). °· You feel lightheaded. °· You have a bad headache. °· You throw up (vomit) again and again. °MAKE SURE YOU: °· Understand these instructions. °· Will watch your condition. °· Will get help right away if you are not doing well or get worse. °Document Released: 07/15/2007 Document Revised: 11/16/2012 Document Reviewed: 09/20/2012 °[employer]® Patient Information ©2014 [employer]. ° °

## 2013-06-06 NOTE — ED Notes (Signed)
Pt states that she started with runny nose  About 2 weeks ago and her neighbors had been mowing grass so she "just figured it was from that" and so she had been taking some Benadryl and Claritin for it. Today, she states she woke up and tried to smoke a cigarette but when she inhaled, she was unable to catch her breath. States she has been SOB ever since. States tried one of her neighbors nebulizer treatments but it did not help. Cough is dry and non-productive.

## 2013-06-06 NOTE — ED Provider Notes (Signed)
CSN: [id number]     Arrival date & time 06/06/13  1225 History   First MD Initiated Contact with Patient 06/06/13 1255     Chief Complaint  Patient presents with  . Cough     (Consider location/radiation/quality/duration/timing/severity/associated sxs/prior Treatment) Patient is a 33 y.o. female presenting with cough. The history is provided by the patient.  Cough Cough characteristics:  Non-productive, hacking and dry Severity:  Moderate Onset quality:  Gradual Duration:  2 weeks Timing:  Constant Progression:  Unchanged Chronicity:  New Smoker: yes   Context: upper respiratory infection and weather changes   Relieved by:  Nothing Ineffective treatments:  Beta-agonist inhaler Associated symptoms: shortness of breath, sinus congestion and wheezing   Associated symptoms: no chest pain, no chills, no ear pain, no fever, no headaches, no rash, no rhinorrhea and no sore throat   Associated symptoms comment:  Nasal congestion Shortness of breath:    Severity:  Mild   Onset quality:  Gradual   Timing:  Intermittent   Progression:  Waxing and waning Wheezing:    Severity:  Moderate   Onset quality:  Gradual   Timing:  Intermittent   Progression:  Waxing and waning   Chronicity:  Recurrent Risk factors: no recent infection and no recent travel     Past Medical History  Diagnosis Date  . Hypertension   . Kyphosis   . Scheuermann's disease    Past Surgical History  Procedure Laterality Date  . Cesarean section    . Tonsillectomy     Family History  Problem Relation Age of Onset  . Hypertension Mother   . Hypertension Father   . Diabetes Father   . Heart disease Paternal Grandmother    History  Substance Use Topics  . Smoking status: Current Every Day Smoker -- 1.00 packs/day    Types: Cigarettes  . Smokeless tobacco: Not on file  . Alcohol Use: No   OB History   Grav Para Term Preterm Abortions TAB SAB Ect Mult Living                 Review of Systems   Constitutional: Negative for fever, chills, activity change and appetite change.  HENT: Positive for congestion. Negative for ear pain, facial swelling, rhinorrhea, sore throat and trouble swallowing.   Eyes: Negative for visual disturbance.  Respiratory: Positive for cough, chest tightness, shortness of breath and wheezing. Negative for stridor.   Cardiovascular: Negative for chest pain.  Gastrointestinal: Negative for nausea, vomiting and abdominal pain.  Musculoskeletal: Negative for gait problem, neck pain and neck stiffness.  Skin: Negative.  Negative for rash.  Neurological: Negative for dizziness, syncope, weakness, numbness and headaches.  Hematological: Negative for adenopathy.  Psychiatric/Behavioral: Negative for confusion.  All other systems reviewed and are negative.     Allergies  Codeine  Home Medications   Prior to Admission medications   Medication Sig Start Date End Date Taking? Authorizing Provider  Buprenorphine HCl-Naloxone HCl (SUBOXONE) 4-1 MG FILM Place 1 tablet under the tongue 2 (two) times daily.   Yes Historical Provider, MD  lisinopril (PRINIVIL,ZESTRIL) 10 MG tablet Take 10 mg by mouth daily.   Yes Historical Provider, MD  loratadine (CLARITIN) 10 MG tablet Take 10 mg by mouth daily as needed for allergies.   Yes Historical Provider, MD  PARoxetine (PAXIL) 10 MG tablet Take 10 mg by mouth daily.   Yes Historical Provider, MD  ibuprofen (ADVIL,MOTRIN) 200 MG tablet Take 400 mg by mouth every 6 (six)  hours as needed for headache or mild pain.     Historical Provider, MD  levonorgestrel (MIRENA) 20 MCG/24HR IUD 1 each by Intrauterine route once.    Historical Provider, MD   BP 140/86  Pulse 100  Temp(Src) 98.5 F (36.9 C) (Oral)  Resp 18  Ht 5\' 1"  (1.549 m)  Wt 185 lb (83.915 kg)  BMI 34.97 kg/m2  SpO2 91% Physical Exam  Nursing note and vitals reviewed. Constitutional: She is oriented to person, place, and time. She appears well-developed and  well-nourished. No distress.  HENT:  Head: Normocephalic and atraumatic.  Right Ear: Tympanic membrane and ear canal normal.  Left Ear: Tympanic membrane and ear canal normal.  Nose: Mucosal edema and rhinorrhea present.  Mouth/Throat: Uvula is midline, oropharynx is clear and moist and mucous membranes are normal. No oropharyngeal exudate.  Eyes: EOM are normal. Pupils are equal, round, and reactive to light.  Neck: Normal range of motion, full passive range of motion without pain and phonation normal. Neck supple.  Cardiovascular: Normal rate, regular rhythm, normal heart sounds and intact distal pulses.   No murmur heard. Pulmonary/Chest: Effort normal. No stridor. No respiratory distress. She has wheezes. She has no rales. She exhibits no tenderness.  Inspiratory and expiratory wheezes throughout.  Lung sounds slightly diminished bilaterally, no rales  Abdominal: Soft. She exhibits no distension. There is no tenderness. There is no rebound.  Musculoskeletal: Normal range of motion. She exhibits no edema.  Lymphadenopathy:    She has no cervical adenopathy.  Neurological: She is alert and oriented to person, place, and time. She exhibits normal muscle tone. Coordination normal.  Skin: Skin is warm and dry.    ED Course  Procedures (including critical care time) Labs Review Labs Reviewed  CBC WITH DIFFERENTIAL - Abnormal; Notable for the following:    WBC 13.6 (*)    Neutro Abs 10.1 (*)    All other components within normal limits  D-DIMER, QUANTITATIVE    Imaging Review Dg Chest 2 View  06/06/2013   CLINICAL DATA:  Fever, shortness of breath, cough  EXAM: CHEST  2 VIEW  COMPARISON:  None.  FINDINGS: Lungs are clear.  No pleural effusion or pneumothorax.  The heart is normal in size.  Visualized osseous structures are within normal limits.  IMPRESSION: No evidence of acute cardiopulmonary disease.   Electronically Signed   By: Julian Hy M.D.   On: 06/06/2013 13:38      EKG Interpretation None      MDM   Final diagnoses:  Bronchitis   Labs and imaging reviewed, VSS  Patient is feeling better. Lung sounds greatly improved after nebs and prednisone.  Vital signs are stable. No hypoxia or tachypnea. D-dimer is reassuring for no PE. Patient agrees to continue albuterol inhaler, prednisone taper and Tessalon Perles.  Patient is well appearing, non-toxic and stable for d/c.  She agrees to close f/u with her PMD or to return here if her sx's worsen  Jaklyn Alen L. Vanessa Honalo, PA-C 06/07/13 2124

## 2013-06-08 NOTE — ED Provider Notes (Signed)
Medical screening examination/treatment/procedure(s) were performed by non-physician practitioner and as supervising physician I was immediately available for consultation/collaboration.   EKG Interpretation None        Mervin Kung, MD 06/08/13 [phone]

## 2014-06-04 ENCOUNTER — Ambulatory Visit (INDEPENDENT_AMBULATORY_CARE_PROVIDER_SITE_OTHER): Payer: Medicaid Other | Admitting: Women's Health

## 2014-06-04 ENCOUNTER — Encounter: Payer: Self-pay | Admitting: Women's Health

## 2014-06-04 VITALS — BP 136/82 | HR 72 | Wt 206.0 lb

## 2014-06-04 DIAGNOSIS — A599 Trichomoniasis, unspecified: Secondary | ICD-10-CM | POA: Diagnosis not present

## 2014-06-04 DIAGNOSIS — N898 Other specified noninflammatory disorders of vagina: Secondary | ICD-10-CM | POA: Diagnosis not present

## 2014-06-04 LAB — POCT WET PREP (WET MOUNT): CLUE CELLS WET PREP WHIFF POC: NEGATIVE

## 2014-06-04 MED ORDER — METRONIDAZOLE 500 MG PO TABS: 2000.0000 mg | ORAL_TABLET | Freq: Once | ORAL | Status: DC

## 2014-06-04 NOTE — Progress Notes (Signed)
Patient ID: [name], female   DOB: 11-10-1980, 34 y.o.   MRN: [id number]   [facility] Visit  Patient name: NYISHA CLIPPARD MRN [id number]  Date of birth: 02/21/1980  CC & HPI:  KENSY BLIZARD is a 34 y.o. Caucasian female presenting today for report of vaginal itching and white d/c that started 4/16- she thought was yeast so did otc monistat which helped for about 1week, then symptoms returned. Now itching is on top part of vulva and d/c is clear and malodorous. Had unprotected sex w/ new partner on 4/9. Has appt next week for IUD removal & reinsertion. Unsure of last pap.  Pertinent History Reviewed:  Medical & Surgical Hx:   Past Medical History  Diagnosis Date  . Hypertension   . Kyphosis   . Scheuermann's disease    Past Surgical History  Procedure Laterality Date  . Cesarean section    . Tonsillectomy     Medications: Reviewed & Updated - see associated section Social History: Reviewed -  reports that she has been smoking Cigarettes.  She has been smoking about 1.00 pack per day. She does not have any smokeless tobacco history on file.  Objective Findings:  Vitals: BP 136/82 mmHg  Pulse 72  Wt 206 lb (93.441 kg)  Physical Examination: General appearance - alert, well appearing, and in no distress Pelvic - normal external genitalia, mirena strings visible, mod amount thin frothy white malodorous d/c  Results for orders placed or performed in visit on 06/04/14 (from the past 24 hour(s))  POCT Wet Prep [facility] [place])   Collection Time: 06/04/14 10:09 AM  Result Value Ref Range   Source Wet Prep POC vaginal    WBC, Wet Prep HPF POC none    Bacteria Wet Prep HPF POC none    BACTERIA WET PREP MORPHOLOGY POC     Clue Cells Wet Prep HPF POC None    Clue Cells Wet Prep Whiff POC Negative Whiff    Yeast Wet Prep HPF POC None    KOH Wet Prep POC     Trichomonas Wet Prep HPF POC many      Assessment & Plan:  A:   Trichomonas  Needs pap P:  Rx flagyl 2gm  po x1, no etoh- to let me know if partner wants me to tx, no sex until POC  F/U next week as scheduled for trich poc & mirena removal/reinsert. Understands if trich still present or gc/ct + will have to wait until infections clears before removal/reinsert of IUD  Needs pap scheduled    Tawnya Crook CNM, [facility] 06/04/2014 10:10 AM

## 2014-06-04 NOTE — Patient Instructions (Signed)

## 2014-06-05 LAB — GC/CHLAMYDIA PROBE AMP
CHLAMYDIA, DNA PROBE: NEGATIVE
NEISSERIA GONORRHOEAE BY PCR: NEGATIVE

## 2014-06-12 ENCOUNTER — Ambulatory Visit: Payer: Medicaid Other | Admitting: Advanced Practice Midwife

## 2014-06-14 ENCOUNTER — Encounter: Payer: Self-pay | Admitting: Advanced Practice Midwife

## 2014-06-14 ENCOUNTER — Ambulatory Visit (INDEPENDENT_AMBULATORY_CARE_PROVIDER_SITE_OTHER): Payer: Medicaid Other | Admitting: Advanced Practice Midwife

## 2014-06-14 VITALS — BP 138/76 | HR 78 | Ht 61.0 in | Wt 210.0 lb

## 2014-06-14 DIAGNOSIS — A599 Trichomoniasis, unspecified: Secondary | ICD-10-CM

## 2014-06-14 DIAGNOSIS — N898 Other specified noninflammatory disorders of vagina: Secondary | ICD-10-CM

## 2014-06-14 DIAGNOSIS — Z3202 Encounter for pregnancy test, result negative: Secondary | ICD-10-CM

## 2014-06-14 DIAGNOSIS — Z30433 Encounter for removal and reinsertion of intrauterine contraceptive device: Secondary | ICD-10-CM | POA: Insufficient documentation

## 2014-06-14 NOTE — Progress Notes (Signed)
Angelica Lopez is a 34 y.o. year old Caucasian female   who presents for removal and replacement of a Mirena IUD. Her pregnancy test today is negative.  She was treated for trich 3 weeks ago (one night stand), feels better and wet prep today is neg with normal appearing dc  The risks and benefits of the method and placement have been thouroughly reviewed with the patient and all questions were answered.  Specifically the patient is aware of failure rate of 02/998, expulsion of the IUD and of possible perforation.  The patient is aware of irregular bleeding due to the method and understands the incidence of irregular bleeding diminishes with time. She is also aware that as of today, the [name] is approved for 3 years, with the expectation (although not guarantee) that it will receive 5 year, and most likely 7 year, approval).  Time out was performed.  A Graves speculum was placed.  The cervix was prepped using Betadine. The uterus was found to be neutral and tilted towards the leftnand it sounded to 8 cm.  The cervix was grasped with a tenaculum and the IUD was inserted to 8 cm.  It was pulled back 1 cm and the IUD was disengaged.  The strings were trimmed to 3 cm.  Sonogram was performed and the proper placement of the IUD was verified.  The patient was instructed on signs and symptoms of infection and to check for the strings after each menses or each month.  The patient is to refrain from intercourse for 3 days.  The patient is scheduled for a return appointment after her first menses or 4 weeks. She will get a pap and physcial at this appt  CRESENZO-DISHMAN,Andros Channing 06/14/2014 1:00 PM

## 2014-06-18 LAB — POCT URINE PREGNANCY: Preg Test, Ur: NEGATIVE

## 2014-07-12 ENCOUNTER — Encounter: Payer: Self-pay | Admitting: Advanced Practice Midwife

## 2014-07-12 ENCOUNTER — Other Ambulatory Visit: Payer: Medicaid Other | Admitting: Advanced Practice Midwife

## 2014-07-25 ENCOUNTER — Other Ambulatory Visit: Payer: Medicaid Other | Admitting: Advanced Practice Midwife

## 2014-07-31 ENCOUNTER — Other Ambulatory Visit: Payer: Medicaid Other | Admitting: Obstetrics & Gynecology

## 2014-08-10 ENCOUNTER — Encounter: Payer: Self-pay | Admitting: Obstetrics & Gynecology

## 2014-08-10 ENCOUNTER — Ambulatory Visit (INDEPENDENT_AMBULATORY_CARE_PROVIDER_SITE_OTHER): Payer: Medicaid Other | Admitting: Obstetrics & Gynecology

## 2014-08-10 ENCOUNTER — Other Ambulatory Visit (HOSPITAL_COMMUNITY)
Admission: RE | Admit: 2014-08-10 | Discharge: 2014-08-10 | Disposition: A | Discharge status: Home / Self Care | Payer: Medicaid Other | Source: Ambulatory Visit | Attending: Obstetrics & Gynecology | Admitting: Obstetrics & Gynecology

## 2014-08-10 VITALS — BP 120/76 | HR 76 | Ht 62.0 in | Wt 209.0 lb

## 2014-08-10 DIAGNOSIS — Z01419 Encounter for gynecological examination (general) (routine) without abnormal findings: Secondary | ICD-10-CM | POA: Diagnosis not present

## 2014-08-10 DIAGNOSIS — Z01411 Encounter for gynecological examination (general) (routine) with abnormal findings: Secondary | ICD-10-CM | POA: Diagnosis present

## 2014-08-10 DIAGNOSIS — Z1151 Encounter for screening for human papillomavirus (HPV): Secondary | ICD-10-CM | POA: Diagnosis present

## 2014-08-10 DIAGNOSIS — R87810 Cervical high risk human papillomavirus (HPV) DNA test positive: Secondary | ICD-10-CM | POA: Diagnosis present

## 2014-08-10 DIAGNOSIS — Z0000 Encounter for general adult medical examination without abnormal findings: Secondary | ICD-10-CM

## 2014-08-10 MED ORDER — AMOXICILLIN 500 MG PO CAPS: 500.0000 mg | ORAL_CAPSULE | Freq: Three times a day (TID) | ORAL | Status: DC

## 2014-08-10 NOTE — Progress Notes (Signed)
Patient ID: Angelica Lopez, female   DOB: 21-Jan-1981, 34 y.o.   MRN: [id number] Subjective:     Angelica Lopez is a 34 y.o. female here for a routine exam.  No LMP recorded. Patient is not currently having periods (Reason: IUD). No obstetric history on file. Birth Control Method:  mirena Menstrual Calendar(currently): none on mirena  Current complaints: none.   Current acute medical issues:  none   Recent Gynecologic History No LMP recorded. Patient is not currently having periods (Reason: IUD). Last Pap: 2011,  normal Last mammogram: ,    Past Medical History  Diagnosis Date  . Hypertension   . Kyphosis   . Scheuermann's disease     Past Surgical History  Procedure Laterality Date  . Cesarean section    . Tonsillectomy      OB History    No data available      History   Social History  . Marital Status: Legally Separated    Spouse Name: N/A  . Number of Children: N/A  . Years of Education: N/A   Social History Main Topics  . Smoking status: Current Every Day Smoker -- 1.00 packs/day for 15 years    Types: Cigarettes  . Smokeless tobacco: Never Used  . Alcohol Use: No  . Drug Use: Yes    Special: Marijuana     Comment: twice a week  . Sexual Activity: Not Currently    Birth Control/ Protection: IUD   Other Topics Concern  . None   Social History Narrative    Family History  Problem Relation Age of Onset  . Hypertension Father   . Diabetes Father   . Heart disease Maternal Grandfather     has had 3 open heart surgeries  . Febrile seizures Daughter   . Febrile seizures Daughter   . Febrile seizures Daughter      Current outpatient prescriptions:  .  Buprenorphine HCl-Naloxone HCl (SUBOXONE) 8-2 MG FILM, Place under the tongue daily., Disp: , Rfl:  .  cetirizine (ZYRTEC) 10 MG tablet, Take 10 mg by mouth daily., Disp: , Rfl:  .  cholecalciferol (VITAMIN D) 1000 UNITS tablet, Take 1,000 Units by mouth daily. Take 2 tabs daily, Disp: , Rfl:  .   ibuprofen (ADVIL,MOTRIN) 200 MG tablet, Take 400 mg by mouth every 6 (six) hours as needed for headache or mild pain. , Disp: , Rfl:  .  levonorgestrel (MIRENA) 20 MCG/24HR IUD, 1 each by Intrauterine route once., Disp: , Rfl:  .  lisinopril (PRINIVIL,ZESTRIL) 10 MG tablet, Take 10 mg by mouth daily., Disp: , Rfl:  .  PARoxetine (PAXIL) 20 MG tablet, Take 20 mg by mouth daily., Disp: , Rfl:  .  amoxicillin (AMOXIL) 500 MG capsule, Take 1 capsule (500 mg total) by mouth 3 (three) times daily., Disp: 30 capsule, Rfl: 0  Review of Systems  Review of Systems  Constitutional: Negative for fever, chills, weight loss, malaise/fatigue and diaphoresis.  HENT: Negative for hearing loss, ear pain, nosebleeds, congestion, sore throat, neck pain, tinnitus and ear discharge.   Eyes: Negative for blurred vision, double vision, photophobia, pain, discharge and redness.  Respiratory: Negative for cough, hemoptysis, sputum production, shortness of breath, wheezing and stridor.   Cardiovascular: Negative for chest pain, palpitations, orthopnea, claudication, leg swelling and PND.  Gastrointestinal: negative for abdominal pain. Negative for heartburn, nausea, vomiting, diarrhea, constipation, blood in stool and melena.  Genitourinary: Negative for dysuria, urgency, frequency, hematuria and flank pain.  Musculoskeletal: Negative  for myalgias, back pain, joint pain and falls.  Skin: Negative for itching and rash.  Neurological: Negative for dizziness, tingling, tremors, sensory change, speech change, focal weakness, seizures, loss of consciousness, weakness and headaches.  Endo/Heme/Allergies: Negative for environmental allergies and polydipsia. Does not bruise/bleed easily.  Psychiatric/Behavioral: Negative for depression, suicidal ideas, hallucinations, memory loss and substance abuse. The patient is not nervous/anxious and does not have insomnia.        Objective:  Blood pressure 120/76, pulse 76, height 5\' 2"   (1.575 m), weight 209 lb (94.802 kg).   Physical Exam  Vitals reviewed. Constitutional: She is oriented to person, place, and time. She appears well-developed and well-nourished.  HENT:  Head: Normocephalic and atraumatic.        Right Ear: External ear normal.  Left Ear: External ear normal.  Nose: Nose normal.  Mouth/Throat: Oropharynx is clear and moist.  Eyes: Conjunctivae and EOM are normal. Pupils are equal, round, and reactive to light. Right eye exhibits no discharge. Left eye exhibits no discharge. No scleral icterus.  Neck: Normal range of motion. Neck supple. No tracheal deviation present. No thyromegaly present.  Cardiovascular: Normal rate, regular rhythm, normal heart sounds and intact distal pulses.  Exam reveals no gallop and no friction rub.   No murmur heard. Respiratory: Effort normal and breath sounds normal. No respiratory distress. She has no wheezes. She has no rales. She exhibits no tenderness.  GI: Soft. Bowel sounds are normal. She exhibits no distension and no mass. There is no tenderness. There is no rebound and no guarding.  Genitourinary:  Breasts no masses skin changes or nipple changes bilaterally      Vulva is normal without lesions Vagina is pink moist without discharge Cervix normal in appearance and pap is done< IUD strings are present and appropriate Uterus is normal size shape and contour Adnexa is negative with normal sized ovaries   Musculoskeletal: Normal range of motion. She exhibits no edema and no tenderness.  Neurological: She is alert and oriented to person, place, and time. She has normal reflexes. She displays normal reflexes. No cranial nerve deficit. She exhibits normal muscle tone. Coordination normal.  Skin: Skin is warm and dry. No rash noted. No erythema. No pallor.  Psychiatric: She has a normal mood and affect. Her behavior is normal. Judgment and thought content normal.       Assessment:    Healthy female exam.    Plan:     Contraception: IUD. Follow up in: 1 year.

## 2014-08-15 LAB — CYTOLOGY - PAP

## 2014-08-21 ENCOUNTER — Telehealth: Payer: Self-pay | Admitting: *Deleted

## 2014-08-21 NOTE — Telephone Encounter (Signed)
-----   Message from Florian Buff, MD sent at 08/19/2014  8:52 AM EDT ----- Please schedule for colposcopy due to ASCUS +HPV  Thanks  Dr Elonda Husky ----- Message -----    From: [facility]    Sent: 08/17/2014   2:20 PM      To: Florian Buff, MD

## 2014-08-21 NOTE — Telephone Encounter (Signed)
Pt informed of abnormal pap (ASCUS with HPV) results from 08/10/2014, colposcopy scheduled  with Dr. Elonda Husky. Colposcopy procedure discussed and all questions answered.

## 2014-08-28 ENCOUNTER — Encounter: Payer: Self-pay | Admitting: Obstetrics & Gynecology

## 2014-08-28 ENCOUNTER — Ambulatory Visit (INDEPENDENT_AMBULATORY_CARE_PROVIDER_SITE_OTHER): Payer: Medicaid Other | Admitting: Obstetrics & Gynecology

## 2014-08-28 VITALS — BP 110/60 | HR 72 | Wt 213.0 lb

## 2014-08-28 DIAGNOSIS — R896 Abnormal cytological findings in specimens from other organs, systems and tissues: Secondary | ICD-10-CM

## 2014-08-28 DIAGNOSIS — IMO0002 Reserved for concepts with insufficient information to code with codable children: Secondary | ICD-10-CM

## 2014-08-28 NOTE — Progress Notes (Signed)
Patient ID: [name], female   DOB: 1980/07/22, 34 y.o.   MRN: [id number] Colposcopy Procedure Note:  Colposcopy Procedure Note  Indications: Pap smear 1 months ago showed: ASCUS with POSITIVE high risk HPV. The prior pap showed no abnormalities.  Prior cervical/vaginal disease: . Prior cervical treatment: .  Smoker:  Yes.   New sexual partner:  No.  : time frame:    History of abnormal Pap: yes  Procedure Details  The risks and benefits of the procedure and Written informed consent obtained.  Speculum placed in vagina and excellent visualization of cervix achieved, cervix swabbed x 3 with acetic acid solution.  Findings: Cervix: no visible lesions, no mosaicism, no punctation and no abnormal vasculature; no biopsies taken. HPV atypia changes Vaginal inspection: normal without visible lesions. Vulvar colposcopy: vulvar colposcopy not performed.  Specimens: none  Complications: none.  Plan: Follow up Pap in 1 year

## 2015-08-26 ENCOUNTER — Other Ambulatory Visit: Payer: Medicaid Other | Admitting: Obstetrics & Gynecology

## 2015-08-26 ENCOUNTER — Encounter: Payer: Self-pay | Admitting: Obstetrics & Gynecology

## 2015-09-20 ENCOUNTER — Other Ambulatory Visit: Payer: Medicaid Other | Admitting: Obstetrics & Gynecology

## 2015-09-20 ENCOUNTER — Encounter: Payer: Self-pay | Admitting: Obstetrics & Gynecology

## 2015-10-07 ENCOUNTER — Ambulatory Visit (INDEPENDENT_AMBULATORY_CARE_PROVIDER_SITE_OTHER): Payer: Medicaid Other | Admitting: Obstetrics & Gynecology

## 2015-10-07 ENCOUNTER — Other Ambulatory Visit (HOSPITAL_COMMUNITY)
Admission: RE | Admit: 2015-10-07 | Discharge: 2015-10-07 | Disposition: A | Discharge status: Home / Self Care | Payer: Medicaid Other | Source: Ambulatory Visit | Attending: Obstetrics & Gynecology | Admitting: Obstetrics & Gynecology

## 2015-10-07 ENCOUNTER — Encounter: Payer: Self-pay | Admitting: Obstetrics & Gynecology

## 2015-10-07 VITALS — BP 100/60 | HR 74 | Ht 61.0 in | Wt 201.0 lb

## 2015-10-07 DIAGNOSIS — Z01411 Encounter for gynecological examination (general) (routine) with abnormal findings: Secondary | ICD-10-CM | POA: Diagnosis present

## 2015-10-07 DIAGNOSIS — Z0000 Encounter for general adult medical examination without abnormal findings: Secondary | ICD-10-CM | POA: Diagnosis not present

## 2015-10-07 DIAGNOSIS — Z01419 Encounter for gynecological examination (general) (routine) without abnormal findings: Secondary | ICD-10-CM | POA: Diagnosis not present

## 2015-10-07 MED ORDER — POLYETHYLENE GLYCOL 3350 17 GM/SCOOP PO POWD: ORAL | 11 refills | Status: DC

## 2015-10-07 NOTE — Progress Notes (Signed)
Subjective:     Angelica Lopez is a 35 y.o. female here for a routine exam.  No LMP recorded. Patient is not currently having periods (Reason: IUD). No obstetric history on file. Birth Control Method:  IUD Menstrual Calendar(currently): minimal irregula  Current complaints: none.   Current acute medical issues:  none   Recent Gynecologic History No LMP recorded. Patient is not currently having periods (Reason: IUD). Last Pap: 2016,  normal Last mammogram: ,    Past Medical History:  Diagnosis Date  . Hypertension   . Kyphosis   . Scheuermann's disease     Past Surgical History:  Procedure Laterality Date  . CESAREAN SECTION    . TONSILLECTOMY      OB History    No data available      Social History   Social History  . Marital status: Legally Separated    Spouse name: N/A  . Number of children: N/A  . Years of education: N/A   Social History Main Topics  . Smoking status: Current Every Day Smoker    Packs/day: 1.00    Years: 15.00    Types: Cigarettes  . Smokeless tobacco: Never Used  . Alcohol use No  . Drug use:     Types: Marijuana     Comment: twice a week  . Sexual activity: Not Currently    Birth control/ protection: IUD   Other Topics Concern  . None   Social History Narrative  . None    Family History  Problem Relation Age of Onset  . Hypertension Father   . Diabetes Father   . Heart disease Maternal Grandfather     has had 3 open heart surgeries  . Febrile seizures Daughter   . Febrile seizures Daughter   . Febrile seizures Daughter      Current Outpatient Prescriptions:  .  Buprenorphine HCl-Naloxone HCl (SUBOXONE) 8-2 MG FILM, Place under the tongue daily., Disp: , Rfl:  .  cetirizine (ZYRTEC) 10 MG tablet, Take 10 mg by mouth daily., Disp: , Rfl:  .  cholecalciferol (VITAMIN D) 1000 UNITS tablet, Take 1,000 Units by mouth daily. Take 2 tabs daily, Disp: , Rfl:  .  ibuprofen (ADVIL,MOTRIN) 200 MG tablet, Take 400 mg by mouth every  6 (six) hours as needed for headache or mild pain. , Disp: , Rfl:  .  levonorgestrel (MIRENA) 20 MCG/24HR IUD, 1 each by Intrauterine route once., Disp: , Rfl:  .  lisinopril (PRINIVIL,ZESTRIL) 10 MG tablet, Take 10 mg by mouth daily., Disp: , Rfl:  .  PARoxetine (PAXIL) 20 MG tablet, Take 20 mg by mouth daily., Disp: , Rfl:  .  polyethylene glycol powder (GLYCOLAX/MIRALAX) powder, 1 scoop daily or as needed, Disp: 255 g, Rfl: 11  Review of Systems  Review of Systems  Constitutional: Negative for fever, chills, weight loss, malaise/fatigue and diaphoresis.  HENT: Negative for hearing loss, ear pain, nosebleeds, congestion, sore throat, neck pain, tinnitus and ear discharge.   Eyes: Negative for blurred vision, double vision, photophobia, pain, discharge and redness.  Respiratory: Negative for cough, hemoptysis, sputum production, shortness of breath, wheezing and stridor.   Cardiovascular: Negative for chest pain, palpitations, orthopnea, claudication, leg swelling and PND.  Gastrointestinal: negative for abdominal pain. Negative for heartburn, nausea, vomiting, diarrhea, constipation, blood in stool and melena.  Genitourinary: Negative for dysuria, urgency, frequency, hematuria and flank pain.  Musculoskeletal: Negative for myalgias, back pain, joint pain and falls.  Skin: Negative for itching and rash.  Neurological: Negative for dizziness, tingling, tremors, sensory change, speech change, focal weakness, seizures, loss of consciousness, weakness and headaches.  Endo/Heme/Allergies: Negative for environmental allergies and polydipsia. Does not bruise/bleed easily.  Psychiatric/Behavioral: Negative for depression, suicidal ideas, hallucinations, memory loss and substance abuse. The patient is not nervous/anxious and does not have insomnia.        Objective:  Blood pressure 100/60, pulse 74, height 5\' 1"  (1.549 m), weight 201 lb (91.2 kg).   Physical Exam  Vitals reviewed. Constitutional:  She is oriented to person, place, and time. She appears well-developed and well-nourished.  HENT:  Head: Normocephalic and atraumatic.        Right Ear: External ear normal.  Left Ear: External ear normal.  Nose: Nose normal.  Mouth/Throat: Oropharynx is clear and moist.  Eyes: Conjunctivae and EOM are normal. Pupils are equal, round, and reactive to light. Right eye exhibits no discharge. Left eye exhibits no discharge. No scleral icterus.  Neck: Normal range of motion. Neck supple. No tracheal deviation present. No thyromegaly present.  Cardiovascular: Normal rate, regular rhythm, normal heart sounds and intact distal pulses.  Exam reveals no gallop and no friction rub.   No murmur heard. Respiratory: Effort normal and breath sounds normal. No respiratory distress. She has no wheezes. She has no rales. She exhibits no tenderness.  GI: Soft. Bowel sounds are normal. She exhibits no distension and no mass. There is no tenderness. There is no rebound and no guarding.  Genitourinary:  Breasts no masses skin changes or nipple changes bilaterally      Vulva is normal without lesions Vagina is pink moist without discharge Cervix normal in appearance and pap is done Uterus is normal size shape and contour Adnexa is negative with normal sized ovaries   Musculoskeletal: Normal range of motion. She exhibits no edema and no tenderness.  Neurological: She is alert and oriented to person, place, and time. She has normal reflexes. She displays normal reflexes. No cranial nerve deficit. She exhibits normal muscle tone. Coordination normal.  Skin: Skin is warm and dry. No rash noted. No erythema. No pallor.  Psychiatric: She has a normal mood and affect. Her behavior is normal. Judgment and thought content normal.       Medications Ordered at today's visit: Meds ordered this encounter  Medications  . polyethylene glycol powder (GLYCOLAX/MIRALAX) powder    Sig: 1 scoop daily or as needed     Dispense:  255 g    Refill:  11    Other orders placed at today's visit: No orders of the defined types were placed in this encounter.     Assessment:    Healthy female exam.    Plan:    Contraception: IUD.     Return in about 1 year (around 10/06/2016) for yearly, with Dr Elonda Husky.

## 2015-10-08 LAB — CYTOLOGY - PAP

## 2015-10-10 ENCOUNTER — Telehealth: Payer: Self-pay | Admitting: *Deleted

## 2015-10-15 ENCOUNTER — Telehealth: Payer: Self-pay | Admitting: *Deleted

## 2015-10-15 NOTE — Telephone Encounter (Signed)
Pt informed of abnormal pap (LSIL) from 10/07/2015. Colposcopy scheduled for 10/29/2015 with Dr.Eure. All questions answered and procedure discussed, pt verbalized understanding.

## 2015-10-16 NOTE — Telephone Encounter (Signed)
Duplicate message. Pt notified of abnormal pap and colposcopy scheduled.

## 2015-10-29 ENCOUNTER — Encounter: Payer: Self-pay | Admitting: Obstetrics & Gynecology

## 2015-11-14 ENCOUNTER — Encounter: Payer: Self-pay | Admitting: Obstetrics & Gynecology

## 2015-11-29 ENCOUNTER — Encounter: Payer: Self-pay | Admitting: Obstetrics & Gynecology

## 2015-12-16 ENCOUNTER — Encounter: Payer: Self-pay | Admitting: Obstetrics & Gynecology

## 2015-12-24 ENCOUNTER — Encounter: Payer: Self-pay | Admitting: Obstetrics & Gynecology

## 2016-01-21 ENCOUNTER — Ambulatory Visit (INDEPENDENT_AMBULATORY_CARE_PROVIDER_SITE_OTHER): Payer: Medicaid Other | Admitting: Obstetrics & Gynecology

## 2016-01-21 ENCOUNTER — Encounter: Payer: Self-pay | Admitting: Obstetrics & Gynecology

## 2016-01-21 VITALS — BP 90/60 | HR 84 | Ht 61.0 in | Wt 198.4 lb

## 2016-01-21 DIAGNOSIS — R87612 Low grade squamous intraepithelial lesion on cytologic smear of cervix (LGSIL): Secondary | ICD-10-CM | POA: Diagnosis not present

## 2016-01-21 DIAGNOSIS — N870 Mild cervical dysplasia: Secondary | ICD-10-CM

## 2016-01-21 DIAGNOSIS — R87610 Atypical squamous cells of undetermined significance on cytologic smear of cervix (ASC-US): Secondary | ICD-10-CM

## 2016-01-21 NOTE — Progress Notes (Signed)
Colposcopy Procedure Note:  Colposcopy Procedure Note  Indications: Pap smear 3 months ago showed: low-grade squamous intraepithelial neoplasia (LGSIL - encompassing HPV,mild dysplasia,CIN I). The prior pap showed ASCUS with POSITIVE high risk HPV.  Prior cervical/vaginal disease: normal exam without visible pathology. Prior cervical treatment: no treatment.  Smoker:  Yes.   New sexual partner:  No.  : time frame:    History of abnormal Pap: yes  Procedure Details  The risks and benefits of the procedure and Written informed consent obtained.  Speculum placed in vagina and excellent visualization of cervix achieved, cervix swabbed x 3 with acetic acid solution.  Findings: Cervix: no mosaicism, no punctation, no abnormal vasculature and acetowhite lesion(s) noted at 6-9 o'clock; no biopsies taken. Vaginal inspection: normal without visible lesions. Vulvar colposcopy: vulvar colposcopy not performed.  Specimens: none  Complications: none.  Plan: Follow up cytology with HPV serotyping

## 2016-03-09 ENCOUNTER — Ambulatory Visit (INDEPENDENT_AMBULATORY_CARE_PROVIDER_SITE_OTHER): Payer: Medicaid Other | Admitting: Podiatry

## 2016-03-09 ENCOUNTER — Encounter: Payer: Self-pay | Admitting: Podiatry

## 2016-03-09 VITALS — BP 112/80 | HR 88

## 2016-03-09 DIAGNOSIS — B070 Plantar wart: Secondary | ICD-10-CM

## 2016-03-09 DIAGNOSIS — D492 Neoplasm of unspecified behavior of bone, soft tissue, and skin: Secondary | ICD-10-CM

## 2016-03-09 DIAGNOSIS — M79672 Pain in left foot: Secondary | ICD-10-CM

## 2016-03-09 NOTE — Progress Notes (Signed)
   Subjective:    Patient ID: Angelica Lopez, female    DOB: August 08, 1980, 36 y.o.   MRN: [id number]  HPI    Review of Systems  All other systems reviewed and are negative.      Objective:   Physical Exam        Assessment & Plan:

## 2016-03-09 NOTE — Progress Notes (Signed)
Patient ID: Angelica Lopez, female   DOB: 1980-06-13, 36 y.o.   MRN: [id number]    Subjective: Patient presents today with pain and tenderness on the plantar aspect of the left foot secondary to a plantars wart. Patient states that the pain has been present for several weeks now. Patient denies trauma. [place]  Objective: Physical Exam General: The patient is alert and oriented x3 in no acute distress.  Dermatology: Hyperkeratotic skin lesion noted to the plantar aspect of the left foot approximately 1 cm in diameter. Pinpoint bleeding noted upon debridement. Skin is warm, dry and supple bilateral lower extremities. Negative for open lesions or macerations.  Vascular: Palpable pedal pulses bilaterally. No edema or erythema noted. Capillary refill within normal limits.  Neurological: Epicritic and protective threshold grossly intact bilaterally.   Musculoskeletal Exam: Pain on palpation to the note skin lesion.  Range of motion within normal limits to all pedal and ankle joints bilateral. Muscle strength 5/5 in all groups bilateral.   Assessment: #1 plantar wart left foot #2 pain in left foot   Plan of Care:  #1 Patient was evaluated. #2 local anesthesia infiltration using 2% lidocaine with epinephrine was utilized totaling 3 mL. Excisional debridement of the plantar wart lesion was performed using a chisel blade. Cantharone was applied and the lesion was dressed with a dry sterile dressing. #3 patient is to return to clinic in 1 week.  Next visit 17110  Edrick Kins, DPM [facility]  Dr. Edrick Kins, [address]                                        [place], [place] [address]                Office [phone]  Fax [phone]

## 2016-03-16 ENCOUNTER — Ambulatory Visit: Payer: Medicaid Other | Admitting: Podiatry

## 2016-03-23 ENCOUNTER — Ambulatory Visit: Payer: Medicaid Other | Admitting: Podiatry

## 2016-10-08 ENCOUNTER — Other Ambulatory Visit: Payer: Medicaid Other | Admitting: Obstetrics & Gynecology

## 2016-11-03 ENCOUNTER — Other Ambulatory Visit: Payer: Medicaid Other | Admitting: Obstetrics & Gynecology

## 2017-01-12 ENCOUNTER — Other Ambulatory Visit: Payer: Medicaid Other | Admitting: Obstetrics & Gynecology

## 2017-02-13 ENCOUNTER — Encounter (HOSPITAL_COMMUNITY): Payer: Self-pay

## 2017-02-13 ENCOUNTER — Emergency Department (HOSPITAL_COMMUNITY)
Admission: EM | Admit: 2017-02-13 | Discharge: 2017-02-13 | Disposition: A | Discharge status: Home / Self Care | Payer: Medicaid Other | Attending: Emergency Medicine | Admitting: Emergency Medicine

## 2017-02-13 DIAGNOSIS — F17210 Nicotine dependence, cigarettes, uncomplicated: Secondary | ICD-10-CM | POA: Diagnosis not present

## 2017-02-13 DIAGNOSIS — H1033 Unspecified acute conjunctivitis, bilateral: Secondary | ICD-10-CM

## 2017-02-13 DIAGNOSIS — I10 Essential (primary) hypertension: Secondary | ICD-10-CM | POA: Insufficient documentation

## 2017-02-13 DIAGNOSIS — R21 Rash and other nonspecific skin eruption: Secondary | ICD-10-CM | POA: Diagnosis present

## 2017-02-13 DIAGNOSIS — Z79899 Other long term (current) drug therapy: Secondary | ICD-10-CM | POA: Insufficient documentation

## 2017-02-13 MED ORDER — TOBRAMYCIN 0.3 % OP SOLN
2.0000 [drp] | Freq: Once | OPHTHALMIC | Status: AC
  Administered 2017-02-13: 2 [drp] via OPHTHALMIC
  Filled 2017-02-13: qty 5

## 2017-02-13 MED ORDER — LORATADINE 10 MG PO TABS
10.0000 mg | ORAL_TABLET | Freq: Once | ORAL | Status: DC
  Filled 2017-02-13: qty 1
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
## 2017-02-13 NOTE — ED Triage Notes (Signed)
Pt reports raised rash around eyes x 2 days.  Reports it is draining and itching.  Denies using any new products.

## 2017-02-13 NOTE — ED Notes (Signed)
Right eye - 20/20 Left eye - 20/25

## 2017-02-13 NOTE — Discharge Instructions (Signed)
Please apply cool compresses to your eyes 3 times a day.  This is highly contagious.  Please wash hands frequently, and please wipe down surfaces frequently.  Please use 2 drops of tobramycin in each eye every 4 hours over the next 5 days.  Please see your primary physician or return to the emergency department if not improving.  Using dark glasses and a hat with a brim may be helpful.  Avoiding areas where you have to change from bright to dark or dark to bright areas may also be helpful.

## 2017-11-03 DIAGNOSIS — I10 Essential (primary) hypertension: Secondary | ICD-10-CM | POA: Insufficient documentation

## 2017-11-03 DIAGNOSIS — G8929 Other chronic pain: Secondary | ICD-10-CM | POA: Insufficient documentation

## 2017-11-03 DIAGNOSIS — F1120 Opioid dependence, uncomplicated: Secondary | ICD-10-CM | POA: Insufficient documentation

## 2017-11-03 DIAGNOSIS — F411 Generalized anxiety disorder: Secondary | ICD-10-CM | POA: Insufficient documentation

## 2017-11-03 DIAGNOSIS — F1190 Opioid use, unspecified, uncomplicated: Secondary | ICD-10-CM | POA: Insufficient documentation

## 2017-11-09 ENCOUNTER — Ambulatory Visit (HOSPITAL_COMMUNITY): Payer: Medicaid Other

## 2017-11-09 ENCOUNTER — Encounter (HOSPITAL_COMMUNITY): Payer: Self-pay

## 2017-11-16 ENCOUNTER — Ambulatory Visit (HOSPITAL_COMMUNITY): Payer: Medicaid Other

## 2017-11-16 ENCOUNTER — Encounter (HOSPITAL_COMMUNITY): Payer: Self-pay

## 2017-12-02 DIAGNOSIS — F17210 Nicotine dependence, cigarettes, uncomplicated: Secondary | ICD-10-CM | POA: Insufficient documentation
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
## 2017-12-06 ENCOUNTER — Encounter (INDEPENDENT_AMBULATORY_CARE_PROVIDER_SITE_OTHER): Payer: Self-pay

## 2017-12-06 ENCOUNTER — Ambulatory Visit (INDEPENDENT_AMBULATORY_CARE_PROVIDER_SITE_OTHER): Payer: Medicaid Other | Admitting: Psychiatry

## 2017-12-06 ENCOUNTER — Encounter (HOSPITAL_COMMUNITY): Payer: Self-pay | Admitting: Psychiatry

## 2017-12-06 VITALS — BP 156/90 | HR 80 | Ht 61.0 in | Wt 200.0 lb

## 2017-12-06 DIAGNOSIS — G4700 Insomnia, unspecified: Secondary | ICD-10-CM | POA: Diagnosis not present

## 2017-12-06 DIAGNOSIS — F1121 Opioid dependence, in remission: Secondary | ICD-10-CM

## 2017-12-06 DIAGNOSIS — F411 Generalized anxiety disorder: Secondary | ICD-10-CM

## 2017-12-06 DIAGNOSIS — F331 Major depressive disorder, recurrent, moderate: Secondary | ICD-10-CM | POA: Diagnosis not present

## 2017-12-06 LAB — TSH: TSH: 1.81 mIU/L

## 2017-12-06 MED ORDER — QUETIAPINE FUMARATE 25 MG PO TABS: 25.0000 mg | ORAL_TABLET | Freq: Every day | ORAL | 0 refills | Status: DC

## 2017-12-06 NOTE — Patient Instructions (Addendum)
1. Continue Paxil 40 mg daily  2. Start quetiapine 25 mg at night 3. Return to clinic in one month for 30 mins 4. Check TSH 5. Referral to therapy outside of [facility]

## 2017-12-07 ENCOUNTER — Telehealth (HOSPITAL_COMMUNITY): Payer: Self-pay | Admitting: *Deleted

## 2017-12-07 NOTE — Telephone Encounter (Signed)
[place] [facility] Approved Prior Authorization # [id number] Quetiapine 25 mg Tablet           Effective : 12/07/17 ---- 12/02/18

## 2017-12-28 ENCOUNTER — Telehealth (HOSPITAL_COMMUNITY): Payer: Self-pay | Admitting: *Deleted

## 2017-12-28 ENCOUNTER — Other Ambulatory Visit (HOSPITAL_COMMUNITY): Payer: Self-pay | Admitting: Psychiatry

## 2017-12-28 MED ORDER — QUETIAPINE FUMARATE 25 MG PO TABS: 25.0000 mg | ORAL_TABLET | Freq: Every day | ORAL | 0 refills | Status: DC

## 2017-12-28 NOTE — Telephone Encounter (Signed)
Dr Modesta Messing  Patient called requesting refill on Seroquel Next appointment is 01/11/18

## 2017-12-28 NOTE — Telephone Encounter (Signed)
ordered

## 2017-12-31 ENCOUNTER — Telehealth (HOSPITAL_COMMUNITY): Payer: Self-pay

## 2017-12-31 NOTE — Telephone Encounter (Signed)
Pt was scheduled put never came in -called today and wanted to r/s -MD order was not in bin to scan into epic. I faxed a request to update referral and schedule. NF 12/31/17

## 2018-01-05 NOTE — Progress Notes (Deleted)
BH MD/PA/NP OP Progress Note  01/05/2018 1:59 PM Angelica Lopez  MRN:  [id number]  Chief Complaint:  HPI: *** Visit Diagnosis: No diagnosis found.  Past Psychiatric History: Please see initial evaluation for full details. I have reviewed the history. No updates at this time.     Past Medical History:  Past Medical History:  Diagnosis Date  . Hypertension   . Kyphosis   . Scheuermann's disease     Past Surgical History:  Procedure Laterality Date  . CESAREAN SECTION    . TONSILLECTOMY      Family Psychiatric History: Please see initial evaluation for full details. I have reviewed the history. No updates at this time.     Family History:  Family History  Problem Relation Age of Onset  . Anxiety disorder Mother   . Depression Mother   . Hypertension Father   . Diabetes Father   . Anxiety disorder Father   . Depression Father   . Heart disease Maternal Grandfather        has had 3 open heart surgeries  . Febrile seizures Daughter   . Febrile seizures Daughter   . Febrile seizures Daughter     Social History:  Social History   Socioeconomic History  . Marital status: Legally Separated    Spouse name: Not on file  . Number of children: Not on file  . Years of education: Not on file  . Highest education level: Not on file  Occupational History  . Not on file  Social Needs  . Financial resource strain: Not on file  . Food insecurity:    Worry: Not on file    Inability: Not on file  . Transportation needs:    Medical: Not on file    Non-medical: Not on file  Tobacco Use  . Smoking status: Current Every Day Smoker    Packs/day: 1.00    Years: 15.00    Pack years: 15.00    Types: Cigarettes  . Smokeless tobacco: Never Used  Substance and Sexual Activity  . Alcohol use: No  . Drug use: Yes    Types: Marijuana    Comment: twice a week  . Sexual activity: Not Currently    Birth control/protection: IUD  Lifestyle  . Physical activity:    Days per  week: Not on file    Minutes per session: Not on file  . Stress: Not on file  Relationships  . Social connections:    Talks on phone: Not on file    Gets together: Not on file    Attends religious service: Not on file    Active member of club or organization: Not on file    Attends meetings of clubs or organizations: Not on file    Relationship status: Not on file  Other Topics Concern  . Not on file  Social History Narrative  . Not on file    Allergies:  Allergies  Allergen Reactions  . Codeine Anaphylaxis    Reaction:Tongue swells    Metabolic Disorder Labs: No results found for: HGBA1C, MPG No results found for: PROLACTIN No results found for: CHOL, TRIG, HDL, CHOLHDL, VLDL, LDLCALC Lab Results  Component Value Date   TSH 1.81 12/06/2017   TSH 1.422 05/29/2009    Therapeutic Level Labs: No results found for: LITHIUM No results found for: VALPROATE No components found for:  CBMZ  Current Medications: Current Outpatient Medications  Medication Sig Dispense Refill  . Buprenorphine HCl-Naloxone HCl (SUBOXONE) 8-2 MG  FILM Place under the tongue daily.    . cetirizine (ZYRTEC) 10 MG tablet Take 10 mg by mouth daily.    . cholecalciferol (VITAMIN D) 1000 UNITS tablet Take 1,000 Units by mouth daily. Take 2 tabs daily    . ibuprofen (ADVIL,MOTRIN) 200 MG tablet Take 400 mg by mouth every 6 (six) hours as needed for headache or mild pain.     Marland Kitchen levonorgestrel (MIRENA) 20 MCG/24HR IUD 1 each by Intrauterine route once.    Marland Kitchen lisinopril (PRINIVIL,ZESTRIL) 10 MG tablet Take 10 mg by mouth daily.    Marland Kitchen PARoxetine (PAXIL) 40 MG tablet Take 40 mg by mouth every morning.    Marland Kitchen QUEtiapine (SEROQUEL) 25 MG tablet Take 1 tablet (25 mg total) by mouth at bedtime. 30 tablet 0   No current facility-administered medications for this visit.      Musculoskeletal: Strength & Muscle Tone: within normal limits Gait & Station: normal Patient leans: N/A  Psychiatric Specialty Exam: ROS   There were no vitals taken for this visit.There is no height or weight on file to calculate BMI.  General Appearance: Fairly Groomed  Eye Contact:  Good  Speech:  Clear and Coherent  Volume:  Normal  Mood:  {BHH MOOD:22306}  Affect:  {Affect (PAA):22687}  Thought Process:  Coherent  Orientation:  Full (Time, Place, and Person)  Thought Content: Logical   Suicidal Thoughts:  {ST/HT (PAA):22692}  Homicidal Thoughts:  {ST/HT (PAA):22692}  Memory:  Immediate;   Good  Judgement:  {Judgement (PAA):22694}  Insight:  {Insight (PAA):22695}  Psychomotor Activity:  Normal  Concentration:  Concentration: Good and Attention Span: Good  Recall:  Good  Fund of Knowledge: Good  Language: Good  Akathisia:  No  Handed:  Right  AIMS (if indicated): not done  Assets:  Communication Skills Desire for Improvement  ADL's:  Intact  Cognition: WNL  Sleep:  {BHH GOOD/FAIR/POOR:22877}   Screenings:   Assessment and Plan:  Angelica Lopez is a 37 y.o. year old female with a history of depression, anxiety,  opioid use disorder in sustained remission on Suboxone , who presents for follow up appointment for No diagnosis found.  # MDD, moderate, recurrent without psychotic features # GAD  Patient reports depressive symptoms and anxiety, which has worsening over the past few months.  Psychosocial stressors including being a caregiver of her grandfather and her children, divorce and unemployment.  Will add quetiapine as adjunctive treatment for depression and anxiety.  Discussed potential metabolic side effect and drowsiness.  Although she may benefit from a higher dose of Paxil, she prefers to stay at the same dose.  Discussed behavioral activation.  She will greatly benefit from CBT; will provide information for therapist.  Plan 1. Continue Paxil 40 mg daily  2. Start quetiapine 25 mg at night 3. Return to clinic in one month for 30 mins 4. Check TSH 5. Referral to therapy outside of [facility]  The  patient demonstrates the following risk factors for suicide: Chronic risk factors for suicide include: psychiatric disorder of depression, anxiety and substance use disorder. Acute risk factors for suicide include: family or marital conflict and unemployment. Protective factors for this patient include: responsibility to others (children, family), coping skills and hope for the future. Considering these factors, the overall suicide risk at this point appears to be low. Patient is appropriate for outpatient follow up.    [name], MD 01/05/2018, 1:59 PM

## 2018-01-10 ENCOUNTER — Encounter: Payer: Self-pay | Admitting: Obstetrics & Gynecology

## 2018-01-10 ENCOUNTER — Encounter (INDEPENDENT_AMBULATORY_CARE_PROVIDER_SITE_OTHER): Payer: Self-pay

## 2018-01-10 ENCOUNTER — Other Ambulatory Visit (HOSPITAL_COMMUNITY)
Admission: RE | Admit: 2018-01-10 | Discharge: 2018-01-10 | Disposition: A | Discharge status: Home / Self Care | Payer: Medicaid Other | Source: Ambulatory Visit | Attending: Obstetrics & Gynecology | Admitting: Obstetrics & Gynecology

## 2018-01-10 ENCOUNTER — Ambulatory Visit: Payer: Medicaid Other | Admitting: Obstetrics & Gynecology

## 2018-01-10 VITALS — BP 94/69 | HR 87 | Ht 61.0 in | Wt 205.0 lb

## 2018-01-10 DIAGNOSIS — Z01419 Encounter for gynecological examination (general) (routine) without abnormal findings: Secondary | ICD-10-CM

## 2018-01-10 DIAGNOSIS — Z0000 Encounter for general adult medical examination without abnormal findings: Secondary | ICD-10-CM | POA: Diagnosis not present

## 2018-01-10 NOTE — Progress Notes (Signed)
Subjective:     Angelica Lopez is a 37 y.o. female here for a routine exam.  No LMP recorded. (Menstrual status: IUD). No obstetric history on file. Birth Control Method:  IUD Menstrual Calendar(currently): amenorrheic  Current complaints: none.   Current acute medical issues:     Recent Gynecologic History No LMP recorded. (Menstrual status: IUD). Last Pap: 2017,  LSIL Last mammogram: na,    Past Medical History:  Diagnosis Date  . Hypertension   . Kyphosis   . Scheuermann's disease     Past Surgical History:  Procedure Laterality Date  . CESAREAN SECTION    . TONSILLECTOMY      OB History   None     Social History   Socioeconomic History  . Marital status: Legally Separated    Spouse name: Not on file  . Number of children: 3  . Years of education: Not on file  . Highest education level: Not on file  Occupational History  . Not on file  Social Needs  . Financial resource strain: Not on file  . Food insecurity:    Worry: Not on file    Inability: Not on file  . Transportation needs:    Medical: Not on file    Non-medical: Not on file  Tobacco Use  . Smoking status: Current Every Day Smoker    Packs/day: 1.00    Years: 15.00    Pack years: 15.00    Types: Cigarettes  . Smokeless tobacco: Never Used  Substance and Sexual Activity  . Alcohol use: No  . Drug use: Yes    Types: Marijuana    Comment: twice a week  . Sexual activity: Not Currently    Birth control/protection: IUD  Lifestyle  . Physical activity:    Days per week: Not on file    Minutes per session: Not on file  . Stress: Not on file  Relationships  . Social connections:    Talks on phone: Not on file    Gets together: Not on file    Attends religious service: Not on file    Active member of club or organization: Not on file    Attends meetings of clubs or organizations: Not on file    Relationship status: Not on file  Other Topics Concern  . Not on file  Social History  Narrative  . Not on file    Family History  Problem Relation Age of Onset  . Anxiety disorder Mother   . Depression Mother   . Hypertension Father   . Diabetes Father   . Anxiety disorder Father   . Depression Father   . Heart disease Maternal Grandfather        has had 3 open heart surgeries  . Febrile seizures Daughter   . Febrile seizures Daughter   . Febrile seizures Daughter      Current Outpatient Medications:  .  cetirizine (ZYRTEC) 10 MG tablet, Take 10 mg by mouth daily., Disp: , Rfl:  .  cholecalciferol (VITAMIN D) 1000 UNITS tablet, Take 1,000 Units by mouth daily. Take 2 tabs daily, Disp: , Rfl:  .  cloNIDine (CATAPRES) 0.1 MG tablet, Take 0.1 mg by mouth daily., Disp: , Rfl:  .  ibuprofen (ADVIL,MOTRIN) 200 MG tablet, Take 400 mg by mouth every 6 (six) hours as needed for headache or mild pain. , Disp: , Rfl:  .  levonorgestrel (MIRENA) 20 MCG/24HR IUD, 1 each by Intrauterine route once., Disp: , Rfl:  .  lisinopril (PRINIVIL,ZESTRIL) 10 MG tablet, Take 10 mg by mouth daily., Disp: , Rfl:  .  PARoxetine (PAXIL) 40 MG tablet, Take 40 mg by mouth every morning., Disp: , Rfl:  .  QUEtiapine (SEROQUEL) 25 MG tablet, Take 1 tablet (25 mg total) by mouth at bedtime., Disp: 30 tablet, Rfl: 0 .  Buprenorphine HCl-Naloxone HCl (SUBOXONE) 8-2 MG FILM, Place under the tongue daily., Disp: , Rfl:   Review of Systems  Review of Systems  Constitutional: Negative for fever, chills, weight loss, malaise/fatigue and diaphoresis.  HENT: Negative for hearing loss, ear pain, nosebleeds, congestion, sore throat, neck pain, tinnitus and ear discharge.   Eyes: Negative for blurred vision, double vision, photophobia, pain, discharge and redness.  Respiratory: Negative for cough, hemoptysis, sputum production, shortness of breath, wheezing and stridor.   Cardiovascular: Negative for chest pain, palpitations, orthopnea, claudication, leg swelling and PND.  Gastrointestinal: negative for  abdominal pain. Negative for heartburn, nausea, vomiting, diarrhea, constipation, blood in stool and melena.  Genitourinary: Negative for dysuria, urgency, frequency, hematuria and flank pain.  Musculoskeletal: Negative for myalgias, back pain, joint pain and falls.  Skin: Negative for itching and rash.  Neurological: Negative for dizziness, tingling, tremors, sensory change, speech change, focal weakness, seizures, loss of consciousness, weakness and headaches.  Endo/Heme/Allergies: Negative for environmental allergies and polydipsia. Does not bruise/bleed easily.  Psychiatric/Behavioral: Negative for depression, suicidal ideas, hallucinations, memory loss and substance abuse. The patient is not nervous/anxious and does not have insomnia.        Objective:  Blood pressure 94/69, pulse 87, height 5\' 1"  (1.549 m), weight 205 lb (93 kg).   Physical Exam  Vitals reviewed. Constitutional: She is oriented to person, place, and time. She appears well-developed and well-nourished.  HENT:  Head: Normocephalic and atraumatic.        Right Ear: External ear normal.  Left Ear: External ear normal.  Nose: Nose normal.  Mouth/Throat: Oropharynx is clear and moist.  Eyes: Conjunctivae and EOM are normal. Pupils are equal, round, and reactive to light. Right eye exhibits no discharge. Left eye exhibits no discharge. No scleral icterus.  Neck: Normal range of motion. Neck supple. No tracheal deviation present. No thyromegaly present.  Cardiovascular: Normal rate, regular rhythm, normal heart sounds and intact distal pulses.  Exam reveals no gallop and no friction rub.   No murmur heard. Respiratory: Effort normal and breath sounds normal. No respiratory distress. She has no wheezes. She has no rales. She exhibits no tenderness.  GI: Soft. Bowel sounds are normal. She exhibits no distension and no mass. There is no tenderness. There is no rebound and no guarding.  Genitourinary:  Breasts no masses skin  changes or nipple changes bilaterally      Vulva is normal without lesions Vagina is pink moist without discharge Cervix normal in appearance and pap is done Uterus is normal size shape and contour Adnexa is negative with normal sized ovaries   Musculoskeletal: Normal range of motion. She exhibits no edema and no tenderness.  Neurological: She is alert and oriented to person, place, and time. She has normal reflexes. She displays normal reflexes. No cranial nerve deficit. She exhibits normal muscle tone. Coordination normal.  Skin: Skin is warm and dry. No rash noted. No erythema. No pallor.  Psychiatric: She has a normal mood and affect. Her behavior is normal. Judgment and thought content normal.       Medications Ordered at today's visit: No orders of the defined types were placed  in this encounter.   Other orders placed at today's visit: No orders of the defined types were placed in this encounter.     Assessment:    Healthy female exam.    Plan:    Contraception: IUD. Follow up in: 1 year.     Return in about 1 year (around 01/11/2019) for yearly.

## 2018-01-10 NOTE — Addendum Note (Signed)
Addended by: Christiana Pellant A on: 01/10/2018 12:01 PM   Modules accepted: Orders

## 2018-01-11 ENCOUNTER — Ambulatory Visit (HOSPITAL_COMMUNITY): Payer: Medicaid Other | Admitting: Psychiatry

## 2018-01-12 LAB — CYTOLOGY - PAP
Chlamydia: NEGATIVE
Diagnosis: UNDETERMINED — AB
HPV (WINDOPATH): NOT DETECTED
Neisseria Gonorrhea: NEGATIVE

## 2018-01-13 ENCOUNTER — Encounter (HOSPITAL_COMMUNITY): Payer: Self-pay

## 2018-01-13 ENCOUNTER — Ambulatory Visit (HOSPITAL_COMMUNITY): Payer: Medicaid Other

## 2018-01-17 NOTE — Progress Notes (Deleted)
[facility] MD/PA/NP OP Progress Note  01/17/2018 1:15 PM Angelica Lopez  MRN:  [id number]  Chief Complaint:  HPI: *** Visit Diagnosis: No diagnosis found.  Past Psychiatric History: Please see initial evaluation for full details. I have reviewed the history. No updates at this time.     Past Medical History:  Past Medical History:  Diagnosis Date  . Hypertension   . Kyphosis   . Scheuermann's disease     Past Surgical History:  Procedure Laterality Date  . CESAREAN SECTION    . TONSILLECTOMY      Family Psychiatric History: Please see initial evaluation for full details. I have reviewed the history. No updates at this time.     Family History:  Family History  Problem Relation Age of Onset  . Anxiety disorder Mother   . Depression Mother   . Hypertension Father   . Diabetes Father   . Anxiety disorder Father   . Depression Father   . Heart disease Maternal Grandfather        has had 3 open heart surgeries  . Febrile seizures Daughter   . Febrile seizures Daughter   . Febrile seizures Daughter     Social History:  Social History   Socioeconomic History  . Marital status: Legally Separated    Spouse name: Not on file  . Number of children: 3  . Years of education: Not on file  . Highest education level: Not on file  Occupational History  . Not on file  Social Needs  . Financial resource strain: Not on file  . Food insecurity:    Worry: Not on file    Inability: Not on file  . Transportation needs:    Medical: Not on file    Non-medical: Not on file  Tobacco Use  . Smoking status: Current Every Day Smoker    Packs/day: 1.00    Years: 15.00    Pack years: 15.00    Types: Cigarettes  . Smokeless tobacco: Never Used  Substance and Sexual Activity  . Alcohol use: No  . Drug use: Yes    Types: Marijuana    Comment: twice a week  . Sexual activity: Not Currently    Birth control/protection: IUD  Lifestyle  . Physical activity:    Days per week: Not on  file    Minutes per session: Not on file  . Stress: Not on file  Relationships  . Social connections:    Talks on phone: Not on file    Gets together: Not on file    Attends religious service: Not on file    Active member of club or organization: Not on file    Attends meetings of clubs or organizations: Not on file    Relationship status: Not on file  Other Topics Concern  . Not on file  Social History Narrative  . Not on file    Allergies:  Allergies  Allergen Reactions  . Codeine Anaphylaxis    Reaction:Tongue swells    Metabolic Disorder Labs: No results found for: HGBA1C, MPG No results found for: PROLACTIN No results found for: CHOL, TRIG, HDL, CHOLHDL, VLDL, LDLCALC Lab Results  Component Value Date   TSH 1.81 12/06/2017   TSH 1.422 05/29/2009    Therapeutic Level Labs: No results found for: LITHIUM No results found for: VALPROATE No components found for:  CBMZ  Current Medications: Current Outpatient Medications  Medication Sig Dispense Refill  . Buprenorphine HCl-Naloxone HCl (SUBOXONE) 8-2 MG FILM Place  under the tongue daily.    . cetirizine (ZYRTEC) 10 MG tablet Take 10 mg by mouth daily.    . cholecalciferol (VITAMIN D) 1000 UNITS tablet Take 1,000 Units by mouth daily. Take 2 tabs daily    . cloNIDine (CATAPRES) 0.1 MG tablet Take 0.1 mg by mouth daily.    Marland Kitchen ibuprofen (ADVIL,MOTRIN) 200 MG tablet Take 400 mg by mouth every 6 (six) hours as needed for headache or mild pain.     Marland Kitchen levonorgestrel (MIRENA) 20 MCG/24HR IUD 1 each by Intrauterine route once.    Marland Kitchen lisinopril (PRINIVIL,ZESTRIL) 10 MG tablet Take 10 mg by mouth daily.    Marland Kitchen PARoxetine (PAXIL) 40 MG tablet Take 40 mg by mouth every morning.    Marland Kitchen QUEtiapine (SEROQUEL) 25 MG tablet Take 1 tablet (25 mg total) by mouth at bedtime. 30 tablet 0   No current facility-administered medications for this visit.      Musculoskeletal: Strength & Muscle Tone: within normal limits Gait & Station:  normal Patient leans: N/A  Psychiatric Specialty Exam: ROS  There were no vitals taken for this visit.There is no height or weight on file to calculate BMI.  General Appearance: Fairly Groomed  Eye Contact:  Good  Speech:  Clear and Coherent  Volume:  Normal  Mood:  {BHH MOOD:22306}  Affect:  {Affect (PAA):22687}  Thought Process:  Coherent  Orientation:  Full (Time, Place, and Person)  Thought Content: Logical   Suicidal Thoughts:  {ST/HT (PAA):22692}  Homicidal Thoughts:  {ST/HT (PAA):22692}  Memory:  Immediate;   Good  Judgement:  {Judgement (PAA):22694}  Insight:  {Insight (PAA):22695}  Psychomotor Activity:  Normal  Concentration:  Concentration: Good and Attention Span: Good  Recall:  Good  Fund of Knowledge: Good  Language: Good  Akathisia:  No  Handed:  Right  AIMS (if indicated): not done  Assets:  Communication Skills Desire for Improvement  ADL's:  Intact  Cognition: WNL  Sleep:  {BHH GOOD/FAIR/POOR:22877}   Screenings: PHQ2-9     Office Visit from 01/10/2018 in [facility]  PHQ-2 Total Score  0       Assessment and Plan:  Angelica Lopez is a 37 y.o. year old female with a history of depression, anxiety,opioid use disorder in sustained remission on Suboxone , who presents for follow up appointment for No diagnosis found.  # MDD, moderate, recurrent without psychotic features # GAD   Patient reports depressive symptoms and anxiety, which has worsening over the past few months.  Psychosocial stressors including being a caregiver of her grandfather and her children, divorce and unemployment.  Will add quetiapine as adjunctive treatment for depression and anxiety.  Discussed potential metabolic side effect and drowsiness.  Although she may benefit from a higher dose of Paxil, she prefers to stay at the same dose.  Discussed behavioral activation.  She will greatly benefit from CBT; will provide information for therapist.  Plan 1. Continue Paxil 40 mg  daily  2. Start quetiapine 25 mg at night 3. Return to clinic in one month for 30 mins 4. Check TSH 5. Referral to therapy outside of [facility]  The patient demonstrates the following risk factors for suicide: Chronic risk factors for suicide include: psychiatric disorder of depression, anxiety and substance use disorder. Acute risk factors for suicide include: family or marital conflict and unemployment. Protective factors for this patient include: responsibility to others (children, family), coping skills and hope for the future. Considering these factors, the overall suicide risk  at this point appears to be low. Patient is appropriate for outpatient follow up.   [name], MD 01/17/2018, 1:15 PM

## 2018-01-21 ENCOUNTER — Ambulatory Visit (HOSPITAL_COMMUNITY): Payer: Medicaid Other | Admitting: Psychiatry

## 2018-01-24 ENCOUNTER — Telehealth (HOSPITAL_COMMUNITY): Payer: Self-pay | Admitting: *Deleted

## 2018-01-24 ENCOUNTER — Other Ambulatory Visit (HOSPITAL_COMMUNITY): Payer: Self-pay | Admitting: Psychiatry

## 2018-01-24 MED ORDER — QUETIAPINE FUMARATE 25 MG PO TABS: 25.0000 mg | ORAL_TABLET | Freq: Every day | ORAL | 1 refills | Status: DC

## 2018-01-24 NOTE — Telephone Encounter (Signed)
Dr Modesta Messing  Patient called stating she missed her previous appointment when school was delayed  Thinking ice was coming. Next appointment is  03/10/18 & she requested refill on Seroquel

## 2018-01-24 NOTE — Telephone Encounter (Signed)
ordered

## 2018-01-24 NOTE — Telephone Encounter (Signed)
Requesting refill on Seroquel

## 2018-02-24 DIAGNOSIS — E559 Vitamin D deficiency, unspecified: Secondary | ICD-10-CM | POA: Insufficient documentation

## 2018-03-08 NOTE — Progress Notes (Deleted)
BH MD/PA/NP OP Progress Note  03/08/2018 9:53 AM Angelica Lopez  MRN:  [id number]  Chief Complaint:  HPI: *** Visit Diagnosis: No diagnosis found.  Past Psychiatric History: Please see initial evaluation for full details. I have reviewed the history. No updates at this time.     Past Medical History:  Past Medical History:  Diagnosis Date  . Hypertension   . Kyphosis   . Scheuermann's disease     Past Surgical History:  Procedure Laterality Date  . CESAREAN SECTION    . TONSILLECTOMY      Family Psychiatric History: Please see initial evaluation for full details. I have reviewed the history. No updates at this time.     Family History:  Family History  Problem Relation Age of Onset  . Anxiety disorder Mother   . Depression Mother   . Hypertension Father   . Diabetes Father   . Anxiety disorder Father   . Depression Father   . Heart disease Maternal Grandfather        has had 3 open heart surgeries  . Febrile seizures Daughter   . Febrile seizures Daughter   . Febrile seizures Daughter     Social History:  Social History   Socioeconomic History  . Marital status: Legally Separated    Spouse name: Not on file  . Number of children: 3  . Years of education: Not on file  . Highest education level: Not on file  Occupational History  . Not on file  Social Needs  . Financial resource strain: Not on file  . Food insecurity:    Worry: Not on file    Inability: Not on file  . Transportation needs:    Medical: Not on file    Non-medical: Not on file  Tobacco Use  . Smoking status: Current Every Day Smoker    Packs/day: 1.00    Years: 15.00    Pack years: 15.00    Types: Cigarettes  . Smokeless tobacco: Never Used  Substance and Sexual Activity  . Alcohol use: No  . Drug use: Yes    Types: Marijuana    Comment: twice a week  . Sexual activity: Not Currently    Birth control/protection: I.U.D.  Lifestyle  . Physical activity:    Days per week: Not  on file    Minutes per session: Not on file  . Stress: Not on file  Relationships  . Social connections:    Talks on phone: Not on file    Gets together: Not on file    Attends religious service: Not on file    Active member of club or organization: Not on file    Attends meetings of clubs or organizations: Not on file    Relationship status: Not on file  Other Topics Concern  . Not on file  Social History Narrative  . Not on file    Allergies:  Allergies  Allergen Reactions  . Codeine Anaphylaxis    Reaction:Tongue swells    Metabolic Disorder Labs: No results found for: HGBA1C, MPG No results found for: PROLACTIN No results found for: CHOL, TRIG, HDL, CHOLHDL, VLDL, LDLCALC Lab Results  Component Value Date   TSH 1.81 12/06/2017   TSH 1.422 05/29/2009    Therapeutic Level Labs: No results found for: LITHIUM No results found for: VALPROATE No components found for:  CBMZ  Current Medications: Current Outpatient Medications  Medication Sig Dispense Refill  . Buprenorphine HCl-Naloxone HCl (SUBOXONE) 8-2 MG FILM Place  under the tongue daily.    . cetirizine (ZYRTEC) 10 MG tablet Take 10 mg by mouth daily.    . cholecalciferol (VITAMIN D) 1000 UNITS tablet Take 1,000 Units by mouth daily. Take 2 tabs daily    . cloNIDine (CATAPRES) 0.1 MG tablet Take 0.1 mg by mouth daily.    Marland Kitchen ibuprofen (ADVIL,MOTRIN) 200 MG tablet Take 400 mg by mouth every 6 (six) hours as needed for headache or mild pain.     Marland Kitchen levonorgestrel (MIRENA) 20 MCG/24HR IUD 1 each by Intrauterine route once.    Marland Kitchen lisinopril (PRINIVIL,ZESTRIL) 10 MG tablet Take 10 mg by mouth daily.    Marland Kitchen PARoxetine (PAXIL) 40 MG tablet Take 40 mg by mouth every morning.    Marland Kitchen QUEtiapine (SEROQUEL) 25 MG tablet Take 1 tablet (25 mg total) by mouth at bedtime. 30 tablet 1   No current facility-administered medications for this visit.      Musculoskeletal: Strength & Muscle Tone: within normal limits Gait & Station:  normal Patient leans: N/A  Psychiatric Specialty Exam: ROS  There were no vitals taken for this visit.There is no height or weight on file to calculate BMI.  General Appearance: Fairly Groomed  Eye Contact:  Good  Speech:  Clear and Coherent  Volume:  Normal  Mood:  {BHH MOOD:22306}  Affect:  {Affect (PAA):22687}  Thought Process:  Coherent  Orientation:  Full (Time, Place, and Person)  Thought Content: Logical   Suicidal Thoughts:  {ST/HT (PAA):22692}  Homicidal Thoughts:  {ST/HT (PAA):22692}  Memory:  Immediate;   Good  Judgement:  {Judgement (PAA):22694}  Insight:  {Insight (PAA):22695}  Psychomotor Activity:  Normal  Concentration:  Concentration: Good and Attention Span: Good  Recall:  Good  Fund of Knowledge: Good  Language: Good  Akathisia:  No  Handed:  Right  AIMS (if indicated): not done  Assets:  Communication Skills Desire for Improvement  ADL's:  Intact  Cognition: WNL  Sleep:  {BHH GOOD/FAIR/POOR:22877}   Screenings: PHQ2-9     Office Visit from 01/10/2018 in [facility]  PHQ-2 Total Score  0       Assessment and Plan:  Angelica Lopez is a 38 y.o. year old female with a history of depression, anxiety,opioid use disorder in sustained remission on Suboxone , who presents for follow up appointment for No diagnosis found.  # MDD, moderate, recurrent without psychotic features # GAD  Patient reports depressive symptoms and anxiety, which has worsening over the past few months.  Psychosocial stressors including being a caregiver of her grandfather and her children, divorce and unemployment.  Will add quetiapine as adjunctive treatment for depression and anxiety.  Discussed potential metabolic side effect and drowsiness.  Although she may benefit from a higher dose of Paxil, she prefers to stay at the same dose.  Discussed behavioral activation.  She will greatly benefit from CBT; will provide information for therapist.  Plan 1. Continue Paxil 40 mg  daily  2. Start quetiapine 25 mg at night 3. Return to clinic in one month for 30 mins 4. Check TSH 5. Referral to therapy outside of [facility]  The patient demonstrates the following risk factors for suicide: Chronic risk factors for suicide include: psychiatric disorder of depression, anxiety and substance use disorder. Acute risk factors for suicide include: family or marital conflict and unemployment. Protective factors for this patient include: responsibility to others (children, family), coping skills and hope for the future. Considering these factors, the overall suicide risk at  this point appears to be low. Patient is appropriate for outpatient follow up.  [name], MD 03/08/2018, 9:53 AM

## 2018-03-10 ENCOUNTER — Ambulatory Visit (HOSPITAL_COMMUNITY): Payer: Medicaid Other | Admitting: Psychiatry

## 2018-03-16 NOTE — Progress Notes (Deleted)
BH MD/PA/NP OP Progress Note  03/16/2018 10:35 AM Angelica Lopez  MRN:  [id number]  Chief Complaint:  HPI: *** Visit Diagnosis: No diagnosis found.  Past Psychiatric History: Please see initial evaluation for full details. I have reviewed the history. No updates at this time.     Past Medical History:  Past Medical History:  Diagnosis Date  . Hypertension   . Kyphosis   . Scheuermann's disease     Past Surgical History:  Procedure Laterality Date  . CESAREAN SECTION    . TONSILLECTOMY      Family Psychiatric History: Please see initial evaluation for full details. I have reviewed the history. No updates at this time.     Family History:  Family History  Problem Relation Age of Onset  . Anxiety disorder Mother   . Depression Mother   . Hypertension Father   . Diabetes Father   . Anxiety disorder Father   . Depression Father   . Heart disease Maternal Grandfather        has had 3 open heart surgeries  . Febrile seizures Daughter   . Febrile seizures Daughter   . Febrile seizures Daughter     Social History:  Social History   Socioeconomic History  . Marital status: Legally Separated    Spouse name: Not on file  . Number of children: 3  . Years of education: Not on file  . Highest education level: Not on file  Occupational History  . Not on file  Social Needs  . Financial resource strain: Not on file  . Food insecurity:    Worry: Not on file    Inability: Not on file  . Transportation needs:    Medical: Not on file    Non-medical: Not on file  Tobacco Use  . Smoking status: Current Every Day Smoker    Packs/day: 1.00    Years: 15.00    Pack years: 15.00    Types: Cigarettes  . Smokeless tobacco: Never Used  Substance and Sexual Activity  . Alcohol use: No  . Drug use: Yes    Types: Marijuana    Comment: twice a week  . Sexual activity: Not Currently    Birth control/protection: I.U.D.  Lifestyle  . Physical activity:    Days per week: Not  on file    Minutes per session: Not on file  . Stress: Not on file  Relationships  . Social connections:    Talks on phone: Not on file    Gets together: Not on file    Attends religious service: Not on file    Active member of club or organization: Not on file    Attends meetings of clubs or organizations: Not on file    Relationship status: Not on file  Other Topics Concern  . Not on file  Social History Narrative  . Not on file    Allergies:  Allergies  Allergen Reactions  . Codeine Anaphylaxis    Reaction:Tongue swells    Metabolic Disorder Labs: No results found for: HGBA1C, MPG No results found for: PROLACTIN No results found for: CHOL, TRIG, HDL, CHOLHDL, VLDL, LDLCALC Lab Results  Component Value Date   TSH 1.81 12/06/2017   TSH 1.422 05/29/2009    Therapeutic Level Labs: No results found for: LITHIUM No results found for: VALPROATE No components found for:  CBMZ  Current Medications: Current Outpatient Medications  Medication Sig Dispense Refill  . Buprenorphine HCl-Naloxone HCl (SUBOXONE) 8-2 MG FILM Place  under the tongue daily.    . cetirizine (ZYRTEC) 10 MG tablet Take 10 mg by mouth daily.    . cholecalciferol (VITAMIN D) 1000 UNITS tablet Take 1,000 Units by mouth daily. Take 2 tabs daily    . cloNIDine (CATAPRES) 0.1 MG tablet Take 0.1 mg by mouth daily.    Marland Kitchen ibuprofen (ADVIL,MOTRIN) 200 MG tablet Take 400 mg by mouth every 6 (six) hours as needed for headache or mild pain.     Marland Kitchen levonorgestrel (MIRENA) 20 MCG/24HR IUD 1 each by Intrauterine route once.    Marland Kitchen lisinopril (PRINIVIL,ZESTRIL) 10 MG tablet Take 10 mg by mouth daily.    Marland Kitchen PARoxetine (PAXIL) 40 MG tablet Take 40 mg by mouth every morning.    Marland Kitchen QUEtiapine (SEROQUEL) 25 MG tablet Take 1 tablet (25 mg total) by mouth at bedtime. 30 tablet 1   No current facility-administered medications for this visit.      Musculoskeletal: Strength & Muscle Tone: within normal limits Gait & Station:  normal Patient leans: N/A  Psychiatric Specialty Exam: ROS  There were no vitals taken for this visit.There is no height or weight on file to calculate BMI.  General Appearance: Fairly Groomed  Eye Contact:  Good  Speech:  Clear and Coherent  Volume:  Normal  Mood:  {BHH MOOD:22306}  Affect:  {Affect (PAA):22687}  Thought Process:  Coherent  Orientation:  Full (Time, Place, and Person)  Thought Content: Logical   Suicidal Thoughts:  {ST/HT (PAA):22692}  Homicidal Thoughts:  {ST/HT (PAA):22692}  Memory:  Immediate;   Good  Judgement:  {Judgement (PAA):22694}  Insight:  {Insight (PAA):22695}  Psychomotor Activity:  Normal  Concentration:  Concentration: Good and Attention Span: Good  Recall:  Good  Fund of Knowledge: Good  Language: Good  Akathisia:  No  Handed:  Right  AIMS (if indicated): not done  Assets:  Communication Skills Desire for Improvement  ADL's:  Intact  Cognition: WNL  Sleep:  {BHH GOOD/FAIR/POOR:22877}   Screenings: PHQ2-9     Office Visit from 01/10/2018 in [facility]  PHQ-2 Total Score  0       Assessment and Plan:  Angelica Lopez is a 38 y.o. year old female with a history of depression, anxiety, opioid use disorder in sustained remission on Suboxone, who presents for follow up appointment for No diagnosis found.  # MDD, moderate, recurrent without psychotic features # GAD  Patient reports depressive symptoms and anxiety, which has worsening over the past few months.  Psychosocial stressors including being a caregiver of her grandfather and her children, divorce and unemployment.  Will add quetiapine as adjunctive treatment for depression and anxiety.  Discussed potential metabolic side effect and drowsiness.  Although she may benefit from a higher dose of Paxil, she prefers to stay at the same dose.  Discussed behavioral activation.  She will greatly benefit from CBT; will provide information for therapist.  Plan 1. Continue Paxil 40 mg  daily  2. Start quetiapine 25 mg at night 3. Return to clinic in one month for 30 mins 4. Check TSH 5. Referral to therapy outside of [facility]  The patient demonstrates the following risk factors for suicide: Chronic risk factors for suicide include: psychiatric disorder of depression, anxiety and substance use disorder. Acute risk factors for suicide include: family or marital conflict and unemployment. Protective factors for this patient include: responsibility to others (children, family), coping skills and hope for the future. Considering these factors, the overall suicide risk at  this point appears to be low. Patient is appropriate for outpatient follow up.  [name], MD 03/16/2018, 10:35 AM

## 2018-03-21 ENCOUNTER — Ambulatory Visit (HOSPITAL_COMMUNITY): Payer: Medicaid Other | Admitting: Psychiatry

## 2018-03-22 ENCOUNTER — Other Ambulatory Visit (HOSPITAL_COMMUNITY): Payer: Self-pay | Admitting: Psychiatry

## 2018-03-22 ENCOUNTER — Telehealth (HOSPITAL_COMMUNITY): Payer: Self-pay | Admitting: *Deleted

## 2018-03-22 ENCOUNTER — Ambulatory Visit (HOSPITAL_COMMUNITY): Payer: Medicaid Other | Admitting: Psychiatry

## 2018-03-22 MED ORDER — QUETIAPINE FUMARATE 25 MG PO TABS: 25.0000 mg | ORAL_TABLET | Freq: Every day | ORAL | 0 refills | Status: DC

## 2018-03-22 NOTE — Telephone Encounter (Signed)
Dr Modesta Messing Patient spoke with [facility] office she had to reschedule her appointment today due to her daughter has the FLU. She requested refills on her medication.

## 2018-03-22 NOTE — Telephone Encounter (Signed)
ordered

## 2018-05-04 ENCOUNTER — Encounter: Payer: Self-pay | Admitting: Podiatry

## 2018-05-04 ENCOUNTER — Other Ambulatory Visit: Payer: Self-pay

## 2018-05-04 ENCOUNTER — Ambulatory Visit: Payer: Medicaid Other | Admitting: Podiatry

## 2018-05-04 DIAGNOSIS — L600 Ingrowing nail: Secondary | ICD-10-CM

## 2018-05-04 DIAGNOSIS — M79675 Pain in left toe(s): Secondary | ICD-10-CM

## 2018-05-04 MED ORDER — GENTAMICIN SULFATE 0.1 % EX CREA: 1.0000 "application " | TOPICAL_CREAM | Freq: Two times a day (BID) | CUTANEOUS | 1 refills | Status: DC

## 2018-05-04 NOTE — Patient Instructions (Signed)

## 2018-05-04 NOTE — Progress Notes (Signed)
   Subjective: Patient presents today for evaluation of pain to the medial border of the left great toe. Patient is concerned for possible ingrown nail. Patient presents today for further treatment and evaluation.  Past Medical History:  Diagnosis Date  . Hypertension   . Kyphosis   . Scheuermann's disease     Objective:  General: Well developed, nourished, in no acute distress, alert and oriented x3   Dermatology: Skin is warm, dry and supple bilateral.  Medial border left great toe appears to be erythematous with evidence of an ingrowing nail. Pain on palpation noted to the border of the nail fold. The remaining nails appear unremarkable at this time. There are no open sores, lesions.  Vascular: Dorsalis Pedis artery and Posterior Tibial artery pedal pulses palpable. No lower extremity edema noted.   Neruologic: Grossly intact via light touch bilateral.  Musculoskeletal: Muscular strength within normal limits in all groups bilateral. Normal range of motion noted to all pedal and ankle joints.   Assesement: #1 Paronychia with ingrowing nail medial border left great toe #2 Pain in toe #3 Incurvated nail  Plan of Care:  1. Patient evaluated.  2. Discussed treatment alternatives and plan of care. Explained nail avulsion procedure and post procedure course to patient. 3. Patient opted for permanent partial nail avulsion.  4. Prior to procedure, local anesthesia infiltration utilized using 3 ml of a 50:50 mixture of 2% plain lidocaine and 0.5% plain marcaine in a normal hallux block fashion and a betadine prep performed.  5. Partial permanent nail avulsion with chemical matrixectomy performed using [id number] applications of phenol followed by alcohol flush.  6. Light dressing applied.  Prescription for gentamicin cream 7. Return to clinic in 2 weeks.   Edrick Kins, DPM [facility]  Dr. Edrick Kins, [place] [address]                                         [place], [place] [address]                Office [phone]  Fax [phone]

## 2018-05-25 ENCOUNTER — Ambulatory Visit: Payer: Medicaid Other | Admitting: Podiatry

## 2018-07-14 DIAGNOSIS — F5104 Psychophysiologic insomnia: Secondary | ICD-10-CM | POA: Insufficient documentation

## 2019-02-17 ENCOUNTER — Ambulatory Visit: Payer: Medicaid Other | Attending: Internal Medicine

## 2019-06-05 ENCOUNTER — Other Ambulatory Visit: Payer: Medicaid Other | Admitting: Obstetrics & Gynecology

## 2019-06-29 ENCOUNTER — Other Ambulatory Visit: Payer: Medicaid Other | Admitting: Obstetrics & Gynecology

## 2019-07-03 ENCOUNTER — Ambulatory Visit: Payer: Medicaid Other

## 2019-07-03 DIAGNOSIS — Z23 Encounter for immunization: Secondary | ICD-10-CM

## 2019-07-03 NOTE — Progress Notes (Signed)
   Covid-19 Vaccination Clinic  Name:  KINJAL SONNER    MRN: [id number] DOB: 1980/09/22  07/03/2019  Ms. Henretty was observed post Covid-19 immunization for 15 minutes without incident. She was provided with Vaccine Information Sheet and instruction to access the V-Safe system.   Ms. Rena was instructed to call 911 with any severe reactions post vaccine: Marland Kitchen Difficulty breathing  . Swelling of face and throat  . A fast heartbeat  . A bad rash all over body  . Dizziness and weakness   Immunizations Administered    Name Date Dose VIS Date Route   Moderna COVID-19 Vaccine 07/03/2019  4:52 PM 0.5 mL 01/2019 Intramuscular   Manufacturer: Levan Hurst   [id number]   [place]VO:[id number]

## 2019-07-17 ENCOUNTER — Ambulatory Visit: Payer: Medicaid Other | Admitting: Obstetrics & Gynecology

## 2019-07-17 ENCOUNTER — Other Ambulatory Visit (HOSPITAL_COMMUNITY)
Admission: RE | Admit: 2019-07-17 | Discharge: 2019-07-17 | Disposition: A | Discharge status: Home / Self Care | Payer: Medicaid Other | Source: Ambulatory Visit | Attending: Obstetrics & Gynecology | Admitting: Obstetrics & Gynecology

## 2019-07-17 ENCOUNTER — Encounter: Payer: Self-pay | Admitting: Obstetrics & Gynecology

## 2019-07-17 VITALS — BP 121/81 | HR 91 | Ht 61.0 in | Wt 196.0 lb

## 2019-07-17 DIAGNOSIS — Z30430 Encounter for insertion of intrauterine contraceptive device: Secondary | ICD-10-CM

## 2019-07-17 DIAGNOSIS — Z01419 Encounter for gynecological examination (general) (routine) without abnormal findings: Secondary | ICD-10-CM | POA: Diagnosis present

## 2019-07-17 DIAGNOSIS — Z0000 Encounter for general adult medical examination without abnormal findings: Secondary | ICD-10-CM | POA: Diagnosis not present

## 2019-07-17 NOTE — Progress Notes (Signed)
Subjective:     Angelica Lopez is a 39 y.o. female here for a routine exam.  No LMP recorded. (Menstrual status: IUD). No obstetric history on file. Birth Control Method:  IUD mirena Menstrual Calendar(currently): rare periods  Current complaints: none.   Current acute medical issues:     Recent Gynecologic History No LMP recorded. (Menstrual status: IUD). Last Pap: 2019,  normal Last mammogram: ,    Past Medical History:  Diagnosis Date   Hypertension    Kyphosis    Scheuermann's disease     Past Surgical History:  Procedure Laterality Date   CESAREAN SECTION     TONSILLECTOMY      OB History   No obstetric history on file.     Social History   Socioeconomic History   Marital status: Legally Separated    Spouse name: Not on file   Number of children: 3   Years of education: Not on file   Highest education level: Not on file  Occupational History   Not on file  Tobacco Use   Smoking status: Current Every Day Smoker    Packs/day: 1.00    Years: 15.00    Pack years: 15.00    Types: Cigarettes   Smokeless tobacco: Never Used  Substance and Sexual Activity   Alcohol use: No   Drug use: Yes    Types: Marijuana    Comment: twice a week   Sexual activity: Not Currently    Birth control/protection: I.U.D.  Other Topics Concern   Not on file  Social History Narrative   Not on file   Social Determinants of Health   Financial Resource Strain: High Risk   Difficulty of Paying Living Expenses: Hard  Food Insecurity: Food Insecurity Present   Worried About Running Out of Food in the Last Year: Sometimes true   Ran Out of Food in the Last Year: Sometimes true  Transportation Needs: No Transportation Needs   Lack of Transportation (Medical): No   Lack of Transportation (Non-Medical): No  Physical Activity: Inactive   Days of Exercise per Week: 0 days   Minutes of Exercise per Session: 0 min  Stress: Stress Concern Present   Feeling  of Stress : Very much  Social Connections: Slightly Isolated   Frequency of Communication with Friends and Family: More than three times a week   Frequency of Social Gatherings with Friends and Family: More than three times a week   Attends Religious Services: 1 to 4 times per year   Active Member of [employer] or Organizations: No   Attends Music therapist: 1 to 4 times per year   Marital Status: Divorced    Family History  Problem Relation Age of Onset   Anxiety disorder Mother    Depression Mother    Hypertension Father    Diabetes Father    Anxiety disorder Father    Depression Father    Heart disease Maternal Grandfather        has had 3 open heart surgeries   Febrile seizures Daughter    Febrile seizures Daughter    Febrile seizures Daughter      Current Outpatient Medications:    ARIPiprazole (ABILIFY) 5 MG tablet, Take by mouth., Disp: , Rfl:    Buprenorphine HCl-Naloxone HCl (SUBOXONE) 8-2 MG FILM, Place under the tongue daily., Disp: , Rfl:    cetirizine (ZYRTEC) 10 MG tablet, Take 10 mg by mouth daily., Disp: , Rfl:    cholecalciferol (VITAMIN D)  1000 UNITS tablet, Take 1,000 Units by mouth daily. Take 2 tabs daily, Disp: , Rfl:    cloNIDine (CATAPRES) 0.1 MG tablet, Take 0.1 mg by mouth daily., Disp: , Rfl:    ibuprofen (ADVIL,MOTRIN) 200 MG tablet, Take 400 mg by mouth every 6 (six) hours as needed for headache or mild pain. , Disp: , Rfl:    levonorgestrel (MIRENA) 20 MCG/24HR IUD, 1 each by Intrauterine route once., Disp: , Rfl:    lisinopril (PRINIVIL,ZESTRIL) 10 MG tablet, Take 10 mg by mouth daily., Disp: , Rfl:    PARoxetine (PAXIL) 40 MG tablet, Take 40 mg by mouth every morning., Disp: , Rfl:    QUEtiapine (SEROQUEL) 25 MG tablet, Take 1 tablet (25 mg total) by mouth at bedtime for 30 days., Disp: 30 tablet, Rfl: 0   cyclobenzaprine (FLEXERIL) 10 MG tablet, Take 10 mg by mouth 3 (three) times daily as needed., Disp: , Rfl:    Review of Systems  Review of Systems  Constitutional: Negative for fever, chills, weight loss, malaise/fatigue and diaphoresis.  HENT: Negative for hearing loss, ear pain, nosebleeds, congestion, sore throat, neck pain, tinnitus and ear discharge.   Eyes: Negative for blurred vision, double vision, photophobia, pain, discharge and redness.  Respiratory: Negative for cough, hemoptysis, sputum production, shortness of breath, wheezing and stridor.   Cardiovascular: Negative for chest pain, palpitations, orthopnea, claudication, leg swelling and PND.  Gastrointestinal: negative for abdominal pain. Negative for heartburn, nausea, vomiting, diarrhea, constipation, blood in stool and melena.  Genitourinary: Negative for dysuria, urgency, frequency, hematuria and flank pain.  Musculoskeletal: Negative for myalgias, back pain, joint pain and falls.  Skin: Negative for itching and rash.  Neurological: Negative for dizziness, tingling, tremors, sensory change, speech change, focal weakness, seizures, loss of consciousness, weakness and headaches.  Endo/Heme/Allergies: Negative for environmental allergies and polydipsia. Does not bruise/bleed easily.  Psychiatric/Behavioral: Negative for depression, suicidal ideas, hallucinations, memory loss and substance abuse. The patient is not nervous/anxious and does not have insomnia.        Objective:  Blood pressure 121/81, pulse 91, height 5\' 1"  (1.549 m), weight 196 lb (88.9 kg).   Physical Exam  Vitals reviewed. Constitutional: She is oriented to person, place, and time. She appears well-developed and well-nourished.  HENT:  Head: Normocephalic and atraumatic.        Right Ear: External ear normal.  Left Ear: External ear normal.  Nose: Nose normal.  Mouth/Throat: Oropharynx is clear and moist.  Eyes: Conjunctivae and EOM are normal. Pupils are equal, round, and reactive to light. Right eye exhibits no discharge. Left eye exhibits no discharge. No  scleral icterus.  Neck: Normal range of motion. Neck supple. No tracheal deviation present. No thyromegaly present.  Cardiovascular: Normal rate, regular rhythm, normal heart sounds and intact distal pulses.  Exam reveals no gallop and no friction rub.   No murmur heard. Respiratory: Effort normal and breath sounds normal. No respiratory distress. She has no wheezes. She has no rales. She exhibits no tenderness.  GI: Soft. Bowel sounds are normal. She exhibits no distension and no mass. There is no tenderness. There is no rebound and no guarding.  Genitourinary:  Breasts no masses skin changes or nipple changes bilaterally      Vulva is normal without lesions Vagina is pink moist without discharge Cervix normal in appearance and pap is done Uterus is normal size shape and contour Adnexa is negative with normal sized ovaries   Musculoskeletal: Normal range of motion. She  exhibits no edema and no tenderness.  Neurological: She is alert and oriented to person, place, and time. She has normal reflexes. She displays normal reflexes. No cranial nerve deficit. She exhibits normal muscle tone. Coordination normal.  Skin: Skin is warm and dry. No rash noted. No erythema. No pallor.  Psychiatric: She has a normal mood and affect. Her behavior is normal. Judgment and thought content normal.       Medications Ordered at today's visit: No orders of the defined types were placed in this encounter.   Other orders placed at today's visit: No orders of the defined types were placed in this encounter.     Assessment:    Normal Gyn exam.    Plan:    Contraception: IUD. Follow up in: 3 weeks. IUD is due for removal, wants it replaced     Return in about 3 weeks (around 08/07/2019) for IUD removal and replacement with Manus Gunning.

## 2019-07-20 LAB — CYTOLOGY - PAP
Comment: NEGATIVE
Diagnosis: NEGATIVE
High risk HPV: NEGATIVE

## 2019-07-31 ENCOUNTER — Ambulatory Visit: Payer: Medicaid Other

## 2019-07-31 DIAGNOSIS — Z23 Encounter for immunization: Secondary | ICD-10-CM

## 2019-07-31 NOTE — Progress Notes (Signed)
   Covid-19 Vaccination Clinic  Name:  Angelica Lopez    MRN: [id number] DOB: 1980-05-30  07/31/2019  Ms. Kavanagh was observed post Covid-19 immunization for 30 minutes based on pre-vaccination screening without incident. She was provided with Vaccine Information Sheet and instruction to access the V-Safe system.   Ms. Hilyer was instructed to call 911 with any severe reactions post vaccine: Marland Kitchen Difficulty breathing  . Swelling of face and throat  . A fast heartbeat  . A bad rash all over body  . Dizziness and weakness   Immunizations Administered    Name Date Dose VIS Date Route   Moderna COVID-19 Vaccine 07/31/2019  3:12 PM 0.5 mL 01/2019 Intramuscular   Manufacturer: Moderna   Lot: [id number]   [place]: [id number]

## 2019-08-06 ENCOUNTER — Encounter (HOSPITAL_COMMUNITY): Payer: Self-pay

## 2019-08-06 ENCOUNTER — Other Ambulatory Visit: Payer: Self-pay

## 2019-08-06 ENCOUNTER — Ambulatory Visit
Admission: EM | Admit: 2019-08-06 | Discharge: 2019-08-06 | Disposition: A | Discharge status: Home / Self Care | Payer: Medicaid Other

## 2019-08-06 ENCOUNTER — Emergency Department (HOSPITAL_COMMUNITY)
Admission: EM | Admit: 2019-08-06 | Discharge: 2019-08-06 | Disposition: A | Discharge status: Home / Self Care | Payer: Medicaid Other | Attending: Emergency Medicine | Admitting: Emergency Medicine

## 2019-08-06 DIAGNOSIS — R251 Tremor, unspecified: Secondary | ICD-10-CM | POA: Diagnosis present

## 2019-08-06 DIAGNOSIS — Z5321 Procedure and treatment not carried out due to patient leaving prior to being seen by health care provider: Secondary | ICD-10-CM | POA: Insufficient documentation

## 2019-08-06 NOTE — ED Triage Notes (Signed)
Pt to er, pt states that she has been under a lot of stress, states that she has had several deaths in the family and had an episode of jerking movements.  States that she saw her pmd on video and got a medication for her nerves.  States that she was seen at urgent care and they sent her here.  States that they checked her sugar.  Pt states that she is here for some jerking movements and shaking.

## 2019-08-06 NOTE — ED Triage Notes (Signed)
Pt has equal strength bilaterally, equal sensation to touch, no facial droop noted,

## 2019-08-06 NOTE — ED Triage Notes (Signed)
Patient is being discharged from the Urgent Care and sent to the Emergency Department via private vehicle . Per B Wurst , patient is in need of higher level of care  Patient is aware and verbalizes understanding of plan of care.  Vitals:   08/06/19 1201  BP: 132/73  Pulse: 99  Resp: 18  Temp: 98.7 F (37.1 C)

## 2019-08-07 ENCOUNTER — Ambulatory Visit: Payer: Medicaid Other | Admitting: Advanced Practice Midwife

## 2019-08-07 ENCOUNTER — Emergency Department (HOSPITAL_COMMUNITY)
Admission: EM | Admit: 2019-08-07 | Discharge: 2019-08-07 | Disposition: A | Discharge status: Home / Self Care | Payer: Medicaid Other | Attending: Emergency Medicine | Admitting: Emergency Medicine

## 2019-08-07 ENCOUNTER — Encounter (HOSPITAL_COMMUNITY): Payer: Self-pay | Admitting: *Deleted

## 2019-08-07 ENCOUNTER — Other Ambulatory Visit: Payer: Self-pay

## 2019-08-07 DIAGNOSIS — R251 Tremor, unspecified: Secondary | ICD-10-CM | POA: Insufficient documentation

## 2019-08-07 DIAGNOSIS — F17210 Nicotine dependence, cigarettes, uncomplicated: Secondary | ICD-10-CM | POA: Insufficient documentation

## 2019-08-07 DIAGNOSIS — J45909 Unspecified asthma, uncomplicated: Secondary | ICD-10-CM | POA: Insufficient documentation

## 2019-08-07 DIAGNOSIS — I10 Essential (primary) hypertension: Secondary | ICD-10-CM | POA: Diagnosis not present

## 2019-08-07 DIAGNOSIS — Z79899 Other long term (current) drug therapy: Secondary | ICD-10-CM | POA: Diagnosis not present

## 2019-08-07 DIAGNOSIS — G252 Other specified forms of tremor: Secondary | ICD-10-CM

## 2019-08-07 LAB — URINALYSIS, ROUTINE W REFLEX MICROSCOPIC
Bacteria, UA: NONE SEEN
Bilirubin Urine: NEGATIVE
Glucose, UA: NEGATIVE mg/dL
Ketones, ur: NEGATIVE mg/dL
Nitrite: NEGATIVE
Protein, ur: 30 mg/dL — AB
RBC / HPF: >50 RBC/hpf — ABNORMAL HIGH (ref 0–5)
Specific Gravity, Urine: 1.023 (ref 1.005–1.030)
pH: 5 (ref 5.0–8.0)

## 2019-08-07 LAB — RAPID URINE DRUG SCREEN, HOSP PERFORMED
Amphetamines: NOT DETECTED
Barbiturates: NOT DETECTED
Benzodiazepines: POSITIVE — AB
Cocaine: NOT DETECTED
Opiates: NOT DETECTED
Tetrahydrocannabinol: POSITIVE — AB

## 2019-08-07 LAB — CBC
HCT: 43.4 % (ref 36.0–46.0)
Hemoglobin: 14.3 g/dL (ref 12.0–15.0)
MCH: 30.4 pg (ref 26.0–34.0)
MCHC: 32.9 g/dL (ref 30.0–36.0)
MCV: 92.3 fL (ref 80.0–100.0)
Platelets: 237 10*3/uL (ref 150–400)
RBC: 4.7 MIL/uL (ref 3.87–5.11)
RDW: 12.5 % (ref 11.5–15.5)
WBC: 9.1 10*3/uL (ref 4.0–10.5)
nRBC: 0 % (ref 0.0–0.2)

## 2019-08-07 LAB — COMPREHENSIVE METABOLIC PANEL
ALT: 47 U/L — ABNORMAL HIGH (ref 0–44)
AST: 35 U/L (ref 15–41)
Albumin: 4.3 g/dL (ref 3.5–5.0)
Alkaline Phosphatase: 66 U/L (ref 38–126)
Anion gap: 11 (ref 5–15)
BUN: 16 mg/dL (ref 6–20)
CO2: 26 mmol/L (ref 22–32)
Calcium: 9.5 mg/dL (ref 8.9–10.3)
Chloride: 98 mmol/L (ref 98–111)
Creatinine, Ser: 0.59 mg/dL (ref 0.44–1.00)
GFR calc Af Amer: >60 mL/min (ref >60)
GFR calc non Af Amer: >60 mL/min (ref >60)
Glucose, Bld: 107 mg/dL — ABNORMAL HIGH (ref 70–99)
Potassium: 3.9 mmol/L (ref 3.5–5.1)
Sodium: 135 mmol/L (ref 135–145)
Total Bilirubin: 0.7 mg/dL (ref 0.3–1.2)
Total Protein: 8.2 g/dL — ABNORMAL HIGH (ref 6.5–8.1)

## 2019-08-07 LAB — ETHANOL: Alcohol, Ethyl (B): <10 mg/dL (ref <10)

## 2019-08-07 LAB — CBG MONITORING, ED: Glucose-Capillary: 114 mg/dL — ABNORMAL HIGH (ref 70–99)

## 2019-08-07 MED ORDER — CLONAZEPAM 0.5 MG PO TABS: 0.5000 mg | ORAL_TABLET | Freq: Two times a day (BID) | ORAL | 0 refills | Status: DC | PRN
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
## 2019-08-07 NOTE — ED Triage Notes (Signed)
Pt states she has uncontrollable tremors to her legs; pt was seen here yesterday but had to leave because she was too anxious due to patients in the waiting room being too loud; pt states she has been under a lot of stress and is not sure if this is a contributing factor to the tremors; pt denies any new medications or change in medications

## 2019-08-07 NOTE — ED Notes (Signed)
PT came to the desk and stated. "If I can't see the doctor soon I am going to leave." The PA Verdis Frederickson was called at this time and she will follow up with the patient as soon as possible. Pt seemed okay with this solution and returned to her room at this time.

## 2019-08-07 NOTE — Discharge Instructions (Addendum)
Please make sure to follow-up with your psychiatrist about the symptoms you have been experiencing today.  Return to the ER if your symptoms worsen.

## 2019-08-11 ENCOUNTER — Encounter (HOSPITAL_COMMUNITY): Payer: Self-pay | Admitting: Psychiatry

## 2019-08-11 ENCOUNTER — Other Ambulatory Visit: Payer: Self-pay

## 2019-08-11 ENCOUNTER — Telehealth (INDEPENDENT_AMBULATORY_CARE_PROVIDER_SITE_OTHER): Payer: Medicaid Other | Admitting: Psychiatry

## 2019-08-11 DIAGNOSIS — F330 Major depressive disorder, recurrent, mild: Secondary | ICD-10-CM | POA: Diagnosis not present

## 2019-08-11 DIAGNOSIS — F1410 Cocaine abuse, uncomplicated: Secondary | ICD-10-CM

## 2019-08-11 DIAGNOSIS — F411 Generalized anxiety disorder: Secondary | ICD-10-CM | POA: Diagnosis not present

## 2019-08-11 DIAGNOSIS — F1121 Opioid dependence, in remission: Secondary | ICD-10-CM

## 2019-08-11 MED ORDER — CLONAZEPAM 0.5 MG PO TABS
0.5000 mg | ORAL_TABLET | Freq: Every day | ORAL | 1 refills | Status: DC | PRN
Start: 2019-08-11 — End: 2019-10-06

## 2019-08-11 MED ORDER — SERTRALINE HCL 50 MG PO TABS: ORAL_TABLET | ORAL | 1 refills | Status: DC

## 2019-08-11 NOTE — Patient Instructions (Signed)
1. Decrease Paxil 25 mg daily for one week, then discontinue 2. Start sertraline 25 mg daily for one week, then 50 mg daily  3. Reduce clonazepam 0.5 mg daily as needed, 15 tabs per month 4. Next appointment- 8/13 at 10:40

## 2019-08-11 NOTE — Progress Notes (Signed)
Virtual Visit via Video Note  I connected with Angelica Lopez on 08/11/19 at  8:30 AM EDT by a video enabled telemedicine application and verified that I am speaking with the correct person using two identifiers.   I discussed the limitations of evaluation and management by telemedicine and the availability of in person appointments. The patient expressed understanding and agreed to proceed.     I discussed the assessment and treatment plan with the patient. The patient was provided an opportunity to ask questions and all were answered. The patient agreed with the plan and demonstrated an understanding of the instructions.   The patient was advised to call back or seek an in-person evaluation if the symptoms worsen or if the condition fails to improve as anticipated.  Location: patient- home, provider- home office   I provided 25 minutes of non-face-to-face time during this encounter.   Angelica Clay, MD     [facility] MD/PA/NP OP Progress Note  08/11/2019 9:03 AM [place]  MRN:  [id number]  Chief Complaint:  Chief Complaint    Depression; Follow-up; Anxiety     HPI:  This is a follow-up appointment for depression, anxiety and substance use.  She was last seen in 2019.  - per chart review, she went to ED for body tremors; she was prescribed clonazepam.  She states that she has not been doing good.  She notes her mother from heart attack in April.  She was very close with her mother.  She also talks about loss of her grandparents last year.  She has been having body tremors, which she attributes to stress.  She went to ED for these symptoms, and was prescribed clonazepam, which significantly helped the patient.  The chart review supports this. She states that she cannot function at all due to tremors. She states that she cannot even walk due to leg tremors.  She takes good care of her 32 year old daughter. She reports conflict with her oldest daughter, who temporality moved in due to  her roommate starting to live with the boyfriend. She also reports stress due to her PCP provider, who she has seen for more than an year will be leaving the clinic.  She states that her father is out of her picture; he has been in and out of jail.   She has insomnia.  She feels depressed.  She has anhedonia.  She has difficulty in concentration.  She denies SI.  She feels anxious and tense.  She has had panic attacks.    Substance- she goes to [facility] every month. She started to abuse opioid since being prescribed of opioid after c-section of her 59 year old daughter. Her suboxone was tapered off by Dr. Merlene Laughter, and she abused cocaine. She went to [facility], and has been abstinent from cocaine, opioids. She denies alcohol use. She uses marijuana daily "to relax" for years; she is motivated to be abstinent from it.  birth of her 38 year old.   Medication- Paroxetine 50 mg daily. She has been on this for several years. Abilify tried one day, on Clonidine for anxiety  Daily routine: takes care of her children Employment: unemployed Support: neighbor Household:  23 year old daughter, and oldest daughter who is with her briefly Marital status:  Divorced in 2016, used to be married for 15 years Number of children: 59, - 15 yo, 48 yo, middle one is at [employer]  Psychiatry admission: 120 days rehab of opioid use  disorder Previous suicide attempt: denies  Past trials of medication: Wellbutrin, Buspar, hydroxyzine, quetiapine/drowsiness, diazepam   Visit Diagnosis:    ICD-10-CM   1. GAD (generalized anxiety disorder)  F41.1   2. MDD (major depressive disorder), recurrent episode, mild (HCC)  F33.0   3. Opioid dependence in remission ([place])  F11.21   4. Cocaine abuse ([place])  F14.10     Past Psychiatric History: Please see initial evaluation for full details. I have reviewed the history. No updates at this time.     Past Medical History:  Past Medical History:   Diagnosis Date  . Hypertension   . Kyphosis   . Scheuermann's disease     Past Surgical History:  Procedure Laterality Date  . CESAREAN SECTION    . TONSILLECTOMY      Family Psychiatric History: Please see initial evaluation for full details. I have reviewed the history. No updates at this time.     Family History:  Family History  Problem Relation Age of Onset  . Anxiety disorder Mother   . Depression Mother   . Hypertension Father   . Diabetes Father   . Anxiety disorder Father   . Depression Father   . Heart disease Maternal Grandfather        has had 3 open heart surgeries  . Febrile seizures Daughter   . Febrile seizures Daughter   . Febrile seizures Daughter     Social History:  Social History   Socioeconomic History  . Marital status: Legally Separated    Spouse name: Not on file  . Number of children: 3  . Years of education: Not on file  . Highest education level: Not on file  Occupational History  . Not on file  Tobacco Use  . Smoking status: Current Every Day Smoker    Packs/day: 1.00    Years: 15.00    Pack years: 15.00    Types: Cigarettes  . Smokeless tobacco: Never Used  Vaping Use  . Vaping Use: Never used  Substance and Sexual Activity  . Alcohol use: No  . Drug use: Yes    Types: Marijuana    Comment: twice a week  . Sexual activity: Not Currently    Birth control/protection: I.U.D.  Other Topics Concern  . Not on file  Social History Narrative  . Not on file   Social Determinants of Health   Financial Resource Strain: High Risk  . Difficulty of Paying Living Expenses: Hard  Food Insecurity: Food Insecurity Present  . Worried About Charity fundraiser in the Last Year: Sometimes true  . Ran Out of Food in the Last Year: Sometimes true  Transportation Needs: No Transportation Needs  . Lack of Transportation (Medical): No  . Lack of Transportation (Non-Medical): No  Physical Activity: Inactive  . Days of Exercise per Week: 0  days  . Minutes of Exercise per Session: 0 min  Stress: Stress Concern Present  . Feeling of Stress : Very much  Social Connections: Moderately Integrated  . Frequency of Communication with Friends and Family: More than three times a week  . Frequency of Social Gatherings with Friends and Family: More than three times a week  . Attends Religious Services: 1 to 4 times per year  . Active Member of Clubs or Organizations: No  . Attends Archivist Meetings: 1 to 4 times per year  . Marital Status: Divorced    Allergies:  Allergies  Allergen Reactions  . Codeine Anaphylaxis  Reaction:Tongue swells    Metabolic Disorder Labs: No results found for: HGBA1C, MPG No results found for: PROLACTIN No results found for: CHOL, TRIG, HDL, CHOLHDL, VLDL, LDLCALC Lab Results  Component Value Date   TSH 1.81 12/06/2017   TSH 1.422 05/29/2009    Therapeutic Level Labs: No results found for: LITHIUM No results found for: VALPROATE No components found for:  CBMZ  Current Medications: Current Outpatient Medications  Medication Sig Dispense Refill  . Buprenorphine HCl-Naloxone HCl (SUBOXONE) 8-2 MG FILM Place 3 tablets under the tongue daily.    . cetirizine (ZYRTEC) 10 MG tablet Take 10 mg by mouth daily.    . cholecalciferol (VITAMIN D) 1000 UNITS tablet Take 1,000 Units by mouth daily. Take 2 tabs daily    . clonazePAM (KLONOPIN) 0.5 MG tablet Take 1 tablet (0.5 mg total) by mouth 2 (two) times daily as needed for up to 3 days for anxiety. 6 tablet 0  . clonazePAM (KLONOPIN) 0.5 MG tablet Take 1 tablet (0.5 mg total) by mouth daily as needed for anxiety (15 tabs per month). 15 tablet 1  . cloNIDine (CATAPRES) 0.1 MG tablet Take 0.1 mg by mouth daily.    . cyclobenzaprine (FLEXERIL) 10 MG tablet Take 10 mg by mouth 3 (three) times daily as needed.    Marland Kitchen ibuprofen (ADVIL,MOTRIN) 200 MG tablet Take 400 mg by mouth every 6 (six) hours as needed for headache or mild pain.     Marland Kitchen  levonorgestrel (MIRENA) 20 MCG/24HR IUD 1 each by Intrauterine route once.    Marland Kitchen lisinopril (ZESTRIL) 20 MG tablet Take 20 mg by mouth daily.    Marland Kitchen PARoxetine (PAXIL-CR) 25 MG 24 hr tablet Take 50 mg by mouth every morning.    . sertraline (ZOLOFT) 50 MG tablet 25 mg daily for one week, then 50 mg daily 30 tablet 1   No current facility-administered medications for this visit.     Musculoskeletal: Strength & Muscle Tone: N/A Gait & Station: N/A Patient leans: N/A  Psychiatric Specialty Exam: Review of Systems  Psychiatric/Behavioral: Positive for decreased concentration, dysphoric mood and sleep disturbance. Negative for agitation, behavioral problems, confusion, hallucinations, self-injury and suicidal ideas. The patient is nervous/anxious. The patient is not hyperactive.   All other systems reviewed and are negative.   There were no vitals taken for this visit.There is no height or weight on file to calculate BMI.  General Appearance: Fairly Groomed  Eye Contact:  Good  Speech:  Clear and Coherent  Volume:  Normal  Mood:  Anxious and Depressed  Affect:  Appropriate, Congruent and Tearful  Thought Process:  Coherent  Orientation:  Full (Time, Place, and Person)  Thought Content: Logical   Suicidal Thoughts:  No  Homicidal Thoughts:  No  Memory:  Immediate;   Good  Judgement:  Good  Insight:  Present  Psychomotor Activity:  Normal  Concentration:  Concentration: Good and Attention Span: Good  Recall:  Good  Fund of Knowledge: Good  Language: Good  Akathisia:  No  Handed:  Right  AIMS (if indicated): not done  Assets:  Communication Skills Desire for Improvement  AL's:  Intact  Cognition: WNL  Sleep:  Poor   Screenings: GAD-7     Office Visit from 07/17/2019 in [place]  Total GAD-7 Score 6    PHQ2-9     Office Visit from 07/17/2019 in [place] Office Visit from 01/10/2018 in [facility]  PHQ-2 Total Score 2 0  PHQ-9 Total Score 8 --        Assessment and Plan:  Angelica Lopez is a 39 y.o. year old female with a history of depression, anxiety, opioid use disorder in sustained remission on Suboxone, who presents for follow up appointment for below.   1. GAD (generalized anxiety disorder) 2. MDD (major depressive disorder), recurrent episode, mild ([place]) She reports significant worsening in anxiety and depressive symptoms in the context of loss of her family members.  She complains of body tremors, which she attributes to anxiety as well.  She has not taken antipsychotic for several months except 1 time dose of Abilify, which is less likely to cause EPS.  Will switch from Paxil to sertraline to optimize treatment for anxiety and depression.  Discussed potential risk of serotonin syndrome and discontinuation symptoms.  Will have Klonopin at lower dose for short-term use only for anxiety as harm reduction.  Discussed risk of dependence and oversedation especially with concomitant use of Suboxone.  She agrees that this medication will not be continued if there is any signs of misuse of this medication/substances.  She will greatly benefit from CBT/supportive therapy; will make referral.   3. Opioid dependence in remission [facility]) She is on Suboxone, prescribed by PCP.  Will continue motivational interview.   4. Cocaine abuse ([place]) # Marijuana use She has been abstinent from cocaine use at least for several months, and is motivated for sobriety from marijuana use.  Will continue motivational interviewing.   Plan 1. Decrease Paxil 25 mg daily for one week, then discontinue 2. Start sertraline 25 mg daily for one week, then 50 mg daily  3. Reduce clonazepam 0.5 mg daily as needed, 15 tabs per month 4. Next appointment- 8/13 at 10:40 for 30 mins, video - Referral to therapy  - Discussed attendance policy - she is on clonidine, prescribed by PCP  I have utilized the [place] Controlled Substances Reporting System (PMP AWARxE) to confirm  adherence regarding the patient's medication. My review reveals appropriate prescription fills.   The patient demonstrates the following risk factors for suicide: Chronic risk factors for suicide include: psychiatric disorder of depression, anxiety and substance use disorder. Acute risk factors for suicide include: family or marital conflict and unemployment. Protective factors for this patient include: responsibility to others (children, family), coping skills and hope for the future. Considering these factors, the overall suicide risk at this point appears to be low. Patient is appropriate for outpatient follow up.  Angelica Clay, MD 08/11/2019, 9:03 AM

## 2019-08-17 ENCOUNTER — Ambulatory Visit
Admission: EM | Admit: 2019-08-17 | Discharge: 2019-08-17 | Disposition: A | Discharge status: Home / Self Care | Payer: Medicaid Other | Attending: Emergency Medicine | Admitting: Emergency Medicine

## 2019-08-17 ENCOUNTER — Ambulatory Visit (INDEPENDENT_AMBULATORY_CARE_PROVIDER_SITE_OTHER): Payer: Medicaid Other

## 2019-08-17 ENCOUNTER — Encounter: Payer: Self-pay | Admitting: Emergency Medicine

## 2019-08-17 DIAGNOSIS — M25572 Pain in left ankle and joints of left foot: Secondary | ICD-10-CM

## 2019-08-17 DIAGNOSIS — M79672 Pain in left foot: Secondary | ICD-10-CM

## 2019-08-17 MED ORDER — IBUPROFEN 800 MG PO TABS
800.0000 mg | ORAL_TABLET | Freq: Three times a day (TID) | ORAL | 0 refills | Status: DC
Start: 2019-08-17 — End: 2022-08-18

## 2019-08-17 NOTE — ED Provider Notes (Signed)
[facility]   [id number] 08/17/19 Arrival Time: 5176   Chief Complaint  Patient presents with   Toe Pain     SUBJECTIVE: History from: patient.  Angelica Lopez is a 39 y.o. female  who presented to the urgent care with a complaint of pain to left fourth toe for the past 4 days.  Reports she was in a river, was walking on a rock and had multiple falls.  She localizes the pain to the left fourth toe.  She describes the pain as constant and achy.  She has tried OTC medications without relief.  Her symptoms are made worse with ROM.  She denies similar symptoms in the past.  Denies chills, fever, nausea, vomiting, diarrhea.     ROS: As per HPI.  All other pertinent ROS negative.      Past Medical History:  Diagnosis Date   Hypertension    Kyphosis    Scheuermann's disease    Past Surgical History:  Procedure Laterality Date   CESAREAN SECTION     TONSILLECTOMY     Allergies  Allergen Reactions   Codeine Anaphylaxis    Reaction:Tongue swells   No current facility-administered medications on file prior to encounter.   Current Outpatient Medications on File Prior to Encounter  Medication Sig Dispense Refill   Buprenorphine HCl-Naloxone HCl (SUBOXONE) 8-2 MG FILM Place 3 tablets under the tongue daily.     cetirizine (ZYRTEC) 10 MG tablet Take 10 mg by mouth daily.     cholecalciferol (VITAMIN D) 1000 UNITS tablet Take 1,000 Units by mouth daily. Take 2 tabs daily     clonazePAM (KLONOPIN) 0.5 MG tablet Take 1 tablet (0.5 mg total) by mouth 2 (two) times daily as needed for up to 3 days for anxiety. 6 tablet 0   clonazePAM (KLONOPIN) 0.5 MG tablet Take 1 tablet (0.5 mg total) by mouth daily as needed for anxiety (15 tabs per month). 15 tablet 1   cloNIDine (CATAPRES) 0.1 MG tablet Take 0.1 mg by mouth daily.     cyclobenzaprine (FLEXERIL) 10 MG tablet Take 10 mg by mouth 3 (three) times daily as needed.     levonorgestrel (MIRENA) 20 MCG/24HR IUD 1  each by Intrauterine route once.     lisinopril (ZESTRIL) 20 MG tablet Take 20 mg by mouth daily.     PARoxetine (PAXIL-CR) 25 MG 24 hr tablet Take 50 mg by mouth every morning.     sertraline (ZOLOFT) 50 MG tablet 25 mg daily for one week, then 50 mg daily 30 tablet 1   Social History   Socioeconomic History   Marital status: Legally Separated    Spouse name: Not on file   Number of children: 3   Years of education: Not on file   Highest education level: Not on file  Occupational History   Not on file  Tobacco Use   Smoking status: Current Every Day Smoker    Packs/day: 1.00    Years: 15.00    Pack years: 15.00    Types: Cigarettes   Smokeless tobacco: Never Used  Scientific laboratory technician Use: Never used  Substance and Sexual Activity   Alcohol use: No   Drug use: Yes    Types: Marijuana    Comment: twice a week   Sexual activity: Not Currently    Birth control/protection: I.U.D.  Other Topics Concern   Not on file  Social History Narrative   Not on file   Social Determinants  of Health   Financial Resource Strain: High Risk   Difficulty of Paying Living Expenses: Hard  Food Insecurity: Food Insecurity Present   Worried About Running Out of Food in the Last Year: Sometimes true   Ran Out of Food in the Last Year: Sometimes true  Transportation Needs: No Transportation Needs   Lack of Transportation (Medical): No   Lack of Transportation (Non-Medical): No  Physical Activity: Inactive   Days of Exercise per Week: 0 days   Minutes of Exercise per Session: 0 min  Stress: Stress Concern Present   Feeling of Stress : Very much  Social Connections: Moderately Integrated   Frequency of Communication with Friends and Family: More than three times a week   Frequency of Social Gatherings with Friends and Family: More than three times a week   Attends Religious Services: 1 to 4 times per year   Active Member of [employer] or Organizations: No   Attends  Music therapist: 1 to 4 times per year   Marital Status: Divorced  Human resources officer Violence: Not At Risk   Fear of Current or Ex-Partner: No   Emotionally Abused: No   Physically Abused: No   Sexually Abused: No   Family History  Problem Relation Age of Onset   Anxiety disorder Mother    Depression Mother    Hypertension Father    Diabetes Father    Anxiety disorder Father    Depression Father    Heart disease Maternal Grandfather        has had 3 open heart surgeries   Febrile seizures Daughter    Febrile seizures Daughter    Febrile seizures Daughter     OBJECTIVE:  Vitals:   08/17/19 1254 08/17/19 1255  BP:  109/75  Pulse:  77  Resp:  17  Temp:  98.2 F (36.8 C)  TempSrc:  Oral  SpO2:  95%  Weight: 195 lb (88.5 kg)   Height: 5\' 1"  (1.549 m)      Physical Exam Vitals and nursing note reviewed.  Constitutional:      General: She is not in acute distress.    Appearance: Normal appearance. She is normal weight. She is not ill-appearing, toxic-appearing or diaphoretic.  Cardiovascular:     Rate and Rhythm: Normal rate and regular rhythm.     Pulses: Normal pulses.     Heart sounds: Normal heart sounds. No murmur heard.  No friction rub. No gallop.   Pulmonary:     Effort: Pulmonary effort is normal. No respiratory distress.     Breath sounds: Normal breath sounds. No stridor. No wheezing, rhonchi or rales.  Chest:     Chest wall: No tenderness.  Musculoskeletal:        General: Tenderness present.     Right foot: Normal.     Left foot: Tenderness present.     Comments: Patient is able to bear weight and ambulate with mild pain.  The left foot is without any obvious asymmetry or deformity when compared to the right foot.  Bruising and ecchymosis present.  No erythema, open wound, or warmth present.  Limited range of motion due to pain.  Neurovascular status intact.  Neurological:     Mental Status: She is alert.       LABS:  No results found for this or any previous visit (from the past 24 hour(s)).   ASSESSMENT & PLAN:  1. Toe joint pain, left     Meds ordered this encounter  Medications   ibuprofen (ADVIL) 800 MG tablet    Sig: Take 1 tablet (800 mg total) by mouth 3 (three) times daily.    Dispense:  21 tablet    Refill:  0   Patient is stable at discharge.  Left foot x-ray is negative for acute  bony abnormality including fracture or dislocation.  I have reviewed the x-ray myself and the radiologist interpretation.  I am in agreement with the radiologist interpretation.   Discharge instructions Take ibuprofen as prescribed/take it with food Follow RICE instruction that is attached Follow-up with PCP Return or go to ED for worsening of symptoms   Reviewed expectations re: course of current medical issues. Questions answered. Outlined signs and symptoms indicating need for more acute intervention. Patient verbalized understanding. After Visit Summary given.      Note: This document was prepared using Dragon voice recognition software and may include unintentional dictation errors.    Emerson Monte, [place] 08/17/19 1347

## 2019-08-17 NOTE — ED Triage Notes (Signed)
Pain ,swelling and redness to LT 4th toe since Sunday  After walking on rocks in the river

## 2019-08-17 NOTE — Discharge Instructions (Addendum)
Take ibuprofen as prescribed/take it with food Follow RICE instruction that is attached Follow-up with PCP Return or go to ED for worsening of symptoms

## 2019-08-25 ENCOUNTER — Ambulatory Visit
Admission: EM | Admit: 2019-08-25 | Discharge: 2019-08-25 | Discharge status: Absent without leave | Payer: Medicaid Other

## 2019-08-25 ENCOUNTER — Other Ambulatory Visit: Payer: Self-pay

## 2019-08-28 ENCOUNTER — Ambulatory Visit: Payer: Medicaid Other | Admitting: Women's Health

## 2019-08-29 NOTE — Progress Notes (Signed)
Virtual Visit via Video Note  I connected with Angelica Lopez on 09/01/19 at  9:00 AM EDT by a video enabled telemedicine application and verified that I am speaking with the correct person using two identifiers.   I discussed the limitations of evaluation and management by telemedicine and the availability of in person appointments. The patient expressed understanding and agreed to proceed.    I discussed the assessment and treatment plan with the patient. The patient was provided an opportunity to ask questions and all were answered. The patient agreed with the plan and demonstrated an understanding of the instructions.   The patient was advised to call back or seek an in-person evaluation if the symptoms worsen or if the condition fails to improve as anticipated.  Location: patient- home, provider- home office   I provided 20 minutes of non-face-to-face time during this encounter.   [name], MD    [facility] MD/PA/NP OP Progress Note  09/01/2019 9:41 AM [place]  MRN:  [id number]  Chief Complaint:  Chief Complaint    Depression; Follow-up; Other     HPI:  This is a follow-up appointment for depression, anxiety and substance use.  She states that she feels much better since the last visit.  Clonazepam helps tremendously for tremors.  She has been trying to take it only when she has significant anxiety.  She has been able to take a walk or go up and down stairs, which she was not able to before starting clonazepam due to perceived tremors.  She reports great relationship with her 77 year old daughter.  Although she used to have argument with her oldest daughter, it has been better since her daughter moved in.  She misses her mother, who deceased in 27-Jun-2019.  She was very close with her mother. She also states that she lost her grandparents and aunt and before her death.  She was able to put her mothers ashes in ocean, stating that her mother always wanted to go to [employer].  She talks about her father, who was arrested last month, and was rearrested this month due to trespassing and made some threats.  She sleeps better.  She has not taken any quetiapine due to its side effect of drowsiness.  She has decreased appetite.  She has fair concentration.  She denies feeling depressed.  She has fair motivation and energy.  She denies SI.  She feels less anxious, tense.  She feels less irritable.  She denies panic attacks.  She has been taking Paxil 25 mg daily and sertraline 25 mg daily as she was nervous to be off Paxil as she has been on this medication for many years.   Daily routine: takes care of her children Employment: unemployed Support: neighbor Household:  88 year old daughter, and oldest daughter who is with her briefly Marital status:  Divorced in 2016, used to be married for 15 years Number of children: 21, - 34 yo, 59 yo, middle one is at [employer] state  Visit Diagnosis:    ICD-10-CM   1. GAD (generalized anxiety disorder)  F41.1   2. MDD (major depressive disorder), recurrent, in partial remission ([place])  F33.41   3. Cocaine abuse ([place])  F14.10     Past Psychiatric History: Please see initial evaluation for full details. I have reviewed the history. No updates at this time.     Past Medical History:  Past Medical History:  Diagnosis Date  . Hypertension   . Kyphosis   .  Scheuermann's disease     Past Surgical History:  Procedure Laterality Date  . CESAREAN SECTION    . TONSILLECTOMY      Family Psychiatric History: Please see initial evaluation for full details. I have reviewed the history. No updates at this time.     Family History:  Family History  Problem Relation Age of Onset  . Anxiety disorder Mother   . Depression Mother   . Hypertension Father   . Diabetes Father   . Anxiety disorder Father   . Depression Father   . Heart disease Maternal Grandfather        has had 3 open heart surgeries  . Febrile seizures  Daughter   . Febrile seizures Daughter   . Febrile seizures Daughter     Social History:  Social History   Socioeconomic History  . Marital status: Legally Separated    Spouse name: Not on file  . Number of children: 3  . Years of education: Not on file  . Highest education level: Not on file  Occupational History  . Not on file  Tobacco Use  . Smoking status: Current Every Day Smoker    Packs/day: 1.00    Years: 15.00    Pack years: 15.00    Types: Cigarettes  . Smokeless tobacco: Never Used  Vaping Use  . Vaping Use: Never used  Substance and Sexual Activity  . Alcohol use: No  . Drug use: Yes    Types: Marijuana    Comment: twice a week  . Sexual activity: Not Currently    Birth control/protection: I.U.D.  Other Topics Concern  . Not on file  Social History Narrative  . Not on file   Social Determinants of Health   Financial Resource Strain: High Risk  . Difficulty of Paying Living Expenses: Hard  Food Insecurity: Food Insecurity Present  . Worried About Charity fundraiser in the Last Year: Sometimes true  . Ran Out of Food in the Last Year: Sometimes true  Transportation Needs: No Transportation Needs  . Lack of Transportation (Medical): No  . Lack of Transportation (Non-Medical): No  Physical Activity: Inactive  . Days of Exercise per Week: 0 days  . Minutes of Exercise per Session: 0 min  Stress: Stress Concern Present  . Feeling of Stress : Very much  Social Connections: Moderately Integrated  . Frequency of Communication with Friends and Family: More than three times a week  . Frequency of Social Gatherings with Friends and Family: More than three times a week  . Attends Religious Services: 1 to 4 times per year  . Active Member of Clubs or Organizations: No  . Attends Archivist Meetings: 1 to 4 times per year  . Marital Status: Divorced    Allergies:  Allergies  Allergen Reactions  . Codeine Anaphylaxis    Reaction:Tongue swells     Metabolic Disorder Labs: No results found for: HGBA1C, MPG No results found for: PROLACTIN No results found for: CHOL, TRIG, HDL, CHOLHDL, VLDL, LDLCALC Lab Results  Component Value Date   TSH 1.81 12/06/2017   TSH 1.422 05/29/2009    Therapeutic Level Labs: No results found for: LITHIUM No results found for: VALPROATE No components found for:  CBMZ  Current Medications: Current Outpatient Medications  Medication Sig Dispense Refill  . Buprenorphine HCl-Naloxone HCl (SUBOXONE) 8-2 MG FILM Place 3 tablets under the tongue daily.    . cetirizine (ZYRTEC) 10 MG tablet Take 10 mg by mouth daily.    Marland Kitchen  cholecalciferol (VITAMIN D) 1000 UNITS tablet Take 1,000 Units by mouth daily. Take 2 tabs daily    . clonazePAM (KLONOPIN) 0.5 MG tablet Take 1 tablet (0.5 mg total) by mouth 2 (two) times daily as needed for up to 3 days for anxiety. 6 tablet 0  . clonazePAM (KLONOPIN) 0.5 MG tablet Take 1 tablet (0.5 mg total) by mouth daily as needed for anxiety (15 tabs per month). 15 tablet 1  . cloNIDine (CATAPRES) 0.1 MG tablet Take 0.1 mg by mouth daily.    . cyclobenzaprine (FLEXERIL) 10 MG tablet Take 10 mg by mouth 3 (three) times daily as needed.    Marland Kitchen ibuprofen (ADVIL) 800 MG tablet Take 1 tablet (800 mg total) by mouth 3 (three) times daily. 21 tablet 0  . levonorgestrel (MIRENA) 20 MCG/24HR IUD 1 each by Intrauterine route once.    Marland Kitchen lisinopril (ZESTRIL) 20 MG tablet Take 20 mg by mouth daily.    Marland Kitchen PARoxetine (PAXIL-CR) 25 MG 24 hr tablet Take 50 mg by mouth every morning.    . sertraline (ZOLOFT) 50 MG tablet 25 mg daily for one week, then 50 mg daily 30 tablet 1   No current facility-administered medications for this visit.     Musculoskeletal: Strength & Muscle Tone: N/A Gait & Station: N/A Patient leans: N/A  Psychiatric Specialty Exam: Review of Systems  Psychiatric/Behavioral: Negative for agitation, behavioral problems, confusion, decreased concentration, dysphoric mood,  hallucinations, self-injury, sleep disturbance and suicidal ideas. The patient is nervous/anxious. The patient is not hyperactive.   All other systems reviewed and are negative.   There were no vitals taken for this visit.There is no height or weight on file to calculate BMI.  General Appearance: Fairly Groomed  Eye Contact:  Good  Speech:  Clear and Coherent  Volume:  Normal  Mood:  better  Affect:  Appropriate, Congruent and less tense  Thought Process:  Coherent  Orientation:  Full (Time, Place, and Person)  Thought Content: Logical   Suicidal Thoughts:  No  Homicidal Thoughts:  No  Memory:  Immediate;   Good  Judgement:  Good  Insight:  Present  Psychomotor Activity:  Normal  Concentration:  Concentration: Good and Attention Span: Good  Recall:  Good  Fund of Knowledge: Good  Language: Good  Akathisia:  No  Handed:  Right  AIMS (if indicated): not done  Assets:  Communication Skills Desire for Improvement  ADL's:  Intact  Cognition: WNL  Sleep:  Fair   Screenings: GAD-7     Office Visit from 07/17/2019 in [place]  Total GAD-7 Score 6    PHQ2-9     Office Visit from 07/17/2019 in [place] Office Visit from 01/10/2018 in [facility]  PHQ-2 Total Score 2 0  PHQ-9 Total Score 8 --       Assessment and Plan:  Angelica Lopez is a 39 y.o. year old female with a history of  depression, anxiety, opioid use disorderin sustained remissionon Suboxone, who presents for follow up appointment for below.   1. GAD (generalized anxiety disorder) 2. MDD (major depressive disorder), recurrent, in partial remission ([place]) She reports significant improvement in depressive symptoms and anxiety since the last visit.  She has not completely switched from Paxil to sertraline with concern of potential side effect.  Provided psychoeducation, and she is amenable to do so to optimize treatment.  Will continue clonazepam at lower dose for short-term use for  anxiety/perceived tremors as  her mood reduction.  Discussed risk of dependence and oversedation especially with concomitant use of Suboxone.  She is aware that clonazepam will not be continued if there are any signs of misuse of substance.   3. Opioid dependence in remission [facility]) She is on Suboxone, prescribed by PCP.  Will continue motivational interview.   4. Cocaine abuse ([place]) She is not from cocaine use at least for several months.  Continues to use marijuana, although she verbalize interest in abstinent. Will continue motivational interview.   Plan 1. Discontinue Paxil 2. Increase sertraline 50 mg daily  3. Continue clonazepam 0.5 mg daily as needed, 15 tabs per month 4. Next appointment- 8/27 at 10:30  for 30 mins, video - Discussed attendance policy - she is on clonidine, prescribed by PCP  Past trials of medication:Wellbutrin, Buspar, hydroxyzine, quetiapine/drowsiness, diazepam  The patient demonstrates the following risk factors for suicide: Chronic risk factors for suicide include:psychiatric disorder ofdepression, anxietyand substance use disorder. Acute risk factorsfor suicide include: family or marital conflict and unemployment. Protective factorsfor this patient include: responsibility to others (children, family), coping skills and hope for the future. Considering these factors, the overall suicide risk at this point appears to below. Patientisappropriate for outpatient follow up.   [name], MD 09/01/2019, 9:41 AM

## 2019-09-01 ENCOUNTER — Telehealth (INDEPENDENT_AMBULATORY_CARE_PROVIDER_SITE_OTHER): Payer: Medicaid Other | Admitting: Psychiatry

## 2019-09-01 ENCOUNTER — Encounter (HOSPITAL_COMMUNITY): Payer: Self-pay | Admitting: Psychiatry

## 2019-09-01 ENCOUNTER — Other Ambulatory Visit: Payer: Self-pay

## 2019-09-01 DIAGNOSIS — F3341 Major depressive disorder, recurrent, in partial remission: Secondary | ICD-10-CM

## 2019-09-01 DIAGNOSIS — F411 Generalized anxiety disorder: Secondary | ICD-10-CM | POA: Diagnosis not present

## 2019-09-01 DIAGNOSIS — F1410 Cocaine abuse, uncomplicated: Secondary | ICD-10-CM | POA: Diagnosis not present

## 2019-09-01 NOTE — Patient Instructions (Signed)
1. Discontinue Paxil 2. Increase sertraline 50 mg daily  3. Continue clonazepam 0.5 mg daily as needed, 15 tabs per month 4. Next appointment- 8/27 at 10:30

## 2019-09-19 ENCOUNTER — Other Ambulatory Visit (HOSPITAL_COMMUNITY): Payer: Self-pay | Admitting: Psychiatry

## 2019-09-19 ENCOUNTER — Telehealth (HOSPITAL_COMMUNITY): Payer: Self-pay | Admitting: *Deleted

## 2019-09-19 NOTE — Telephone Encounter (Signed)
Informed patient with what provider stated and she verbalized understanding.  

## 2019-09-19 NOTE — Telephone Encounter (Signed)
I would not be able to approve it. Clonazepam was ordered for 15 tabs per month. She filled on 7/2 and 7/26 for 30 days each. This should last until around the late August. She should have enough clonazepam if she plans to be out of town on  August 15-20th.

## 2019-09-19 NOTE — Telephone Encounter (Signed)
Spoke with patient and she stated she is going out of town from the 15 th to the 18 th and would like to for provider to call in her Klonopin 20 days early. Per pt she would like for for provider to send in the script on 09-23-2019 to [facility]. Per pt she just dont want to go out of town without enough Klonopin. Per pt she has Medicaid. Staff informed patient that due to her having medicaid, medicaid will not pay for her medication to be 20 days early and if anything they might approve 2 days early. Per pt she got her previous pick up 10 days early so her pharmacy does allow it. Informed patient that still will call pharmacy to see what happened because that's not normal. Staff called pharmacy and they informed staff that due to computer error, they did not mean to give patient her script that she just picked up on 26ths of July and have in the system that she can not pick up her Klonopin early until she due. Per pharmacy the script is stating 15 tabs per month. Per pharmacy the only way they will refill early is if the provider approves it.   Patient wants to know if you would approve her Klonopin to be refilled early.

## 2019-10-04 NOTE — Progress Notes (Signed)
Virtual Visit via Telephone Note  I connected with Angelica Lopez on 10/06/19 at 10:30 AM EDT by telephone and verified that I am speaking with the correct person using two identifiers.   I discussed the limitations, risks, security and privacy concerns of performing an evaluation and management service by telephone and the availability of in person appointments. I also discussed with the patient that there may be a patient responsible charge related to this service. The patient expressed understanding and agreed to proceed.     I discussed the assessment and treatment plan with the patient. The patient was provided an opportunity to ask questions and all were answered. The patient agreed with the plan and demonstrated an understanding of the instructions.   The patient was advised to call back or seek an in-person evaluation if the symptoms worsen or if the condition fails to improve as anticipated.  Location: patient- home, provider- home office   I provided 12 minutes of non-face-to-face time during this encounter.   [name], MD    [facility] MD/PA/NP OP Progress Note  10/06/2019 10:50 AM Angelica Lopez Angelica Lopez  MRN:  [id number]  Chief Complaint:  Chief Complaint    Depression; Follow-up; Anxiety     HPI:  This is a follow-up appointment for depression, anxiety and substance use.  She states that she has been able to be off Paxil. She believes sertraline has been helping her mood. She states that there was an year anniversary of the loss of her aunt. She also has crying spells, missing her mother. Everything in the house reminds her of her mother. She has been trying to find full-time job so that she can keep herself busy. She reports good relationship with her 95 year old daughter. She has middle insomnia for the past 2 weeks. She denies feeling depressed. She has fair concentration and energy. She has decreased appetite. She denies significant weight change. She denies SI. She feels  anxious and tense at times. She has less hand tremors. When she is asked about her reporting being out of clonazepam, she states that she was taking up to 2 mg at times. She is also informed that the pharmacy filled medication sooner than expected, and she will not receive the refill as it is not due today. She continues to use marijuana; last use about a week ago.   Per PMP, the patient filled clonazepam for two months since 7/2.    Daily routine:takes care of her children Employment:unemployed Support:neighbor Household:37 year old daughter, and oldest daughter who is with her briefly Marital status:Divorced in 2016, used to be married for 15 years Number of children:3, - 57 yo, 42 yo, middle one is at [employer] state   Visit Diagnosis:    ICD-10-CM   1. GAD (generalized anxiety disorder)  F41.1   2. MDD (major depressive disorder), recurrent, in partial remission ([place])  F33.41     Past Psychiatric History: Please see initial evaluation for full details. I have reviewed the history. No updates at this time.     Past Medical History:  Past Medical History:  Diagnosis Date  . Hypertension   . Kyphosis   . Scheuermann's disease     Past Surgical History:  Procedure Laterality Date  . CESAREAN SECTION    . TONSILLECTOMY      Family Psychiatric History: Please see initial evaluation for full details. I have reviewed the history. No updates at this time.     Family History:  Family History  Problem  Relation Age of Onset  . Anxiety disorder Mother   . Depression Mother   . Hypertension Father   . Diabetes Father   . Anxiety disorder Father   . Depression Father   . Heart disease Maternal Grandfather        has had 3 open heart surgeries  . Febrile seizures Daughter   . Febrile seizures Daughter   . Febrile seizures Daughter     Social History:  Social History   Socioeconomic History  . Marital status: Legally Separated    Spouse name: Not on file  .  Number of children: 3  . Years of education: Not on file  . Highest education level: Not on file  Occupational History  . Not on file  Tobacco Use  . Smoking status: Current Every Day Smoker    Packs/day: 1.00    Years: 15.00    Pack years: 15.00    Types: Cigarettes  . Smokeless tobacco: Never Used  Vaping Use  . Vaping Use: Never used  Substance and Sexual Activity  . Alcohol use: No  . Drug use: Yes    Types: Marijuana    Comment: twice a week  . Sexual activity: Not Currently    Birth control/protection: I.U.D.  Other Topics Concern  . Not on file  Social History Narrative  . Not on file   Social Determinants of Health   Financial Resource Strain: High Risk  . Difficulty of Paying Living Expenses: Hard  Food Insecurity: Food Insecurity Present  . Worried About Charity fundraiser in the Last Year: Sometimes true  . Ran Out of Food in the Last Year: Sometimes true  Transportation Needs: No Transportation Needs  . Lack of Transportation (Medical): No  . Lack of Transportation (Non-Medical): No  Physical Activity: Inactive  . Days of Exercise per Week: 0 days  . Minutes of Exercise per Session: 0 min  Stress: Stress Concern Present  . Feeling of Stress : Very much  Social Connections: Moderately Integrated  . Frequency of Communication with Friends and Family: More than three times a week  . Frequency of Social Gatherings with Friends and Family: More than three times a week  . Attends Religious Services: 1 to 4 times per year  . Active Member of Clubs or Organizations: No  . Attends Archivist Meetings: 1 to 4 times per year  . Marital Status: Divorced    Allergies:  Allergies  Allergen Reactions  . Codeine Anaphylaxis    Reaction:Tongue swells    Metabolic Disorder Labs: No results found for: HGBA1C, MPG No results found for: PROLACTIN No results found for: CHOL, TRIG, HDL, CHOLHDL, VLDL, LDLCALC Lab Results  Component Value Date   TSH 1.81  12/06/2017   TSH 1.422 05/29/2009    Therapeutic Level Labs: No results found for: LITHIUM No results found for: VALPROATE No components found for:  CBMZ  Current Medications: Current Outpatient Medications  Medication Sig Dispense Refill  . Buprenorphine HCl-Naloxone HCl (SUBOXONE) 8-2 MG FILM Place 3 tablets under the tongue daily.    . cetirizine (ZYRTEC) 10 MG tablet Take 10 mg by mouth daily.    . cholecalciferol (VITAMIN D) 1000 UNITS tablet Take 1,000 Units by mouth daily. Take 2 tabs daily    . clonazePAM (KLONOPIN) 0.5 MG tablet Take 1 tablet (0.5 mg total) by mouth 2 (two) times daily as needed for up to 3 days for anxiety. 6 tablet 0  . clonazePAM (KLONOPIN) 0.5  MG tablet Take 1 tablet (0.5 mg total) by mouth daily as needed for anxiety (15 tabs per month). 15 tablet 1  . cloNIDine (CATAPRES) 0.1 MG tablet Take 0.1 mg by mouth daily.    . cyclobenzaprine (FLEXERIL) 10 MG tablet Take 10 mg by mouth 3 (three) times daily as needed.    Marland Kitchen ibuprofen (ADVIL) 800 MG tablet Take 1 tablet (800 mg total) by mouth 3 (three) times daily. 21 tablet 0  . levonorgestrel (MIRENA) 20 MCG/24HR IUD 1 each by Intrauterine route once.    Marland Kitchen lisinopril (ZESTRIL) 20 MG tablet Take 20 mg by mouth daily.    Marland Kitchen PARoxetine (PAXIL-CR) 25 MG 24 hr tablet Take 50 mg by mouth every morning.    . sertraline (ZOLOFT) 50 MG tablet 25 mg daily for one week, then 50 mg daily 30 tablet 1   No current facility-administered medications for this visit.     Musculoskeletal: Strength & Muscle Tone: N/A Gait & Station: N/A Patient leans: N/A  Psychiatric Specialty Exam: Review of Systems  Psychiatric/Behavioral: Positive for sleep disturbance. Negative for agitation, behavioral problems, confusion, decreased concentration, dysphoric mood, hallucinations, self-injury and suicidal ideas. The patient is nervous/anxious. The patient is not hyperactive.   All other systems reviewed and are negative.   There were no  vitals taken for this visit.There is no height or weight on file to calculate BMI.  General Appearance: NA  Eye Contact:  NA  Speech:  Clear and Coherent  Volume:  Normal  Mood:  better  Affect:  NA  Thought Process:  Coherent  Orientation:  Full (Time, Place, and Person)  Thought Content: Logical   Suicidal Thoughts:  No  Homicidal Thoughts:  No  Memory:  Immediate;   Good  Judgement:  Good  Insight:  Present  Psychomotor Activity:  Normal  Concentration:  Concentration: Good and Attention Span: Good  Recall:  Good  Fund of Knowledge: Good  Language: Good  Akathisia:  No  Handed:  Right  AIMS (if indicated): not done  Assets:  Communication Skills Desire for Improvement  ADL's:  Intact  Cognition: WNL  Sleep:  Poor   Screenings: GAD-7     Office Visit from 07/17/2019 in [place]  Total GAD-7 Score 6    PHQ2-9     Office Visit from 07/17/2019 in [place] Office Visit from 01/10/2018 in [facility]  PHQ-2 Total Score 2 0  PHQ-9 Total Score 8 --       Assessment and Plan:  Angelica Lopez is a 40 y.o. year old female with a history of depression, anxiety, opioid use disorderin sustained remissionon Suboxone, who presents for follow up appointment for below.   1. GAD (generalized anxiety disorder) 2. MDD (major depressive disorder), recurrent, in partial remission ([place]) She continues to report anxiety in the context of grief of loss of her family members, including her mother in April 2021. Will uptitrate sertraline to target anxiety and depression. Will continue clonazepam at low dose for short-term use for anxiety/perceived tremors as a harm reduction. Discussed risk of dependence and oversedation, especially with concomitant use of Suboxone. She is reminded again that this provider will not uptitrate the dose.   3. Opioid dependence in remission [facility]) She is on Suboxone,prescribed by PCP.Will continue motivational interview.  4.  Cocaine abuse ([place]) She denies any cocaine use at least for several months. She is at precontemplative stage for marijuana use. We'll continue motivational interview.  Plan 1. Increase sertraline 100 mg daily  3. Continue clonazepam 0.5 mg daily as needed, 15 tabs per month 4. Next appointment- 10/22 at 10:40  for 30 mins, video - Discussed attendance policy - she is on clonidine, prescribed by PCP I have utilized the [place] Controlled Substances Reporting System (PMP AWARxE) to confirm adherence regarding the patient's medication. She filled clonazepam sooner than expected date. The next refill would be on 8/31.   Past trials of medication:Wellbutrin,Buspar, hydroxyzine,quetiapine/drowsiness,diazepam  The patient demonstrates the following risk factors for suicide: Chronic risk factors for suicide include:psychiatric disorder ofdepression, anxietyand substance use disorder. Acute risk factorsfor suicide include: family or marital conflict and unemployment. Protective factorsfor this patient include: responsibility to others (children, family), coping skills and hope for the future. Considering these factors, the overall suicide risk at this point appears to below. Patientisappropriate for outpatient follow up.    [name], MD 10/06/2019, 10:50 AM

## 2019-10-06 ENCOUNTER — Telehealth (INDEPENDENT_AMBULATORY_CARE_PROVIDER_SITE_OTHER): Payer: Medicaid Other | Admitting: Psychiatry

## 2019-10-06 ENCOUNTER — Other Ambulatory Visit: Payer: Self-pay

## 2019-10-06 ENCOUNTER — Encounter (HOSPITAL_COMMUNITY): Payer: Self-pay | Admitting: Psychiatry

## 2019-10-06 DIAGNOSIS — F411 Generalized anxiety disorder: Secondary | ICD-10-CM

## 2019-10-06 DIAGNOSIS — F3341 Major depressive disorder, recurrent, in partial remission: Secondary | ICD-10-CM | POA: Diagnosis not present

## 2019-10-06 MED ORDER — CLONAZEPAM 0.5 MG PO TABS: 0.5000 mg | ORAL_TABLET | Freq: Every day | ORAL | 1 refills | Status: DC | PRN

## 2019-10-06 MED ORDER — SERTRALINE HCL 100 MG PO TABS: 100.0000 mg | ORAL_TABLET | Freq: Every day | ORAL | 1 refills | Status: DC

## 2019-10-06 NOTE — Patient Instructions (Addendum)
1. Increase sertraline 100 mg daily  3. Continue clonazepam 0.5 mg daily as needed, 15 tabs per month 4. Next appointment- 10/22 at 10:40

## 2019-10-13 DIAGNOSIS — F331 Major depressive disorder, recurrent, moderate: Secondary | ICD-10-CM | POA: Insufficient documentation

## 2019-11-03 ENCOUNTER — Other Ambulatory Visit: Payer: Self-pay

## 2019-11-03 ENCOUNTER — Ambulatory Visit
Admission: EM | Admit: 2019-11-03 | Discharge: 2019-11-03 | Disposition: A | Discharge status: Home / Self Care | Payer: Medicaid Other | Attending: Emergency Medicine | Admitting: Emergency Medicine

## 2019-11-03 ENCOUNTER — Encounter: Payer: Self-pay | Admitting: Emergency Medicine

## 2019-11-03 DIAGNOSIS — Z1152 Encounter for screening for COVID-19: Secondary | ICD-10-CM

## 2019-11-03 DIAGNOSIS — J069 Acute upper respiratory infection, unspecified: Secondary | ICD-10-CM | POA: Diagnosis not present

## 2019-11-03 MED ORDER — BENZONATATE 100 MG PO CAPS
100.0000 mg | ORAL_CAPSULE | Freq: Three times a day (TID) | ORAL | 0 refills | Status: DC
Start: 2019-11-03 — End: 2021-06-13

## 2019-11-03 MED ORDER — DEXAMETHASONE 4 MG PO TABS: 4.0000 mg | ORAL_TABLET | Freq: Every day | ORAL | 0 refills | Status: AC

## 2019-11-03 MED ORDER — FLUTICASONE PROPIONATE 50 MCG/ACT NA SUSP
1.0000 | Freq: Every day | NASAL | 0 refills | Status: DC
Start: 2019-11-03 — End: 2022-08-18

## 2019-11-03 MED ORDER — CETIRIZINE HCL 10 MG PO TABS: 10.0000 mg | ORAL_TABLET | Freq: Every day | ORAL | 0 refills | Status: AC

## 2019-11-03 NOTE — ED Provider Notes (Signed)
[facility]   [id number] 11/03/19 Arrival Time: 1501   CC: COVID symptoms  SUBJECTIVE: History from: patient.  Angelica Lopez is a 39 y.o. female who presented to the urgent care for complaint of cough, runny nose for the past 8 days.  Has sibling with the same symptom.  Denies sick exposure to COVID, flu or strep.  Denies recent travel.  Has tried OTC medication without relief.  Denies aggravating factors.  Denies previous symptoms in the past.   Denies fever, chills, fatigue, sinus pain, rhinorrhea, sore throat, SOB, wheezing, chest pain, nausea, changes in bowel or bladder habits.     ROS: As per HPI.  All other pertinent ROS negative.     Past Medical History:  Diagnosis Date  . Hypertension   . Kyphosis   . Scheuermann's disease    Past Surgical History:  Procedure Laterality Date  . CESAREAN SECTION    . TONSILLECTOMY     Allergies  Allergen Reactions  . Codeine Anaphylaxis    Reaction:Tongue swells   No current facility-administered medications on file prior to encounter.   Current Outpatient Medications on File Prior to Encounter  Medication Sig Dispense Refill  . Buprenorphine HCl-Naloxone HCl (SUBOXONE) 8-2 MG FILM Place 3 tablets under the tongue daily.    . cholecalciferol (VITAMIN D) 1000 UNITS tablet Take 1,000 Units by mouth daily. Take 2 tabs daily    . clonazePAM (KLONOPIN) 0.5 MG tablet Take 1 tablet (0.5 mg total) by mouth 2 (two) times daily as needed for up to 3 days for anxiety. 6 tablet 0  . clonazePAM (KLONOPIN) 0.5 MG tablet Take 1 tablet (0.5 mg total) by mouth daily as needed for anxiety (15 tabs per month). 15 tablet 1  . cloNIDine (CATAPRES) 0.1 MG tablet Take 0.1 mg by mouth daily.    . cyclobenzaprine (FLEXERIL) 10 MG tablet Take 10 mg by mouth 3 (three) times daily as needed.    Marland Kitchen ibuprofen (ADVIL) 800 MG tablet Take 1 tablet (800 mg total) by mouth 3 (three) times daily. 21 tablet 0  . levonorgestrel (MIRENA) 20 MCG/24HR IUD 1  each by Intrauterine route once.    Marland Kitchen lisinopril (ZESTRIL) 20 MG tablet Take 20 mg by mouth daily.    . sertraline (ZOLOFT) 100 MG tablet Take 1 tablet (100 mg total) by mouth daily. 30 tablet 1   Social History   Socioeconomic History  . Marital status: Legally Separated    Spouse name: Not on file  . Number of children: 3  . Years of education: Not on file  . Highest education level: Not on file  Occupational History  . Not on file  Tobacco Use  . Smoking status: Current Every Day Smoker    Packs/day: 1.00    Years: 15.00    Pack years: 15.00    Types: Cigarettes  . Smokeless tobacco: Never Used  Vaping Use  . Vaping Use: Never used  Substance and Sexual Activity  . Alcohol use: No  . Drug use: Yes    Types: Marijuana    Comment: twice a week  . Sexual activity: Not Currently    Birth control/protection: I.U.D.  Other Topics Concern  . Not on file  Social History Narrative  . Not on file   Social Determinants of Health   Financial Resource Strain: High Risk  . Difficulty of Paying Living Expenses: Hard  Food Insecurity: Food Insecurity Present  . Worried About Charity fundraiser in the  Last Year: Sometimes true  . Ran Out of Food in the Last Year: Sometimes true  Transportation Needs: No Transportation Needs  . Lack of Transportation (Medical): No  . Lack of Transportation (Non-Medical): No  Physical Activity: Inactive  . Days of Exercise per Week: 0 days  . Minutes of Exercise per Session: 0 min  Stress: Stress Concern Present  . Feeling of Stress : Very much  Social Connections: Moderately Integrated  . Frequency of Communication with Friends and Family: More than three times a week  . Frequency of Social Gatherings with Friends and Family: More than three times a week  . Attends Religious Services: 1 to 4 times per year  . Active Member of Clubs or Organizations: No  . Attends Archivist Meetings: 1 to 4 times per year  . Marital Status:  Divorced  Human resources officer Violence: Not At Risk  . Fear of Current or Ex-Partner: No  . Emotionally Abused: No  . Physically Abused: No  . Sexually Abused: No   Family History  Problem Relation Age of Onset  . Anxiety disorder Mother   . Depression Mother   . Hypertension Father   . Diabetes Father   . Anxiety disorder Father   . Depression Father   . Heart disease Maternal Grandfather        has had 3 open heart surgeries  . Febrile seizures Daughter   . Febrile seizures Daughter   . Febrile seizures Daughter     OBJECTIVE:  Vitals:   11/03/19 1522 11/03/19 1523  BP: 122/83   Pulse: 80   Resp: 19   Temp: 98.5 F (36.9 C)   TempSrc: Oral   SpO2: 97%   Weight:  200 lb (90.7 kg)  Height:  5\' 1"  (1.549 m)     General appearance: alert; appears fatigued, but nontoxic; speaking in full sentences and tolerating own secretions HEENT: NCAT; Ears: EACs clear, TMs pearly gray; Eyes: PERRL.  EOM grossly intact. Sinuses: nontender; Nose: nares patent without rhinorrhea, Throat: oropharynx clear, tonsils non erythematous or enlarged, uvula midline  Neck: supple without LAD Lungs: unlabored respirations, symmetrical air entry; cough: moderate; no respiratory distress; CTAB Heart: regular rate and rhythm.  Radial pulses 2+ symmetrical bilaterally Skin: warm and dry Psychological: alert and cooperative; normal mood and affect  LABS:  No results found for this or any previous visit (from the past 24 hour(s)).   ASSESSMENT & PLAN:  1. URI with cough and congestion   2. Encounter for screening for COVID-19     Meds ordered this encounter  Medications  . cetirizine (ZYRTEC ALLERGY) 10 MG tablet    Sig: Take 1 tablet (10 mg total) by mouth daily.    Dispense:  30 tablet    Refill:  0  . fluticasone (FLONASE) 50 MCG/ACT nasal spray    Sig: Place 1 spray into both nostrils daily for 14 days.    Dispense:  16 g    Refill:  0  . benzonatate (TESSALON) 100 MG capsule    Sig:  Take 1 capsule (100 mg total) by mouth every 8 (eight) hours.    Dispense:  30 capsule    Refill:  0  . dexamethasone (DECADRON) 4 MG tablet    Sig: Take 1 tablet (4 mg total) by mouth daily for 7 days.    Dispense:  7 tablet    Refill:  0    .    COVID testing ordered.  It will  take between 2-7 days for test results.  Someone will contact you regarding abnormal results.    In the meantime: You should remain isolated in your home for 10 days from symptom onset AND greater than 24  hours after symptoms resolution (absence of fever without the use of fever-reducing medication and improvement in respiratory symptoms), whichever is longer Get plenty of rest and push fluids Tessalon Perles prescribed for cough Zyrtec for nasal congestion, runny nose, and/or sore throat Fonase for nasal congestion and runny nose Decadron was prescribed Use medications daily for symptom relief Use OTC medications like ibuprofen or tylenol as needed fever or pain Call or go to the ED if you have any new or worsening symptoms such as fever, worsening cough, shortness of breath, chest tightness, chest pain, turning blue, changes in mental status, etc...   Reviewed expectations re: course of current medical issues. Questions answered. Outlined signs and symptoms indicating need for more acute intervention. Patient verbalized understanding. After Visit Summary given.         Emerson Monte, FNP 11/03/19 1552

## 2019-11-03 NOTE — ED Triage Notes (Signed)
Cough and runny nose x 8 days.  Children were sick with the same symptoms prior to her being sick.

## 2019-11-03 NOTE — Discharge Instructions (Signed)
COVID testing ordered.  It will take between 2-7 days for test results.  Someone will contact you regarding abnormal results.    In the meantime: You should remain isolated in your home for 10 days from symptom onset AND greater than 24 hours after symptoms resolution (absence of fever without the use of fever-reducing medication and improvement in respiratory symptoms), whichever is longer Get plenty of rest and push fluids Tessalon Perles prescribed for cough Zyrtec for nasal congestion, runny nose, and/or sore throat Fonase for nasal congestion and runny nose Decadron was prescribed Use medications daily for symptom relief Use OTC medications like ibuprofen or tylenol as needed fever or pain Call or go to the ED if you have any new or worsening symptoms such as fever, worsening cough, shortness of breath, chest tightness, chest pain, turning blue, changes in mental status, etc...  

## 2019-11-04 LAB — SARS-COV-2, NAA 2 DAY TAT

## 2019-11-04 LAB — NOVEL CORONAVIRUS, NAA: SARS-CoV-2, NAA: NOT DETECTED

## 2019-11-27 NOTE — Progress Notes (Deleted)
Tom Green MD/PA/NP OP Progress Note  11/27/2019 10:18 AM [place]  MRN:  [id number]  Chief Complaint:  HPI: *** Visit Diagnosis: No diagnosis found.  Past Psychiatric History: Please see initial evaluation for full details. I have reviewed the history. No updates at this time.     Past Medical History:  Past Medical History:  Diagnosis Date  . Hypertension   . Kyphosis   . Scheuermann's disease     Past Surgical History:  Procedure Laterality Date  . CESAREAN SECTION    . TONSILLECTOMY      Family Psychiatric History: Please see initial evaluation for full details. I have reviewed the history. No updates at this time.     Family History:  Family History  Problem Relation Age of Onset  . Anxiety disorder Mother   . Depression Mother   . Hypertension Father   . Diabetes Father   . Anxiety disorder Father   . Depression Father   . Heart disease Maternal Grandfather        has had 3 open heart surgeries  . Febrile seizures Daughter   . Febrile seizures Daughter   . Febrile seizures Daughter     Social History:  Social History   Socioeconomic History  . Marital status: Legally Separated    Spouse name: Not on file  . Number of children: 3  . Years of education: Not on file  . Highest education level: Not on file  Occupational History  . Not on file  Tobacco Use  . Smoking status: Current Every Day Smoker    Packs/day: 1.00    Years: 15.00    Pack years: 15.00    Types: Cigarettes  . Smokeless tobacco: Never Used  Vaping Use  . Vaping Use: Never used  Substance and Sexual Activity  . Alcohol use: No  . Drug use: Yes    Types: Marijuana    Comment: twice a week  . Sexual activity: Not Currently    Birth control/protection: I.U.D.  Other Topics Concern  . Not on file  Social History Narrative  . Not on file   Social Determinants of Health   Financial Resource Strain: High Risk  . Difficulty of Paying Living Expenses: Hard  Food Insecurity:  Food Insecurity Present  . Worried About Charity fundraiser in the Last Year: Sometimes true  . Ran Out of Food in the Last Year: Sometimes true  Transportation Needs: No Transportation Needs  . Lack of Transportation (Medical): No  . Lack of Transportation (Non-Medical): No  Physical Activity: Inactive  . Days of Exercise per Week: 0 days  . Minutes of Exercise per Session: 0 min  Stress: Stress Concern Present  . Feeling of Stress : Very much  Social Connections: Moderately Integrated  . Frequency of Communication with Friends and Family: More than three times a week  . Frequency of Social Gatherings with Friends and Family: More than three times a week  . Attends Religious Services: 1 to 4 times per year  . Active Member of Clubs or Organizations: No  . Attends Archivist Meetings: 1 to 4 times per year  . Marital Status: Divorced    Allergies:  Allergies  Allergen Reactions  . Codeine Anaphylaxis    Reaction:Tongue swells    Metabolic Disorder Labs: No results found for: HGBA1C, MPG No results found for: PROLACTIN No results found for: CHOL, TRIG, HDL, CHOLHDL, VLDL, LDLCALC Lab Results  Component Value Date   TSH  1.81 12/06/2017   TSH 1.422 05/29/2009    Therapeutic Level Labs: No results found for: LITHIUM No results found for: VALPROATE No components found for:  CBMZ  Current Medications: Current Outpatient Medications  Medication Sig Dispense Refill  . benzonatate (TESSALON) 100 MG capsule Take 1 capsule (100 mg total) by mouth every 8 (eight) hours. 30 capsule 0  . Buprenorphine HCl-Naloxone HCl (SUBOXONE) 8-2 MG FILM Place 3 tablets under the tongue daily.    . cetirizine (ZYRTEC ALLERGY) 10 MG tablet Take 1 tablet (10 mg total) by mouth daily. 30 tablet 0  . cholecalciferol (VITAMIN D) 1000 UNITS tablet Take 1,000 Units by mouth daily. Take 2 tabs daily    . clonazePAM (KLONOPIN) 0.5 MG tablet Take 1 tablet (0.5 mg total) by mouth 2 (two) times  daily as needed for up to 3 days for anxiety. 6 tablet 0  . clonazePAM (KLONOPIN) 0.5 MG tablet Take 1 tablet (0.5 mg total) by mouth daily as needed for anxiety (15 tabs per month). 15 tablet 1  . cloNIDine (CATAPRES) 0.1 MG tablet Take 0.1 mg by mouth daily.    . cyclobenzaprine (FLEXERIL) 10 MG tablet Take 10 mg by mouth 3 (three) times daily as needed.    . fluticasone (FLONASE) 50 MCG/ACT nasal spray Place 1 spray into both nostrils daily for 14 days. 16 g 0  . ibuprofen (ADVIL) 800 MG tablet Take 1 tablet (800 mg total) by mouth 3 (three) times daily. 21 tablet 0  . levonorgestrel (MIRENA) 20 MCG/24HR IUD 1 each by Intrauterine route once.    Marland Kitchen lisinopril (ZESTRIL) 20 MG tablet Take 20 mg by mouth daily.    . sertraline (ZOLOFT) 100 MG tablet Take 1 tablet (100 mg total) by mouth daily. 30 tablet 1   No current facility-administered medications for this visit.     Musculoskeletal: Strength & Muscle Tone: N/A Gait & Station: N/A Patient leans: N/A  Psychiatric Specialty Exam: Review of Systems  There were no vitals taken for this visit.There is no height or weight on file to calculate BMI.  General Appearance: {Appearance:22683}  Eye Contact:  {BHH EYE CONTACT:22684}  Speech:  Clear and Coherent  Volume:  Normal  Mood:  {BHH MOOD:22306}  Affect:  {Affect (PAA):22687}  Thought Process:  Coherent  Orientation:  Full (Time, Place, and Person)  Thought Content: Logical   Suicidal Thoughts:  {ST/HT (PAA):22692}  Homicidal Thoughts:  {ST/HT (PAA):22692}  Memory:  Immediate;   Good  Judgement:  {Judgement (PAA):22694}  Insight:  {Insight (PAA):22695}  Psychomotor Activity:  Normal  Concentration:  Concentration: Good and Attention Span: Good  Recall:  Good  Fund of Knowledge: Good  Language: Good  Akathisia:  No  Handed:  Right  AIMS (if indicated): not done  Assets:  Communication Skills Desire for Improvement  ADL's:  Intact  Cognition: WNL  Sleep:  {BHH  GOOD/FAIR/POOR:22877}   Screenings: GAD-7     Office Visit from 07/17/2019 in [facility]  Total GAD-7 Score 6    PHQ2-9     Office Visit from 07/17/2019 in [facility] Visit from 01/10/2018 in [facility]  PHQ-2 Total Score 2 0  PHQ-9 Total Score 8 --       Assessment and Plan:  Angelica Lopez is a 39 y.o. year old female with a history of depression, anxiety, opioid use disorderin sustained remissionon Suboxone, who presents for follow up appointment for below.   1. GAD (  generalized anxiety disorder) 2. MDD (major depressive disorder), recurrent, in partial remission ([place]) She continues to report anxiety in the context of grief of loss of her family members, including her mother in April 2021. Will uptitrate sertraline to target anxiety and depression. Will continue clonazepam at low dose for short-term use for anxiety/perceived tremors as a harm reduction. Discussed risk of dependence and oversedation, especially with concomitant use of Suboxone. She is reminded again that this provider will not uptitrate the dose.   3. Opioid dependence in remission [facility]) She is on Suboxone,prescribed by PCP.Will continue motivational interview.  4. Cocaine abuse ([place]) She denies any cocaine use at least for several months. She is at precontemplative stage for marijuana use. We'll continue motivational interview.   Plan 1. Increase sertraline100 mg daily  3.Continueclonazepam 0.5 mg daily as needed, 15 tabs per month 4. Next appointment- 10/22 at 10:47for 30 mins, video - Discussed attendance policy - she is on clonidine, prescribed by PCP I have utilized the [place] Controlled Substances Reporting System (PMP AWARxE) to confirm adherence regarding the patient's medication. She filled clonazepam sooner than expected date. The next refill would be on 8/31.   Past trials of medication:Wellbutrin,Buspar,  hydroxyzine,quetiapine/drowsiness,diazepam  The patient demonstrates the following risk factors for suicide: Chronic risk factors for suicide include:psychiatric disorder ofdepression, anxietyand substance use disorder. Acute risk factorsfor suicide include: family or marital conflict and unemployment. Protective factorsfor this patient include: responsibility to others (children, family), coping skills and hope for the future. Considering these factors, the overall suicide risk at this point appears to below. Patientisappropriate for outpatient follow up.   [name], MD 11/27/2019, 10:18 AM

## 2019-12-01 ENCOUNTER — Other Ambulatory Visit (HOSPITAL_COMMUNITY): Payer: Self-pay | Admitting: Psychiatry

## 2019-12-01 ENCOUNTER — Telehealth (HOSPITAL_COMMUNITY): Payer: Self-pay | Admitting: Psychiatry

## 2019-12-01 ENCOUNTER — Telehealth (HOSPITAL_COMMUNITY): Payer: Medicaid Other | Admitting: Psychiatry

## 2019-12-01 ENCOUNTER — Telehealth (HOSPITAL_COMMUNITY): Payer: Self-pay | Admitting: *Deleted

## 2019-12-01 ENCOUNTER — Other Ambulatory Visit: Payer: Self-pay

## 2019-12-01 MED ORDER — SERTRALINE HCL 100 MG PO TABS: 100.0000 mg | ORAL_TABLET | Freq: Every day | ORAL | 0 refills | Status: DC

## 2019-12-01 NOTE — Telephone Encounter (Signed)
Patient called office stating her phone is messing up and it would not let her connect to the providers appt. Per pt she kept trying to get the messages. Per pt she even tried to call back but her phone would not let her make outgoing calls. Per pt she needs refills for her medications provider prescribes her.

## 2019-12-01 NOTE — Telephone Encounter (Signed)
Zoloft and Klonopin per patient's chart.

## 2019-12-01 NOTE — Telephone Encounter (Signed)
She should have enough Klonopin until the next visit.

## 2019-12-01 NOTE — Telephone Encounter (Signed)
Ordered sertraline refill. She should have enough clonazepam until the next visit.

## 2019-12-01 NOTE — Telephone Encounter (Signed)
Sent link for video visit through [facility]. Patient did not sign in. Called the patient twice for appointment scheduled today. The patient did not answer the phone. Left voice message to contact the office.

## 2019-12-01 NOTE — Telephone Encounter (Signed)
I called her twice for the appointment in case video did not work, and she did not answer. Please reschedule her. Please also ask which medication she needs.

## 2019-12-04 NOTE — Progress Notes (Signed)
Virtual Visit via Video Note  I connected with Angelica Lopez on 12/05/19 at  4:20 PM EDT by a video enabled telemedicine application and verified that I am speaking with the correct person using two identifiers.  Location: Patient: home Provider: office    I discussed the limitations of evaluation and management by telemedicine and the availability of in person appointments. The patient expressed understanding and agreed to proceed.   I discussed the assessment and treatment plan with the patient. The patient was provided an opportunity to ask questions and all were answered. The patient agreed with the plan and demonstrated an understanding of the instructions.   The patient was advised to call back or seek an in-person evaluation if the symptoms worsen or if the condition fails to improve as anticipated.  I provided 15 minutes of non-face-to-face time during this encounter.   [name], MD    [facility] MD/PA/NP OP Progress Note  12/05/2019 4:52 PM [place]  MRN:  [id number]  Chief Complaint:  Chief Complaint    Depression; Follow-up; Anxiety     HPI:  This is a follow-up appointment for depression.  She states that she has been depressed especially over the past month, although she was doing better with higher dose of sertraline.  This is the first time she has her birthday and Christmas without her family's.  She burst into tears, and misses her mother.  She tends to stay in the bed most of the time, although she is usually up during the day.  She has been able to take care of her children.  She reports great relationship with her daughters.  She is stressed the oldest daughter and lives with the patient while she lives with the child support.  Although she wants to work, there is nobody to take care of her daughter.  She reports good support from neighbors.  She enjoys doing Psychologist, occupational work at the apartment.  She has lived at the apartment for 10 years, and knows people well.   She shared the letter from her father, who has been in jail most of the time.  She states that he does not care about her or his family.  She has insomnia.  She feels fatigue.  She feels depressed.  She denies any change in weight.  She denies SI.  She denies alcohol use or drug use.   Daily routine:takes care of her children Employment:unemployed, using child support Support:neighbor Household:38year old daughter, and oldest daughter who is with her for a few months before going back to college Marital status:Divorced in 2016, used to be married for 15 years Number of children:3, - 38 yo, 64 yo, middle one is at [employer] state  Visit Diagnosis:    ICD-10-CM   1. MDD (major depressive disorder), recurrent episode, mild ([place])  F33.0   2. GAD (generalized anxiety disorder)  F41.1     Past Psychiatric History: Please see initial evaluation for full details. I have reviewed the history. No updates at this time.     Past Medical History:  Past Medical History:  Diagnosis Date  . Hypertension   . Kyphosis   . Scheuermann's disease     Past Surgical History:  Procedure Laterality Date  . CESAREAN SECTION    . TONSILLECTOMY      Family Psychiatric History: Please see initial evaluation for full details. I have reviewed the history. No updates at this time.     Family History:  Family History  Problem Relation Age of Onset  . Anxiety disorder Mother   . Depression Mother   . Hypertension Father   . Diabetes Father   . Anxiety disorder Father   . Depression Father   . Heart disease Maternal Grandfather        has had 3 open heart surgeries  . Febrile seizures Daughter   . Febrile seizures Daughter   . Febrile seizures Daughter     Social History:  Social History   Socioeconomic History  . Marital status: Legally Separated    Spouse name: Not on file  . Number of children: 3  . Years of education: Not on file  . Highest education level: Not on file   Occupational History  . Not on file  Tobacco Use  . Smoking status: Current Every Day Smoker    Packs/day: 1.00    Years: 15.00    Pack years: 15.00    Types: Cigarettes  . Smokeless tobacco: Never Used  Vaping Use  . Vaping Use: Never used  Substance and Sexual Activity  . Alcohol use: No  . Drug use: Yes    Types: Marijuana    Comment: twice a week  . Sexual activity: Not Currently    Birth control/protection: I.U.D.  Other Topics Concern  . Not on file  Social History Narrative  . Not on file   Social Determinants of Health   Financial Resource Strain: High Risk  . Difficulty of Paying Living Expenses: Hard  Food Insecurity: Food Insecurity Present  . Worried About Charity fundraiser in the Last Year: Sometimes true  . Ran Out of Food in the Last Year: Sometimes true  Transportation Needs: No Transportation Needs  . Lack of Transportation (Medical): No  . Lack of Transportation (Non-Medical): No  Physical Activity: Inactive  . Days of Exercise per Week: 0 days  . Minutes of Exercise per Session: 0 min  Stress: Stress Concern Present  . Feeling of Stress : Very much  Social Connections: Moderately Integrated  . Frequency of Communication with Friends and Family: More than three times a week  . Frequency of Social Gatherings with Friends and Family: More than three times a week  . Attends Religious Services: 1 to 4 times per year  . Active Member of Clubs or Organizations: No  . Attends Archivist Meetings: 1 to 4 times per year  . Marital Status: Divorced    Allergies:  Allergies  Allergen Reactions  . Codeine Anaphylaxis    Reaction:Tongue swells    Metabolic Disorder Labs: No results found for: HGBA1C, MPG No results found for: PROLACTIN No results found for: CHOL, TRIG, HDL, CHOLHDL, VLDL, LDLCALC Lab Results  Component Value Date   TSH 1.81 12/06/2017   TSH 1.422 05/29/2009    Therapeutic Level Labs: No results found for: LITHIUM No  results found for: VALPROATE No components found for:  CBMZ  Current Medications: Current Outpatient Medications  Medication Sig Dispense Refill  . benzonatate (TESSALON) 100 MG capsule Take 1 capsule (100 mg total) by mouth every 8 (eight) hours. 30 capsule 0  . Buprenorphine HCl-Naloxone HCl (SUBOXONE) 8-2 MG FILM Place 3 tablets under the tongue daily.    . cetirizine (ZYRTEC ALLERGY) 10 MG tablet Take 1 tablet (10 mg total) by mouth daily. 30 tablet 0  . cholecalciferol (VITAMIN D) 1000 UNITS tablet Take 1,000 Units by mouth daily. Take 2 tabs daily    . clonazePAM (KLONOPIN) 0.5 MG tablet Take  1 tablet (0.5 mg total) by mouth 2 (two) times daily as needed for up to 3 days for anxiety. 6 tablet 0  . [START ON 12/09/2019] clonazePAM (KLONOPIN) 0.5 MG tablet Take 1 tablet (0.5 mg total) by mouth daily as needed for anxiety (15 tabs per month). 15 tablet 1  . cloNIDine (CATAPRES) 0.1 MG tablet Take 0.1 mg by mouth daily.    . cyclobenzaprine (FLEXERIL) 10 MG tablet Take 10 mg by mouth 3 (three) times daily as needed.    . fluticasone (FLONASE) 50 MCG/ACT nasal spray Place 1 spray into both nostrils daily for 14 days. 16 g 0  . ibuprofen (ADVIL) 800 MG tablet Take 1 tablet (800 mg total) by mouth 3 (three) times daily. 21 tablet 0  . levonorgestrel (MIRENA) 20 MCG/24HR IUD 1 each by Intrauterine route once.    Marland Kitchen lisinopril (ZESTRIL) 20 MG tablet Take 20 mg by mouth daily.    . sertraline (ZOLOFT) 100 MG tablet Take 1.5 tablets (150 mg total) by mouth daily. 45 tablet 1   No current facility-administered medications for this visit.     Musculoskeletal: Strength & Muscle Tone: N/A Gait & Station: N?A Patient leans: N/A  Psychiatric Specialty Exam: Review of Systems  Psychiatric/Behavioral: Positive for dysphoric mood and sleep disturbance. Negative for agitation, behavioral problems, confusion, decreased concentration, hallucinations, self-injury and suicidal ideas. The patient is  nervous/anxious. The patient is not hyperactive.   All other systems reviewed and are negative.   There were no vitals taken for this visit.There is no height or weight on file to calculate BMI.  General Appearance: Fairly Groomed  Eye Contact:  Good  Speech:  Clear and Coherent  Volume:  Normal  Mood:  Depressed  Affect:  Appropriate, Congruent and Restricted  Thought Process:  Coherent  Orientation:  Full (Time, Place, and Person)  Thought Content: Logical   Suicidal Thoughts:  No  Homicidal Thoughts:  No  Memory:  Immediate;   Good  Judgement:  Good  Insight:  Fair  Psychomotor Activity:  Normal  Concentration:  Concentration: Good and Attention Span: Good  Recall:  Good  Fund of Knowledge: Good  Language: Good  Akathisia:  No  Handed:  Right  AIMS (if indicated): not done  Assets:  Communication Skills Desire for Improvement  ADL's:  Intact  Cognition: WNL  Sleep:  Poor   Screenings: GAD-7     Office Visit from 07/17/2019 in [facility]  Total GAD-7 Score 6    PHQ2-9     Office Visit from 07/17/2019 in [facility] Office Visit from 01/10/2018 in [facility]  PHQ-2 Total Score 2 0  PHQ-9 Total Score 8 --       Assessment and Plan:  Markiah LONIE RUMMELL is a 39 y.o. year old female with a history of depression, anxiety, opioid use disorderin sustained remissionon Suboxone, who presents for follow up appointment for below.   1. MDD (major depressive disorder), recurrent episode, mild ([place]) 2. GAD (generalized anxiety disorder) She reports worsening in depressive symptoms and anxiety in the context of upcoming holiday/grief of loss of her family members, including her mother he April 2021.  We will do further up titration of sertraline to optimize treatment for depression and anxiety.  Will continue clonazepam as needed for anxiety/perceived tremors.  Discussed risk of dependence and oversedation, especially with concomitant use of Suboxone.  She  verbalized her understanding that the dose will NOT be uptitrated in  the future.   3. Opioid dependence in remission [facility]) She is on Suboxone,prescribed by PCP.Will continue motivational interview.  4. Cocaine abuse ([place]) She denies any cocaine use at least for several months. She is at precontemplative stage for marijuana use. We'll continue motivational interview.   Plan 1. Increase sertraline150 mg daily  3.Continueclonazepam 0.5 mg daily as needed, 15 tabs per month 4. Next appointment-  12/7 at 3 48for 30 mins, video  [email or web address] - Discussed attendance policy - she is on clonidine, prescribed by PCP  Past trials of medication:Wellbutrin,Buspar, hydroxyzine,quetiapine/drowsiness,diazepam  The patient demonstrates the following risk factors for suicide: Chronic risk factors for suicide include:psychiatric disorder ofdepression, anxietyand substance use disorder. Acute risk factorsfor suicide include: family or marital conflict and unemployment. Protective factorsfor this patient include: responsibility to others (children, family), coping skills and hope for the future. Considering these factors, the overall suicide risk at this point appears to below. Patientisappropriate for outpatient follow up.  [name], MD 12/05/2019, 4:52 PM

## 2019-12-04 NOTE — Telephone Encounter (Signed)
Patient aware.

## 2019-12-04 NOTE — Telephone Encounter (Signed)
Per pt she did not want the Klonopin she was asking for the depression medication.

## 2019-12-05 ENCOUNTER — Encounter (HOSPITAL_COMMUNITY): Payer: Self-pay | Admitting: Psychiatry

## 2019-12-05 ENCOUNTER — Telehealth (INDEPENDENT_AMBULATORY_CARE_PROVIDER_SITE_OTHER): Payer: Medicaid Other | Admitting: Psychiatry

## 2019-12-05 ENCOUNTER — Other Ambulatory Visit: Payer: Self-pay

## 2019-12-05 DIAGNOSIS — F330 Major depressive disorder, recurrent, mild: Secondary | ICD-10-CM

## 2019-12-05 DIAGNOSIS — F411 Generalized anxiety disorder: Secondary | ICD-10-CM | POA: Diagnosis not present

## 2019-12-05 MED ORDER — CLONAZEPAM 0.5 MG PO TABS: 0.5000 mg | ORAL_TABLET | Freq: Every day | ORAL | 1 refills | Status: DC | PRN

## 2019-12-05 MED ORDER — SERTRALINE HCL 100 MG PO TABS: 150.0000 mg | ORAL_TABLET | Freq: Every day | ORAL | 1 refills | Status: DC

## 2019-12-05 NOTE — Patient Instructions (Signed)
1. Increase sertraline150 mg daily  3.Continueclonazepam 0.5 mg daily as needed, 15 tabs per month 4. Next appointment-  12/7 at 3 30

## 2020-01-08 NOTE — Progress Notes (Signed)
Virtual Visit via Video Note  I connected with Angelica Lopez on 01/16/20 at  3:30 PM EST by a video enabled telemedicine application and verified that I am speaking with the correct person using two identifiers.  Location: Patient: outside Provider: office Persons participated in the visit- patient, provider   I discussed the limitations of evaluation and management by telemedicine and the availability of in person appointments. The patient expressed understanding and agreed to proceed.   I discussed the assessment and treatment plan with the patient. The patient was provided an opportunity to ask questions and all were answered. The patient agreed with the plan and demonstrated an understanding of the instructions.   The patient was advised to call back or seek an in-person evaluation if the symptoms worsen or if the condition fails to improve as anticipated.  I provided 17 minutes of non-face-to-face time during this encounter.   [name], MD    [facility] MD/PA/NP OP Progress Note  01/16/2020 3:55 PM [place]  MRN:  [id number]  Chief Complaint:  Chief Complaint    Depression; Follow-up; Anxiety     HPI:  - sertraline was tapered down by her PCP due to fatigue This is a follow-up appointment for depression and anxiety.  She states that her anxiety has been high, which she refers to the holiday season.  She also talks about her father, who is currently in jail.  Although the plan was for him to be released in early December, she does not know what is happening.  She is concerned that she is her only child, and she may need to take care of him, although his disability has been cut off.  She is also stressed as she needs to take care of her children, bringing them back and forth between home and school.  She feels a little better since bupropion was started by her PCP.  She feels more fatigued when she tried higher dose of sertraline.  She believes current combination of the  medication works well for her.  She has occasional insomnia.  She feels less fatigue.  She has fair concentration.  She gained 7 pounds, which she attributes to immobility due to recent fatigue.  She denies SI.  She takes clonazepam every other day.  She denies alcohol use.  She cut down marijuana use; she uses it once a week.   Daily routine:takes care of her children Employment:unemployed, using child support Support:neighbor Household:69year old daughter, and oldest daughter who is with her for a few months before going back to college Marital status:Divorced in 2016, used to be married for 15 years Number of children:3, - 3 yo, 63 yo, middle one is at [employer] state  Visit Diagnosis:    ICD-10-CM   1. MDD (major depressive disorder), recurrent episode, mild ([place])  F33.0   2. GAD (generalized anxiety disorder)  F41.1     Past Psychiatric History: Please see initial evaluation for full details. I have reviewed the history. No updates at this time.     Past Medical History:  Past Medical History:  Diagnosis Date  . Hypertension   . Kyphosis   . Scheuermann's disease     Past Surgical History:  Procedure Laterality Date  . CESAREAN SECTION    . TONSILLECTOMY      Family Psychiatric History: Please see initial evaluation for full details. I have reviewed the history. No updates at this time.     Family History:  Family History  Problem  Relation Age of Onset  . Anxiety disorder Mother   . Depression Mother   . Hypertension Father   . Diabetes Father   . Anxiety disorder Father   . Depression Father   . Heart disease Maternal Grandfather        has had 3 open heart surgeries  . Febrile seizures Daughter   . Febrile seizures Daughter   . Febrile seizures Daughter     Social History:  Social History   Socioeconomic History  . Marital status: Legally Separated    Spouse name: Not on file  . Number of children: 3  . Years of education: Not on file  .  Highest education level: Not on file  Occupational History  . Not on file  Tobacco Use  . Smoking status: Current Every Day Smoker    Packs/day: 1.00    Years: 15.00    Pack years: 15.00    Types: Cigarettes  . Smokeless tobacco: Never Used  Vaping Use  . Vaping Use: Never used  Substance and Sexual Activity  . Alcohol use: No  . Drug use: Yes    Types: Marijuana    Comment: twice a week  . Sexual activity: Not Currently    Birth control/protection: I.U.D.  Other Topics Concern  . Not on file  Social History Narrative  . Not on file   Social Determinants of Health   Financial Resource Strain: High Risk  . Difficulty of Paying Living Expenses: Hard  Food Insecurity: Food Insecurity Present  . Worried About Charity fundraiser in the Last Year: Sometimes true  . Ran Out of Food in the Last Year: Sometimes true  Transportation Needs: No Transportation Needs  . Lack of Transportation (Medical): No  . Lack of Transportation (Non-Medical): No  Physical Activity: Inactive  . Days of Exercise per Week: 0 days  . Minutes of Exercise per Session: 0 min  Stress: Stress Concern Present  . Feeling of Stress : Very much  Social Connections: Moderately Integrated  . Frequency of Communication with Friends and Family: More than three times a week  . Frequency of Social Gatherings with Friends and Family: More than three times a week  . Attends Religious Services: 1 to 4 times per year  . Active Member of Clubs or Organizations: No  . Attends Archivist Meetings: 1 to 4 times per year  . Marital Status: Divorced    Allergies:  Allergies  Allergen Reactions  . Codeine Anaphylaxis    Reaction:Tongue swells    Metabolic Disorder Labs: No results found for: HGBA1C, MPG No results found for: PROLACTIN No results found for: CHOL, TRIG, HDL, CHOLHDL, VLDL, LDLCALC Lab Results  Component Value Date   TSH 1.81 12/06/2017   TSH 1.422 05/29/2009    Therapeutic Level  Labs: No results found for: LITHIUM No results found for: VALPROATE No components found for:  CBMZ  Current Medications: Current Outpatient Medications  Medication Sig Dispense Refill  . benzonatate (TESSALON) 100 MG capsule Take 1 capsule (100 mg total) by mouth every 8 (eight) hours. 30 capsule 0  . Buprenorphine HCl-Naloxone HCl (SUBOXONE) 8-2 MG FILM Place 3 tablets under the tongue daily.    . cetirizine (ZYRTEC ALLERGY) 10 MG tablet Take 1 tablet (10 mg total) by mouth daily. 30 tablet 0  . cholecalciferol (VITAMIN D) 1000 UNITS tablet Take 1,000 Units by mouth daily. Take 2 tabs daily    . clonazePAM (KLONOPIN) 0.5 MG tablet Take 1  tablet (0.5 mg total) by mouth 2 (two) times daily as needed for up to 3 days for anxiety. 6 tablet 0  . [START ON 02/05/2020] clonazePAM (KLONOPIN) 0.5 MG tablet Take 1 tablet (0.5 mg total) by mouth daily as needed for anxiety (15 tabs per month). 15 tablet 1  . cloNIDine (CATAPRES) 0.1 MG tablet Take 0.1 mg by mouth daily.    . cyclobenzaprine (FLEXERIL) 10 MG tablet Take 10 mg by mouth 3 (three) times daily as needed.    . fluticasone (FLONASE) 50 MCG/ACT nasal spray Place 1 spray into both nostrils daily for 14 days. 16 g 0  . ibuprofen (ADVIL) 800 MG tablet Take 1 tablet (800 mg total) by mouth 3 (three) times daily. 21 tablet 0  . levonorgestrel (MIRENA) 20 MCG/24HR IUD 1 each by Intrauterine route once.    Marland Kitchen lisinopril (ZESTRIL) 20 MG tablet Take 20 mg by mouth daily.    . sertraline (ZOLOFT) 100 MG tablet Take 1.5 tablets (150 mg total) by mouth daily. 45 tablet 1   No current facility-administered medications for this visit.     Musculoskeletal: Strength & Muscle Tone: N/A Gait & Station: N/A Patient leans: N/A  Psychiatric Specialty Exam: Review of Systems  Psychiatric/Behavioral: Positive for dysphoric mood and sleep disturbance. Negative for agitation, behavioral problems, confusion, decreased concentration, hallucinations, self-injury  and suicidal ideas. The patient is nervous/anxious. The patient is not hyperactive.   All other systems reviewed and are negative.   There were no vitals taken for this visit.There is no height or weight on file to calculate BMI.  General Appearance: Fairly Groomed  Eye Contact:  Good  Speech:  Clear and Coherent  Volume:  Normal  Mood:  stressed  Affect:  Appropriate, Congruent and euthymic  Thought Process:  Coherent  Orientation:  Full (Time, Place, and Person)  Thought Content: Logical   Suicidal Thoughts:  No  Homicidal Thoughts:  No  Memory:  Immediate;   Good  Judgement:  Good  Insight:  Fair  Psychomotor Activity:  Normal  Concentration:  Concentration: Good and Attention Span: Good  Recall:  Good  Fund of Knowledge: Good  Language: Good  Akathisia:  No  Handed:  Right  AIMS (if indicated): not done  Assets:  Communication Skills Desire for Improvement  ADL's:  Intact  Cognition: WNL  Sleep:  Poor   Screenings: GAD-7     Office Visit from 07/17/2019 in [facility]  Total GAD-7 Score 6    PHQ2-9     Office Visit from 07/17/2019 in [facility] Office Visit from 01/10/2018 in [facility]  PHQ-2 Total Score 2 0  PHQ-9 Total Score 8 --       Assessment and Plan:  Angelica Lopez is a 39 y.o. year old female with a history of  depression, anxiety, opioid use disorderin sustained remissionon Suboxone, who presents for follow up appointment for below.   1. MDD (major depressive disorder), recurrent episode, mild ([place]) 2. GAD (generalized anxiety disorder) She reports improvement in depressive symptoms after starting bupropion.  Psychosocial stressors includes her father, who will be released from jail, loss of her family members, including her mother in April 2021. She could not tolerate higher dose of sertraline due to fatigue.  Will taper down sertraline to minimize side effect/target depression and anxiety.  We will continue bupropion  adjunctive treatment for depression given she reports good benefit.  Will continue clonazepam as needed for anxiety/perceived  hand tremors.  Discussed risk of dependence and oversedation, especially with concomitant use of Suboxone.  She verbalizes her understanding that the dose will not be uptitrated in the future.   3. Opioid dependence in remission [facility]) She is on Suboxone,prescribed by PCP.Will continue motivational interview.  4. Cocaine abuse ([place]) Shedenies any cocaine use at least for several months.She is at precontemplative stage for marijuana use.We'll continue motivational interview.  Plan 1. Decrease sertraline100 mg daily  2. Continue bupropion 150 mg daily  3.Continueclonazepam 0.5 mg daily as needed, 15 tabs per month 4. Next appointment- 2/21 at 1:28for 30 mins, video  [email or web address] - Discussed attendance policy - she is on clonidine, prescribed by PCP  Past trials of medication:bupropion,Buspar, hydroxyzine,quetiapine/drowsiness,diazepam  The patient demonstrates the following risk factors for suicide: Chronic risk factors for suicide include:psychiatric disorder ofdepression, anxietyand substance use disorder. Acute risk factorsfor suicide include: family or marital conflict and unemployment. Protective factorsfor this patient include: responsibility to others (children, family), coping skills and hope for the future. Considering these factors, the overall suicide risk at this point appears to below. Patientisappropriate for outpatient follow up.   [name], MD 01/16/2020, 3:55 PM

## 2020-01-16 ENCOUNTER — Telehealth (INDEPENDENT_AMBULATORY_CARE_PROVIDER_SITE_OTHER): Payer: Medicaid Other | Admitting: Psychiatry

## 2020-01-16 ENCOUNTER — Encounter: Payer: Self-pay | Admitting: Psychiatry

## 2020-01-16 ENCOUNTER — Other Ambulatory Visit: Payer: Self-pay

## 2020-01-16 DIAGNOSIS — F411 Generalized anxiety disorder: Secondary | ICD-10-CM

## 2020-01-16 DIAGNOSIS — F330 Major depressive disorder, recurrent, mild: Secondary | ICD-10-CM

## 2020-01-16 MED ORDER — CLONAZEPAM 0.5 MG PO TABS: 0.5000 mg | ORAL_TABLET | Freq: Every day | ORAL | 1 refills | Status: DC | PRN

## 2020-01-16 NOTE — Patient Instructions (Signed)
1. Decrease sertraline100 mg daily  2. Continue bupropion 150 mg daily  3.Continueclonazepam 0.5 mg daily as needed, 15 tabs per month 4. Next appointment- 2/21 at 1:40

## 2020-01-31 ENCOUNTER — Other Ambulatory Visit: Payer: Self-pay | Admitting: Psychiatry

## 2020-01-31 ENCOUNTER — Telehealth: Payer: Self-pay

## 2020-01-31 MED ORDER — SERTRALINE HCL 100 MG PO TABS: 100.0000 mg | ORAL_TABLET | Freq: Every day | ORAL | 1 refills | Status: DC

## 2020-01-31 NOTE — Telephone Encounter (Signed)
[place] and spoke to [place], pharmacist to make sure other past Sertraline orders were all discontinued, per Dr. Modesta Messing,  and he stated all had been except for the ones received from Dr. Modesta Messing today.

## 2020-01-31 NOTE — Telephone Encounter (Signed)
Ordered sertraline. I see in care everywhere that PCP ordered sertraline with 6 fills in Nov. Could you contact the pharmacy and cancel sertraline order from Dr. Neta Mends to avoid duplication? Thanks.

## 2020-01-31 NOTE — Telephone Encounter (Signed)
Medication refill request - Telephone message received from pt requesting a new Zoloft order.  Pt stated she saw her PCP today and they wanted Dr. Modesta Messing to take back over providing these medications. Pt stated they did it for her as a one time courtesy the previous month but now they want her to get a new order from Dr. Modesta Messing who she last met with on 01/16/20. Patient's last Zoloft order from Dr. Modesta Messing was 12/05/19 + 1 refill but she reports no remaining orders at her pharmacy under Dr. Modesta Messing.

## 2020-04-01 ENCOUNTER — Encounter: Payer: Self-pay | Admitting: Psychiatry

## 2020-04-01 ENCOUNTER — Other Ambulatory Visit: Payer: Self-pay

## 2020-04-01 ENCOUNTER — Telehealth (INDEPENDENT_AMBULATORY_CARE_PROVIDER_SITE_OTHER): Payer: Medicaid Other | Admitting: Psychiatry

## 2020-04-01 DIAGNOSIS — F330 Major depressive disorder, recurrent, mild: Secondary | ICD-10-CM | POA: Diagnosis not present

## 2020-04-01 DIAGNOSIS — F411 Generalized anxiety disorder: Secondary | ICD-10-CM | POA: Diagnosis not present

## 2020-04-01 MED ORDER — CLONAZEPAM 0.5 MG PO TABS: 0.5000 mg | ORAL_TABLET | Freq: Every day | ORAL | 2 refills | Status: DC | PRN

## 2020-04-01 MED ORDER — SERTRALINE HCL 100 MG PO TABS: 100.0000 mg | ORAL_TABLET | Freq: Every day | ORAL | 2 refills | Status: DC

## 2020-04-01 MED ORDER — BUPROPION HCL ER (XL) 150 MG PO TB24: 150.0000 mg | ORAL_TABLET | Freq: Every day | ORAL | 2 refills | Status: DC

## 2020-04-01 NOTE — Patient Instructions (Signed)
1. Continue sertraline100 mg daily  2. Continue bupropion 150 mg daily  3.Continueclonazepam 0.5 mg daily as needed, 15 tabs per month 4. Next appointment-5/20 at 11 A

## 2020-04-01 NOTE — Progress Notes (Signed)
Virtual Visit via Video Note  I connected with Angelica Lopez on 04/01/20 at  1:40 PM EST by a video enabled telemedicine application and verified that I am speaking with the correct person using two identifiers.  Location: Patient: home Provider: office Persons participated in the visit- patient, provider   I discussed the limitations of evaluation and management by telemedicine and the availability of in person appointments. The patient expressed understanding and agreed to proceed.    I discussed the assessment and treatment plan with the patient. The patient was provided an opportunity to ask questions and all were answered. The patient agreed with the plan and demonstrated an understanding of the instructions.   The patient was advised to call back or seek an in-person evaluation if the symptoms worsen or if the condition fails to improve as anticipated.  I provided 20 minutes of non-face-to-face time during this encounter.   [name], MD    [facility] MD/PA/NP OP Progress Note  04/01/2020 2:14 PM Angelica Lopez  MRN:  [id number]  Chief Complaint:  Chief Complaint    Follow-up; Depression     HPI:  This is a follow-up appointment for depression and anxiety.  She states that she has been having sore throat, and headache since this morning.  Her children were diagnosed with COVID the other day.  She is planning to see her provider.  She states that her mother's birthday was last Friday.  She has been feeling overwhelmed, and feel depressed.  She also states that her father was arrested due to violation of probation.  He lives on the street in [place], and hanging out in bars.  She is concerned about him as his disability was cut off.  She states that he is the only elderly person he has, although he acts like a younger person.  He reports good relationship with her children.  Her oldest daughter at home works at [employer].  Now that she has a current license, Angelica Lopez does not  need to bring her to the hospital.  She is hoping to get a job as well.  She has occasional insomnia.  She feels fatigue.  Although she reports decrease in appetite, she denies change in weight.  She denies SI.  She reports chronic back pain, and sees her spine specialist.  She prefers to stay on the medication as they are working well for the patient.  She denies alcohol use.  She uses marijuana once a week.    Daily routine:takes care of her children Employment:unemployed, using child support Support:neighbor Household:63year old daughter, and oldest daughter who works at [employer],  Marital status:Divorced in 2016, used to be married for 15 years Number of children:3, - 34 yo, 27 yo, middle one is at [employer] state  Visit Diagnosis:    ICD-10-CM   1. MDD (major depressive disorder), recurrent episode, mild ([place])  F33.0   2. GAD (generalized anxiety disorder)  F41.1     Past Psychiatric History: Please see initial evaluation for full details. I have reviewed the history. No updates at this time.     Past Medical History:  Past Medical History:  Diagnosis Date  . Hypertension   . Kyphosis   . Scheuermann's disease     Past Surgical History:  Procedure Laterality Date  . CESAREAN SECTION    . TONSILLECTOMY      Family Psychiatric History: Please see initial evaluation for full details. I have reviewed the history. No updates at this  time.     Family History:  Family History  Problem Relation Age of Onset  . Anxiety disorder Mother   . Depression Mother   . Hypertension Father   . Diabetes Father   . Anxiety disorder Father   . Depression Father   . Heart disease Maternal Grandfather        has had 3 open heart surgeries  . Febrile seizures Daughter   . Febrile seizures Daughter   . Febrile seizures Daughter     Social History:  Social History   Socioeconomic History  . Marital status: Legally Separated    Spouse name: Not on file  . Number of  children: 3  . Years of education: Not on file  . Highest education level: Not on file  Occupational History  . Not on file  Tobacco Use  . Smoking status: Current Every Day Smoker    Packs/day: 1.00    Years: 15.00    Pack years: 15.00    Types: Cigarettes  . Smokeless tobacco: Never Used  Vaping Use  . Vaping Use: Never used  Substance and Sexual Activity  . Alcohol use: No  . Drug use: Yes    Types: Marijuana    Comment: twice a week  . Sexual activity: Not Currently    Birth control/protection: I.U.D.  Other Topics Concern  . Not on file  Social History Narrative  . Not on file   Social Determinants of Health   Financial Resource Strain: High Risk  . Difficulty of Paying Living Expenses: Hard  Food Insecurity: Food Insecurity Present  . Worried About Charity fundraiser in the Last Year: Sometimes true  . Ran Out of Food in the Last Year: Sometimes true  Transportation Needs: No Transportation Needs  . Lack of Transportation (Medical): No  . Lack of Transportation (Non-Medical): No  Physical Activity: Inactive  . Days of Exercise per Week: 0 days  . Minutes of Exercise per Session: 0 min  Stress: Stress Concern Present  . Feeling of Stress : Very much  Social Connections: Moderately Integrated  . Frequency of Communication with Friends and Family: More than three times a week  . Frequency of Social Gatherings with Friends and Family: More than three times a week  . Attends Religious Services: 1 to 4 times per year  . Active Member of Clubs or Organizations: No  . Attends Archivist Meetings: 1 to 4 times per year  . Marital Status: Divorced    Allergies:  Allergies  Allergen Reactions  . Codeine Anaphylaxis    Reaction:Tongue swells    Metabolic Disorder Labs: No results found for: HGBA1C, MPG No results found for: PROLACTIN No results found for: CHOL, TRIG, HDL, CHOLHDL, VLDL, LDLCALC Lab Results  Component Value Date   TSH 1.81 12/06/2017    TSH 1.422 05/29/2009    Therapeutic Level Labs: No results found for: LITHIUM No results found for: VALPROATE No components found for:  CBMZ  Current Medications: Current Outpatient Medications  Medication Sig Dispense Refill  . buPROPion (WELLBUTRIN XL) 150 MG 24 hr tablet Take 1 tablet (150 mg total) by mouth daily. 30 tablet 2  . benzonatate (TESSALON) 100 MG capsule Take 1 capsule (100 mg total) by mouth every 8 (eight) hours. 30 capsule 0  . Buprenorphine HCl-Naloxone HCl (SUBOXONE) 8-2 MG FILM Place 3 tablets under the tongue daily.    . cetirizine (ZYRTEC ALLERGY) 10 MG tablet Take 1 tablet (10 mg total) by  mouth daily. 30 tablet 0  . cholecalciferol (VITAMIN D) 1000 UNITS tablet Take 1,000 Units by mouth daily. Take 2 tabs daily    . clonazePAM (KLONOPIN) 0.5 MG tablet Take 1 tablet (0.5 mg total) by mouth 2 (two) times daily as needed for up to 3 days for anxiety. 6 tablet 0  . [START ON 04/08/2020] clonazePAM (KLONOPIN) 0.5 MG tablet Take 1 tablet (0.5 mg total) by mouth daily as needed for anxiety (15 tabs per month). 15 tablet 2  . cloNIDine (CATAPRES) 0.1 MG tablet Take 0.1 mg by mouth daily.    . cyclobenzaprine (FLEXERIL) 10 MG tablet Take 10 mg by mouth 3 (three) times daily as needed.    . fluticasone (FLONASE) 50 MCG/ACT nasal spray Place 1 spray into both nostrils daily for 14 days. 16 g 0  . ibuprofen (ADVIL) 800 MG tablet Take 1 tablet (800 mg total) by mouth 3 (three) times daily. 21 tablet 0  . levonorgestrel (MIRENA) 20 MCG/24HR IUD 1 each by Intrauterine route once.    Marland Kitchen lisinopril (ZESTRIL) 20 MG tablet Take 20 mg by mouth daily.    . sertraline (ZOLOFT) 100 MG tablet Take 1 tablet (100 mg total) by mouth daily. 30 tablet 2   No current facility-administered medications for this visit.     Musculoskeletal: Strength & Muscle Tone: N/A Gait & Station: N/A Patient leans: N/A  Psychiatric Specialty Exam: Review of Systems  Psychiatric/Behavioral: Positive  for decreased concentration, dysphoric mood and sleep disturbance. Negative for agitation, behavioral problems, confusion, hallucinations, self-injury and suicidal ideas. The patient is nervous/anxious. The patient is not hyperactive.   All other systems reviewed and are negative.   There were no vitals taken for this visit.There is no height or weight on file to calculate BMI.  General Appearance: Fairly Groomed  Eye Contact:  Good  Speech:  Clear and Coherent  Volume:  Normal  Mood:  Depressed  Affect:  Appropriate, Congruent and down, restricted  Thought Process:  Coherent  Orientation:  Full (Time, Place, and Person)  Thought Content: Logical   Suicidal Thoughts:  No  Homicidal Thoughts:  No  Memory:  Immediate;   Good  Judgement:  Good  Insight:  Fair  Psychomotor Activity:  Normal  Concentration:  Concentration: Good and Attention Span: Good  Recall:  Good  Fund of Knowledge: Good  Language: Good  Akathisia:  No  Handed:  Right  AIMS (if indicated): not done  Assets:  Communication Skills Desire for Improvement  ADL's:  Intact  Cognition: WNL  Sleep:  Poor   Screenings: GAD-7   Flowsheet Row Office Visit from 07/17/2019 in [place]  Total GAD-7 Score 6    PHQ2-9   Flowsheet Row Video Visit from 04/01/2020 in [place] Office Visit from 07/17/2019 in [place] Visit from 01/10/2018 in [facility]  PHQ-2 Total Score 2 2 0  PHQ-9 Total Score 10 8 --    Flowsheet Row Video Visit from 04/01/2020 in [place] No Risk       Assessment and Plan:  Angelica Lopez is a 40 y.o. year old female with a history of  depression, anxiety, opioid use disorderin sustained remissionon Suboxone, who presents for follow up appointment for below.   1. MDD (major depressive disorder), recurrent episode, mild ([place]) 2. GAD (generalized anxiety disorder) Although she  reports slightly worsening in depression in the context of  birthday of her deceased mother last week.  Other psychosocial includes her father, who has been incarcerated, and loss of her family members, including her mother in April 2021.  Although she may benefit from up titration of bupropion, she prefers to stay on the same medication regimen.  Will continue sertraline to target depression and anxiety.  Will continue bupropion as adjunctive treatment for depression.  Will continue clonazepam as needed for anxiety/perceived hand tremors.  Discussed risk of dependence and oversedation, especially with concomitant use of Suboxone.  It was discussed at the initial visit that the dose will NOT be uptitrated in the future.   3. Opioid dependence in remission [facility]) She is on Suboxone,prescribed by PCP.Will continue motivational interview.  4. Cocaine abuse ([place]) Shedenies any cocaine use at least for several months.She is at precontemplative stage for marijuana use.We'll continue motivational interview.  Plan 1. Continue sertraline100 mg daily (worsening in fatigue at higher dose) 2. Continue bupropion 150 mg daily  3.Continueclonazepam 0.5 mg daily as needed, 15 tabs per month 4. Next appointment-5/20 at 11 AM for 30 mins, [email or web address] - Discussed attendance policy - she is on clonidine, prescribed by PCP  Past trials of medication:bupropion,Buspar, hydroxyzine,quetiapine/drowsiness,diazepam  The patient demonstrates the following risk factors for suicide: Chronic risk factors for suicide include:psychiatric disorder ofdepression, anxietyand substance use disorder. Acute risk factorsfor suicide include: family or marital conflict and unemployment. Protective factorsfor this patient include: responsibility to others (children, family), coping skills and hope for the future. Considering these factors, the overall suicide risk at this point appears to below.  Patientisappropriate for outpatient follow up.  [name], MD 04/01/2020, 2:14 PM

## 2020-04-09 ENCOUNTER — Telehealth: Payer: Medicaid Other | Admitting: Psychiatry

## 2020-05-03 DIAGNOSIS — R7303 Prediabetes: Secondary | ICD-10-CM | POA: Insufficient documentation

## 2020-06-25 NOTE — Progress Notes (Signed)
Virtual Visit via Video Note  I connected with Angelica Lopez on 06/28/20 at 11:00 AM EDT by a video enabled telemedicine application and verified that I am speaking with the correct person using two identifiers.  Location: Patient: home Provider: office Persons participated in the visit- patient, provider   I discussed the limitations of evaluation and management by telemedicine and the availability of in person appointments. The patient expressed understanding and agreed to proceed.    I discussed the assessment and treatment plan with the patient. The patient was provided an opportunity to ask questions and all were answered. The patient agreed with the plan and demonstrated an understanding of the instructions.   The patient was advised to call back or seek an in-person evaluation if the symptoms worsen or if the condition fails to improve as anticipated.  I provided 20 minutes of non-face-to-face time during this encounter.   Angelica Clay, MD    [facility] MD/PA/NP OP Progress Note  06/28/2020 11:37 AM Sinclaire PAISLEIGH KUBENA  MRN:  [id number]  Chief Complaint:  Chief Complaint    Follow-up; Depression; Anxiety     HPI:  This is a follow-up appointment for depression and anxiety.  She states that her anxiety was worse.  There was an anniversary of her mother passing away last month.  She also is having evaluation for her back pain.  She received steroid injection, and hopes that it works for her pain.  She was very anxious when she was found to have an elevated liver enzyme; she was found to have fatty liver.  She has been working on [employer].  She lost 4 pounds, and she feels good about it.  She is hoping to take a walk on sidewalks.  She reports occasional conflict with her children, who may be jealous about the youngest daughter.  She tends to feel down when she does not do anything.  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  Although she politely asks if her clonazepam could be  uptitrated, she understands that it will remain the same given she was recently started on gabapentin.  She agrees to discuss with her PCP whether she can try a lower dose given it can cause drowsiness, although it has been helping for her mood and insomnia.  She denies alcohol or cocaine use.  She uses marijuana once a week.  She has depressive symptoms as in PHQ-9.  She denies SI.   U/S on 5/2 IMPRESSION:  1. Hepatic steatosis.  2. Hepatomegaly  3. Uncomplicated cholelithiasis    Daily routine:takes care of her children Employment:unemployed, using child support Support:neighbor Household:39 yo old daughter in 50year   Marital status:Divorced in 2016, used to be married for 15 years Number of children:3, - 39 yo, 28 yo, middle one is at [employer] state  Visit Diagnosis:    ICD-10-CM   1. MDD (major depressive disorder), recurrent episode, mild ([place])  F33.0   2. GAD (generalized anxiety disorder)  F41.1     Past Psychiatric History: Please see initial evaluation for full details. I have reviewed the history. No updates at this time.     Past Medical History:  Past Medical History:  Diagnosis Date  . Hypertension   . Kyphosis   . Scheuermann's disease     Past Surgical History:  Procedure Laterality Date  . CESAREAN SECTION    . TONSILLECTOMY      Family Psychiatric History: Please see initial evaluation for full details. I have reviewed the history.  No updates at this time.     Family History:  Family History  Problem Relation Age of Onset  . Anxiety disorder Mother   . Depression Mother   . Hypertension Father   . Diabetes Father   . Anxiety disorder Father   . Depression Father   . Heart disease Maternal Grandfather        has had 3 open heart surgeries  . Febrile seizures Daughter   . Febrile seizures Daughter   . Febrile seizures Daughter     Social History:  Social History   Socioeconomic History  . Marital status: Legally Separated     Spouse name: Not on file  . Number of children: 3  . Years of education: Not on file  . Highest education level: Not on file  Occupational History  . Not on file  Tobacco Use  . Smoking status: Current Every Day Smoker    Packs/day: 1.00    Years: 15.00    Pack years: 15.00    Types: Cigarettes  . Smokeless tobacco: Never Used  Vaping Use  . Vaping Use: Never used  Substance and Sexual Activity  . Alcohol use: No  . Drug use: Yes    Types: Marijuana    Comment: twice a week  . Sexual activity: Not Currently    Birth control/protection: I.U.D.  Other Topics Concern  . Not on file  Social History Narrative  . Not on file   Social Determinants of Health   Financial Resource Strain: High Risk  . Difficulty of Paying Living Expenses: Hard  Food Insecurity: Food Insecurity Present  . Worried About Charity fundraiser in the Last Year: Sometimes true  . Ran Out of Food in the Last Year: Sometimes true  Transportation Needs: No Transportation Needs  . Lack of Transportation (Medical): No  . Lack of Transportation (Non-Medical): No  Physical Activity: Inactive  . Days of Exercise per Week: 0 days  . Minutes of Exercise per Session: 0 min  Stress: Stress Concern Present  . Feeling of Stress : Very much  Social Connections: Moderately Integrated  . Frequency of Communication with Friends and Family: More than three times a week  . Frequency of Social Gatherings with Friends and Family: More than three times a week  . Attends Religious Services: 1 to 4 times per year  . Active Member of Clubs or Organizations: No  . Attends Archivist Meetings: 1 to 4 times per year  . Marital Status: Divorced    Allergies:  Allergies  Allergen Reactions  . Codeine Anaphylaxis    Reaction:Tongue swells    Metabolic Disorder Labs: No results found for: HGBA1C, MPG No results found for: PROLACTIN No results found for: CHOL, TRIG, HDL, CHOLHDL, VLDL, LDLCALC Lab Results   Component Value Date   TSH 1.81 12/06/2017   TSH 1.422 05/29/2009    Therapeutic Level Labs: No results found for: LITHIUM No results found for: VALPROATE No components found for:  CBMZ  Current Medications: Current Outpatient Medications  Medication Sig Dispense Refill  . gabapentin (NEURONTIN) 300 MG capsule Take 300 mg by mouth at bedtime.    . benzonatate (TESSALON) 100 MG capsule Take 1 capsule (100 mg total) by mouth every 8 (eight) hours. 30 capsule 0  . Buprenorphine HCl-Naloxone HCl (SUBOXONE) 8-2 MG FILM Place 3 tablets under the tongue daily.    Marland Kitchen buPROPion (WELLBUTRIN XL) 150 MG 24 hr tablet Take 1 tablet (150 mg total) by  mouth daily. 30 tablet 2  . cetirizine (ZYRTEC ALLERGY) 10 MG tablet Take 1 tablet (10 mg total) by mouth daily. 30 tablet 0  . cholecalciferol (VITAMIN D) 1000 UNITS tablet Take 1,000 Units by mouth daily. Take 2 tabs daily    . [START ON 07/12/2020] clonazePAM (KLONOPIN) 0.5 MG tablet Take 1 tablet (0.5 mg total) by mouth daily as needed for anxiety (15 tabs per month). 15 tablet 2  . cloNIDine (CATAPRES) 0.1 MG tablet Take 0.1 mg by mouth daily.    . cyclobenzaprine (FLEXERIL) 10 MG tablet Take 10 mg by mouth 3 (three) times daily as needed.    . fluticasone (FLONASE) 50 MCG/ACT nasal spray Place 1 spray into both nostrils daily for 14 days. 16 g 0  . ibuprofen (ADVIL) 800 MG tablet Take 1 tablet (800 mg total) by mouth 3 (three) times daily. 21 tablet 0  . levonorgestrel (MIRENA) 20 MCG/24HR IUD 1 each by Intrauterine route once.    Marland Kitchen lisinopril (ZESTRIL) 20 MG tablet Take 20 mg by mouth daily.    . sertraline (ZOLOFT) 100 MG tablet Take 1 tablet (100 mg total) by mouth daily. 30 tablet 2   No current facility-administered medications for this visit.     Musculoskeletal: Strength & Muscle Tone: N/A Gait & Station: N/A Patient leans: N/A  Psychiatric Specialty Exam: Review of Systems  Psychiatric/Behavioral: Positive for dysphoric mood.  Negative for agitation, behavioral problems, confusion, decreased concentration, hallucinations, self-injury, sleep disturbance and suicidal ideas. The patient is nervous/anxious. The patient is not hyperactive.   All other systems reviewed and are negative.   There were no vitals taken for this visit.There is no height or weight on file to calculate BMI.  General Appearance: Fairly Groomed  Eye Contact:  Good  Speech:  Clear and Coherent  Volume:  Normal  Mood:  Anxious  Affect:  Appropriate, Congruent and euthymic  Thought Process:  Coherent  Orientation:  Full (Time, Place, and Person)  Thought Content: Logical   Suicidal Thoughts:  No  Homicidal Thoughts:  No  Memory:  Immediate;   Good  Judgement:  Good  Insight:  Good  Psychomotor Activity:  Normal  Concentration:  Concentration: Good and Attention Span: Good  Recall:  Good  Fund of Knowledge: Good  Language: Good  Akathisia:  No  Handed:  Right  AIMS (if indicated): not done  Assets:  Communication Skills Desire for Improvement  ADL's:  Intact  Cognition: WNL  Sleep:  Good   Screenings: GAD-7   Flowsheet Row Office Visit from 07/17/2019 in [place]  Total GAD-7 Score 6    PHQ2-9   Flowsheet Row Video Visit from 06/28/2020 in [place] Video Visit from 04/01/2020 in [place] Office Visit from 07/17/2019 in [place] Office Visit from 01/10/2018 in [facility]  PHQ-2 Total Score 2 2 2  0  PHQ-9 Total Score 3 10 8  --    Flowsheet Row Video Visit from 04/01/2020 in [place] CATEGORY No Risk       Assessment and Plan:  Angelica Lopez is a 40 y.o. year old female with a history of depression, anxiety, Chronic bilateral low back pain with bilateral sciatica, opioid use disorderin sustained remissionon Suboxone, Hepatic steatosis.  who presents for follow up appointment for below.    1. MDD (major depressive disorder), recurrent episode, mild ([place]) 2. GAD (generalized anxiety disorder) Although she reports  slight worsening in anxiety in the context of having medical condition, she has been handling things relatively well.  Psychosocial stressors includes anniversary of loss of her mother, who passed away in 06/05/2019, her father, who has been incarcerated.  Will continue current medication regimen.  Will continue sertraline to target depression and anxiety.  Will continue bupropion adjunctive treatment for depression.  Will continue clonazepam as needed for anxiety/perceived hand tremors.  Noted that she has been started on gabapentin by her PCP; she is aware of the risk of oversedation especially with concomitant use of Suboxone/gabapentin.  She agrees that dose of clonazepam will not be uptitrated in the future to avoid any risks.   3. Opioid dependence in remission [facility]) She is on Suboxone,prescribed by PCP.Will continue motivational interview.  4. Cocaine abuse ([place]) Shedenies any cocaine use at least for several months.She is at precontemplative stage for marijuana use.We'll continue motivational interview.  Plan I have reviewed and updated plans as below 1.Continue sertraline100 mg daily(worsening in fatigue at higher dose) 2. Continue bupropion 150 mg daily 3.Continueclonazepam 0.5 mg daily as needed, 15 tabs per month 4. Next appointment-8/17 at 11 AM for 30 mins, [email or web address] - gabapentin 300 mg at night (limited to take at night only as it causes drowsiness) - Discussed attendance policy - she is on clonidine, prescribed by PCP  Past trials of medication:bupropion,Buspar, hydroxyzine,quetiapine/drowsiness,diazepam  The patient demonstrates the following risk factors for suicide: Chronic risk factors for suicide include:psychiatric disorder ofdepression, anxietyand substance use disorder. Acute risk factorsfor  suicide include: family or marital conflict and unemployment. Protective factorsfor this patient include: responsibility to others (children, family), coping skills and hope for the future. Considering these factors, the overall suicide risk at this point appears to below. Patientisappropriate for outpatient follow up.  Angelica Clay, MD 06/28/2020, 11:37 AM

## 2020-06-28 ENCOUNTER — Encounter: Payer: Self-pay | Admitting: Psychiatry

## 2020-06-28 ENCOUNTER — Telehealth (INDEPENDENT_AMBULATORY_CARE_PROVIDER_SITE_OTHER): Payer: Medicaid Other | Admitting: Psychiatry

## 2020-06-28 ENCOUNTER — Other Ambulatory Visit: Payer: Self-pay

## 2020-06-28 DIAGNOSIS — F330 Major depressive disorder, recurrent, mild: Secondary | ICD-10-CM

## 2020-06-28 DIAGNOSIS — F411 Generalized anxiety disorder: Secondary | ICD-10-CM

## 2020-06-28 MED ORDER — BUPROPION HCL ER (XL) 150 MG PO TB24: 150.0000 mg | ORAL_TABLET | Freq: Every day | ORAL | 2 refills | Status: DC

## 2020-06-28 MED ORDER — SERTRALINE HCL 100 MG PO TABS: 100.0000 mg | ORAL_TABLET | Freq: Every day | ORAL | 2 refills | Status: DC

## 2020-06-28 MED ORDER — CLONAZEPAM 0.5 MG PO TABS: 0.5000 mg | ORAL_TABLET | Freq: Every day | ORAL | 2 refills | Status: DC | PRN

## 2020-06-28 NOTE — Patient Instructions (Addendum)
1.Continue sertraline100 mg daily 2. Continue bupropion 150 mg daily 3.Continueclonazepam 0.5 mg daily as needed, 15 tabs per month 4. Next appointment-8/17 at 11 AM

## 2020-07-17 ENCOUNTER — Other Ambulatory Visit: Payer: Medicaid Other | Admitting: Adult Health

## 2020-09-20 DIAGNOSIS — M51369 Other intervertebral disc degeneration, lumbar region without mention of lumbar back pain or lower extremity pain: Secondary | ICD-10-CM | POA: Insufficient documentation

## 2020-09-23 NOTE — Progress Notes (Signed)
Virtual Visit via Video Note  I connected with Angelica Lopez on 09/25/20 at 11:00 AM EDT by a video enabled telemedicine application and verified that I am speaking with the correct person using two identifiers.  Location: Patient: home Provider: office Persons participated in the visit- patient, provider    I discussed the limitations of evaluation and management by telemedicine and the availability of in person appointments. The patient expressed understanding and agreed to proceed.    I discussed the assessment and treatment plan with the patient. The patient was provided an opportunity to ask questions and all were answered. The patient agreed with the plan and demonstrated an understanding of the instructions.   The patient was advised to call back or seek an in-person evaluation if the symptoms worsen or if the condition fails to improve as anticipated.  I provided 17 minutes of non-face-to-face time during this encounter.   [name], MD    [facility] MD/PA/NP OP Progress Note  09/25/2020 11:21 AM [place]  MRN:  [id number]  Chief Complaint:  Chief Complaint   Follow-up; Depression    HPI:  This is a follow-up appointment for depression and anxiety.  She states that she is starting to have the back pain.  She took MRI, and was referred to see a spine specialist.  She does not want to undergo surgery.  The current situation makes her feel more anxious, and she has started to notice some tremors in her hands.  She takes clonazepam when it happens with good benefit.  She has been feeling stressed with financial strain.  She also talks about conflict with her oldest daughter.  Her father is back in jail as he failed to appear to court 8 times.  Although he wants her to bail him, she is not planning to do so as she does not want to put her name on it, although she feels guilty.  Her mother is on her mind every day, and she feels she does not have any family, referring to other  family members she lost.  She has depressive symptoms as in PHQ-9.  She feels anxious and tense at times.  She denies panic attacks.  She is willing to try higher dose of bupropion.    Daily routine: takes care of her children Employment: unemployed, using child support Support: neighbor Household:  84 yo old daughter in 2022  Marital status:  Divorced in 2016, used to be married for 15 years Number of children: 1, - 31 yo, 37 yo, middle one is at [employer] state   Visit Diagnosis:    ICD-10-CM   1. MDD (major depressive disorder), recurrent episode, mild ([place])  F33.0     2. GAD (generalized anxiety disorder)  F41.1       Past Psychiatric History: Please see initial evaluation for full details. I have reviewed the history. No updates at this time.     Past Medical History:  Past Medical History:  Diagnosis Date   Hypertension    Kyphosis    Scheuermann's disease     Past Surgical History:  Procedure Laterality Date   CESAREAN SECTION     TONSILLECTOMY      Family Psychiatric History: Please see initial evaluation for full details. I have reviewed the history. No updates at this time.     Family History:  Family History  Problem Relation Age of Onset   Anxiety disorder Mother    Depression Mother    Hypertension Father  Diabetes Father    Anxiety disorder Father    Depression Father    Heart disease Maternal Grandfather        has had 3 open heart surgeries   Febrile seizures Daughter    Febrile seizures Daughter    Febrile seizures Daughter     Social History:  Social History   Socioeconomic History   Marital status: Legally Separated    Spouse name: Not on file   Number of children: 3   Years of education: Not on file   Highest education level: Not on file  Occupational History   Not on file  Tobacco Use   Smoking status: Every Day    Packs/day: 1.00    Years: 15.00    Pack years: 15.00    Types: Cigarettes   Smokeless tobacco: Never   Vaping Use   Vaping Use: Never used  Substance and Sexual Activity   Alcohol use: No   Drug use: Yes    Types: Marijuana    Comment: twice a week   Sexual activity: Not Currently    Birth control/protection: I.U.D.  Other Topics Concern   Not on file  Social History Narrative   Not on file   Social Determinants of Health   Financial Resource Strain: Not on file  Food Insecurity: Not on file  Transportation Needs: Not on file  Physical Activity: Not on file  Stress: Not on file  Social Connections: Not on file    Allergies:  Allergies  Allergen Reactions   Codeine Anaphylaxis    Reaction:Tongue swells    Metabolic Disorder Labs: No results found for: HGBA1C, MPG No results found for: PROLACTIN No results found for: CHOL, TRIG, HDL, CHOLHDL, VLDL, LDLCALC Lab Results  Component Value Date   TSH 1.81 12/06/2017   TSH 1.422 05/29/2009    Therapeutic Level Labs: No results found for: LITHIUM No results found for: VALPROATE No components found for:  CBMZ  Current Medications: Current Outpatient Medications  Medication Sig Dispense Refill   benzonatate (TESSALON) 100 MG capsule Take 1 capsule (100 mg total) by mouth every 8 (eight) hours. 30 capsule 0   Buprenorphine HCl-Naloxone HCl (SUBOXONE) 8-2 MG FILM Place 3 tablets under the tongue daily.     buPROPion (WELLBUTRIN XL) 300 MG 24 hr tablet Take 1 tablet (300 mg total) by mouth daily. 30 tablet 2   cetirizine (ZYRTEC ALLERGY) 10 MG tablet Take 1 tablet (10 mg total) by mouth daily. 30 tablet 0   cholecalciferol (VITAMIN D) 1000 UNITS tablet Take 1,000 Units by mouth daily. Take 2 tabs daily     [START ON 10/11/2020] clonazePAM (KLONOPIN) 0.5 MG tablet Take 1 tablet (0.5 mg total) by mouth daily as needed for anxiety (15 tabs per month). 15 tablet 2   cloNIDine (CATAPRES) 0.1 MG tablet Take 0.1 mg by mouth daily.     cyclobenzaprine (FLEXERIL) 10 MG tablet Take 10 mg by mouth 3 (three) times daily as needed.      fluticasone (FLONASE) 50 MCG/ACT nasal spray Place 1 spray into both nostrils daily for 14 days. 16 g 0   gabapentin (NEURONTIN) 300 MG capsule Take 300 mg by mouth at bedtime.     ibuprofen (ADVIL) 800 MG tablet Take 1 tablet (800 mg total) by mouth 3 (three) times daily. 21 tablet 0   levonorgestrel (MIRENA) 20 MCG/24HR IUD 1 each by Intrauterine route once.     lisinopril (ZESTRIL) 20 MG tablet Take 20 mg by mouth  daily.     [START ON 09/27/2020] sertraline (ZOLOFT) 100 MG tablet Take 1 tablet (100 mg total) by mouth daily. 30 tablet 5   No current facility-administered medications for this visit.     Musculoskeletal: Strength & Muscle Tone:  N/A Gait & Station:  N/A Patient leans: N/A  Psychiatric Specialty Exam: Review of Systems  Psychiatric/Behavioral:  Positive for dysphoric mood and sleep disturbance. Negative for agitation, behavioral problems, confusion, decreased concentration, hallucinations, self-injury and suicidal ideas. The patient is nervous/anxious. The patient is not hyperactive.   All other systems reviewed and are negative.  There were no vitals taken for this visit.There is no height or weight on file to calculate BMI.  General Appearance: Fairly Groomed  Eye Contact:  Good  Speech:  Clear and Coherent  Volume:  Normal  Mood:  Depressed  Affect:  Appropriate, Congruent, and euthymic  Thought Process:  Coherent  Orientation:  Full (Time, Place, and Person)  Thought Content: Logical   Suicidal Thoughts:  No  Homicidal Thoughts:  No  Memory:  Immediate;   Good  Judgement:  Good  Insight:  Good  Psychomotor Activity:  Normal  Concentration:  Concentration: Good and Attention Span: Good  Recall:  Good  Fund of Knowledge: Good  Language: Good  Akathisia:  No  Handed:  Right  AIMS (if indicated): not done  Assets:  Communication Skills Desire for Improvement  ADL's:  Intact  Cognition: WNL  Sleep:  Fair   Screenings: GAD-7    [place] Office  Visit from 07/17/2019 in [place]  Total GAD-7 Score 6      PHQ2-9    Flowsheet Row Video Visit from 09/25/2020 in [place] Video Visit from 06/28/2020 in [facility] [place] Video Visit from 04/01/2020 in [place] Office Visit from 07/17/2019 in [place] Office Visit from 01/10/2018 in [facility]  PHQ-2 Total Score '2 2 2 2 '$ 0  PHQ-9 Total Score '6 3 10 8 '$ --      Flowsheet Row Video Visit from 04/01/2020 in [place] No Risk        Assessment and Plan:  Angelica Lopez is a 40 y.o. 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   1. MDD (major depressive disorder), recurrent episode, mild ([place]) 2. GAD (generalized anxiety disorder) She continues to report depressive symptoms in the context of conflict with her daughter, grief of loss of her mother, who passed away in 06/01/19, and her father, who is incarcerated again.  We uptitrate bupropion to optimize treatment for depression.  Discussed potential risk of headache.  She has no known history of seizure.  Will continue sertraline to target depression and anxiety.  Will continue clonazepam as needed for anxiety/perceived hand tremors.   Noted that she has been started on gabapentin by her PCP; she is aware of the risk of oversedation especially with concomitant use of Suboxone/gabapentin.  She agrees that dose of clonazepam will not be uptitrated in the future to avoid any risks.    3. Opioid dependence in remission [facility]) She is on Suboxone, prescribed by PCP.  Will continue motivational interview.    4. Cocaine abuse ([place]) She denies any cocaine use at least for several months. She  is  at precontemplative stage for marijuana use. We'll continue motivational interview.   This clinician has discussed the side effect associated with medication prescribed during this encounter. Please refer to notes in the previous encounters for more details.     Plan Corrington, Kip A, MD  1. Continue sertraline 100 mg daily (worsening in fatigue at higher dose) 2. Increase bupropion 300 mg daily  3. Continue clonazepam 0.5 mg daily as needed, 15 tabs per month 4. Next appointment-  11/14 at 1:40, video  [email or web address] - gabapentin 300 mg at night (limited to take at night only as it causes drowsiness) - Discussed attendance policy - she is on clonidine, prescribed by PCP   Past trials of medication: bupropion, Buspar, hydroxyzine, quetiapine/drowsiness, diazepam   The patient demonstrates the following risk factors for suicide: Chronic risk factors for suicide include: psychiatric disorder of depression, anxiety and substance use disorder. Acute risk factors for suicide include: family or marital conflict and unemployment. Protective factors for this patient include: responsibility to others (children, family), coping skills and hope for the future. Considering these factors, the overall suicide risk at this point appears to be low. Patient is appropriate for outpatient follow up.   [name], MD 09/25/2020, 11:21 AM

## 2020-09-25 ENCOUNTER — Telehealth (INDEPENDENT_AMBULATORY_CARE_PROVIDER_SITE_OTHER): Payer: Medicaid Other | Admitting: Psychiatry

## 2020-09-25 ENCOUNTER — Other Ambulatory Visit: Payer: Self-pay

## 2020-09-25 ENCOUNTER — Encounter: Payer: Self-pay | Admitting: Psychiatry

## 2020-09-25 DIAGNOSIS — F411 Generalized anxiety disorder: Secondary | ICD-10-CM | POA: Diagnosis not present

## 2020-09-25 DIAGNOSIS — F330 Major depressive disorder, recurrent, mild: Secondary | ICD-10-CM | POA: Diagnosis not present

## 2020-09-25 MED ORDER — CLONAZEPAM 0.5 MG PO TABS: 0.5000 mg | ORAL_TABLET | Freq: Every day | ORAL | 2 refills | Status: DC | PRN

## 2020-09-25 MED ORDER — SERTRALINE HCL 100 MG PO TABS: 100.0000 mg | ORAL_TABLET | Freq: Every day | ORAL | 5 refills | Status: DC

## 2020-09-25 MED ORDER — BUPROPION HCL ER (XL) 300 MG PO TB24: 300.0000 mg | ORAL_TABLET | Freq: Every day | ORAL | 2 refills | Status: DC

## 2020-09-25 NOTE — Patient Instructions (Signed)
1. Continue sertraline 100 mg daily  2. Increase bupropion 300 mg daily  3. Continue clonazepam 0.5 mg daily as needed, 15 tabs per month 4. Next appointment-  11/14 at 1:40

## 2020-11-09 ENCOUNTER — Ambulatory Visit
Admission: EM | Admit: 2020-11-09 | Discharge: 2020-11-09 | Disposition: A | Discharge status: Home / Self Care | Payer: Medicaid Other | Attending: Family Medicine | Admitting: Family Medicine

## 2020-11-09 ENCOUNTER — Encounter: Payer: Self-pay | Admitting: Emergency Medicine

## 2020-11-09 DIAGNOSIS — J019 Acute sinusitis, unspecified: Secondary | ICD-10-CM

## 2020-11-09 DIAGNOSIS — Z1152 Encounter for screening for COVID-19: Secondary | ICD-10-CM | POA: Diagnosis not present

## 2020-11-09 DIAGNOSIS — J209 Acute bronchitis, unspecified: Secondary | ICD-10-CM

## 2020-11-09 MED ORDER — PROMETHAZINE-DM 6.25-15 MG/5ML PO SYRP: 5.0000 mL | ORAL_SOLUTION | Freq: Four times a day (QID) | ORAL | 0 refills | Status: DC | PRN

## 2020-11-09 MED ORDER — AMOXICILLIN 875 MG PO TABS: 875.0000 mg | ORAL_TABLET | Freq: Two times a day (BID) | ORAL | 0 refills | Status: DC

## 2020-11-09 NOTE — Discharge Instructions (Addendum)
  Your COVID 19 results should result within 2-3 days. Negative results are immediately resulted to Mychart. Positive results will receive a follow-up call from our clinic. If symptoms are present, I recommend home quarantine until results are known.  Alternate Tylenol and ibuprofen as needed for body aches and fever.  Symptom management per recommendations discussed today.  If any breathing difficulty or chest pain develops go immediately to the closest emergency department for evaluation.
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
## 2020-11-09 NOTE — ED Triage Notes (Signed)
Pt presents today with c/o nasal congestion, ear fullness (bilateral) and cough x 14 days. Denies fever. Home Covid test today=neg. Requests Covid PCR test today.

## 2020-11-10 LAB — COVID-19, FLU A+B NAA
Influenza A, NAA: NOT DETECTED
Influenza B, NAA: NOT DETECTED
SARS-CoV-2, NAA: NOT DETECTED

## 2020-12-18 NOTE — Progress Notes (Signed)
Virtual Visit via Video Note  I connected with Angelica Lopez on 12/23/20 at  1:40 PM EST by a video enabled telemedicine application and verified that I am speaking with the correct person using two identifiers.  Location: Patient: home Provider: office Persons participated in the visit- patient, provider    I discussed the limitations of evaluation and management by telemedicine and the availability of in person appointments. The patient expressed understanding and agreed to proceed.   I discussed the assessment and treatment plan with the patient. The patient was provided an opportunity to ask questions and all were answered. The patient agreed with the plan and demonstrated an understanding of the instructions.   The patient was advised to call back or seek an in-person evaluation if the symptoms worsen or if the condition fails to improve as anticipated.  I provided 10 minutes of non-face-to-face time during this encounter.   [name], MD    [facility] MD/PA/NP OP Progress Note  12/23/2020 2:06 PM Angelica Lopez  MRN:  [id number]  Chief Complaint:  Chief Complaint   Follow-up; Depression; Anxiety    HPI:  This is a follow-up appointment for depression and anxiety.  She states that she stays depressed, although higher dose of bupropion has been helpful.  She talks about ongoing conflict with her oldest daughter.  She also talks about the stress against her father, who was incarcerated.  She reports great relationship with her youngest daughter.  She tends to feel more depressed when she was not at home/recent trip outside of the state.  She feels like her life is blah.  She is thinking about trying to find a part-time job as it would be difficult for her to get disability.  She has fair sleep.  She has been trying to eat healthy food.  She has fair concentration.  She has fair energy, and takes good care of her daughter.  She denies SI.  She feels anxious, tense and has occasional  panic attacks.  She uses marijuana once a week; she cut down from twice a week.  She denies any other substance use.  She feels comfortable to stay on the current medication regimen.   Daily routine: takes care of her children Employment: unemployed, using child support Support: neighbor Household:  59 yo old daughter in 2022  Marital status:  Divorced in 2016, used to be married for 15 years Number of children: 10, - 17 yo, 56 yo, middle one is at [employer] state  Visit Diagnosis:    ICD-10-CM   1. MDD (major depressive disorder), recurrent episode, mild ([place])  F33.0     2. GAD (generalized anxiety disorder)  F41.1       Past Psychiatric History: Please see initial evaluation for full details. I have reviewed the history. No updates at this time.     Past Medical History:  Past Medical History:  Diagnosis Date   Hypertension    Kyphosis    Scheuermann's disease     Past Surgical History:  Procedure Laterality Date   CESAREAN SECTION     TONSILLECTOMY      Family Psychiatric History: Please see initial evaluation for full details. I have reviewed the history. No updates at this time.     Family History:  Family History  Problem Relation Age of Onset   Anxiety disorder Mother    Depression Mother    Hypertension Father    Diabetes Father    Anxiety disorder Father  Depression Father    Heart disease Maternal Grandfather        has had 3 open heart surgeries   Febrile seizures Daughter    Febrile seizures Daughter    Febrile seizures Daughter     Social History:  Social History   Socioeconomic History   Marital status: Legally Separated    Spouse name: Not on file   Number of children: 3   Years of education: Not on file   Highest education level: Not on file  Occupational History   Not on file  Tobacco Use   Smoking status: Every Day    Packs/day: 1.00    Years: 15.00    Pack years: 15.00    Types: Cigarettes   Smokeless tobacco: Never  Vaping  Use   Vaping Use: Never used  Substance and Sexual Activity   Alcohol use: No   Drug use: Yes    Types: Marijuana    Comment: twice a week   Sexual activity: Not Currently    Birth control/protection: I.U.D.  Other Topics Concern   Not on file  Social History Narrative   Not on file   Social Determinants of Health   Financial Resource Strain: Not on file  Food Insecurity: Not on file  Transportation Needs: Not on file  Physical Activity: Not on file  Stress: Not on file  Social Connections: Not on file    Allergies:  Allergies  Allergen Reactions   Codeine Anaphylaxis    Reaction:Tongue swells    Metabolic Disorder Labs: No results found for: HGBA1C, MPG No results found for: PROLACTIN No results found for: CHOL, TRIG, HDL, CHOLHDL, VLDL, LDLCALC Lab Results  Component Value Date   TSH 1.81 12/06/2017   TSH 1.422 05/29/2009    Therapeutic Level Labs: No results found for: LITHIUM No results found for: VALPROATE No components found for:  CBMZ  Current Medications: Current Outpatient Medications  Medication Sig Dispense Refill   amoxicillin (AMOXIL) 875 MG tablet Take 1 tablet (875 mg total) by mouth 2 (two) times daily. 20 tablet 0   benzonatate (TESSALON) 100 MG capsule Take 1 capsule (100 mg total) by mouth every 8 (eight) hours. 30 capsule 0   Buprenorphine HCl-Naloxone HCl (SUBOXONE) 8-2 MG FILM Place 3 tablets under the tongue daily.     [START ON 12/25/2020] buPROPion (WELLBUTRIN XL) 300 MG 24 hr tablet Take 1 tablet (300 mg total) by mouth daily. 30 tablet 2   cetirizine (ZYRTEC ALLERGY) 10 MG tablet Take 1 tablet (10 mg total) by mouth daily. 30 tablet 0   cholecalciferol (VITAMIN D) 1000 UNITS tablet Take 1,000 Units by mouth daily. Take 2 tabs daily     [START ON 01/07/2021] clonazePAM (KLONOPIN) 0.5 MG tablet Take 1 tablet (0.5 mg total) by mouth daily as needed for anxiety (15 tabs per month). 15 tablet 2   cloNIDine (CATAPRES) 0.1 MG tablet Take 0.1  mg by mouth daily.     cyclobenzaprine (FLEXERIL) 10 MG tablet Take 10 mg by mouth 3 (three) times daily as needed.     fluticasone (FLONASE) 50 MCG/ACT nasal spray Place 1 spray into both nostrils daily for 14 days. 16 g 0   gabapentin (NEURONTIN) 300 MG capsule Take 300 mg by mouth at bedtime.     ibuprofen (ADVIL) 800 MG tablet Take 1 tablet (800 mg total) by mouth 3 (three) times daily. 21 tablet 0   levonorgestrel (MIRENA) 20 MCG/24HR IUD 1 each by Intrauterine route once.  lisinopril (ZESTRIL) 20 MG tablet Take 20 mg by mouth daily.     promethazine-dextromethorphan (PROMETHAZINE-DM) 6.25-15 MG/5ML syrup Take 5 mLs by mouth 4 (four) times daily as needed for cough (cough and congestion). 140 mL 0   sertraline (ZOLOFT) 100 MG tablet Take 1 tablet (100 mg total) by mouth daily. 30 tablet 5   No current facility-administered medications for this visit.     Musculoskeletal: Strength & Muscle Tone:  N/A Gait & Station:  N/A Patient leans: N/A  Psychiatric Specialty Exam: Review of Systems  Psychiatric/Behavioral:  Positive for decreased concentration and dysphoric mood. Negative for agitation, behavioral problems, confusion, hallucinations, self-injury, sleep disturbance and suicidal ideas. The patient is nervous/anxious. The patient is not hyperactive.   All other systems reviewed and are negative.  There were no vitals taken for this visit.There is no height or weight on file to calculate BMI.  General Appearance: Fairly Groomed  Eye Contact:  Good  Speech:  Clear and Coherent  Volume:  Normal  Mood:  Depressed  Affect:  Appropriate, Congruent, and calm  Thought Process:  Coherent  Orientation:  Full (Time, Place, and Person)  Thought Content: Logical   Suicidal Thoughts:  No  Homicidal Thoughts:  No  Memory:  Immediate;   Good  Judgement:  Good  Insight:  Fair  Psychomotor Activity:  Normal  Concentration:  Concentration: Good and Attention Span: Good  Recall:  Good   Fund of Knowledge: Good  Language: Good  Akathisia:  No  Handed:  Right  AIMS (if indicated): not done  Assets:  Communication Skills Desire for Improvement  ADL's:  Intact  Cognition: WNL  Sleep:  Fair   Screenings: GAD-7    [place] Office Visit from 07/17/2019 in [place]  Total GAD-7 Score 6      PHQ2-9    Flowsheet Row Video Visit from 09/25/2020 in [place] Video Visit from 06/28/2020 in [place] Video Visit from 04/01/2020 in [place] Office Visit from 07/17/2019 in [place] Office Visit from 01/10/2018 in [facility]  PHQ-2 Total Score 2 2 2 2  0  PHQ-9 Total Score 6 3 10 8  --      Flowsheet Row ED from 11/09/2020 in [facility] [place] Video Visit from 04/01/2020 in [place] No Risk No Risk        Assessment and Plan:  Angelica Lopez is a 40 y.o. 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   1. MDD (major depressive disorder), recurrent episode, mild ([place]) 2. GAD (generalized anxiety disorder) Although she reports mild worsening in depressive symptoms and an anxiety in the context of grief of loss of her mother around the holiday, and conflict with her oldest daughter, she has been hunching since relatively well.  Other psychosocial stressors includes her father, who was incarcerated again.  Will continue current dose of sertraline to target depression and anxiety.  Will continue bupropion adjunctive treatment for depression.  Will continue clonazepam as needed for anxiety/perceived hand tremors.   3. Opioid dependence in remission [facility]) She is on Suboxone, prescribed by PCP.   Will continue motivational interview.    4. Cocaine abuse ([place]) Although she denies cocaine use, she continues to  use marijuana.  Will continue motivational interview.   Plan I have reviewed and updated plans as below  1. Continue sertraline 100 mg daily (worsening in fatigue at higher dose) 2. Continue bupropion 300 mg daily  3. Continue clonazepam 0.5 mg daily as needed, 15 tabs per month 4. Next appointment-  2/13 at 1 PM for 20 mins, video  [email or web address] - gabapentin 300 mg at night (limited to take at night only as it causes drowsiness) - Discussed attendance policy - she is on clonidine, prescribed by PCP   Past trials of medication: bupropion, Buspar, hydroxyzine, quetiapine/drowsiness, diazepam   The patient demonstrates the following risk factors for suicide: Chronic risk factors for suicide include: psychiatric disorder of depression, anxiety and substance use disorder. Acute risk factors for suicide include: family or marital conflict and unemployment. Protective factors for this patient include: responsibility to others (children, family), coping skills and hope for the future. Considering these factors, the overall suicide risk at this point appears to be low. Patient is appropriate for outpatient follow up.    [name], MD 12/23/2020, 2:06 PM

## 2020-12-23 ENCOUNTER — Encounter: Payer: Self-pay | Admitting: Psychiatry

## 2020-12-23 ENCOUNTER — Other Ambulatory Visit: Payer: Self-pay

## 2020-12-23 ENCOUNTER — Telehealth (INDEPENDENT_AMBULATORY_CARE_PROVIDER_SITE_OTHER): Payer: Medicaid Other | Admitting: Psychiatry

## 2020-12-23 DIAGNOSIS — F411 Generalized anxiety disorder: Secondary | ICD-10-CM | POA: Diagnosis not present

## 2020-12-23 DIAGNOSIS — F330 Major depressive disorder, recurrent, mild: Secondary | ICD-10-CM

## 2020-12-23 MED ORDER — BUPROPION HCL ER (XL) 300 MG PO TB24: 300.0000 mg | ORAL_TABLET | Freq: Every day | ORAL | 2 refills | Status: DC

## 2020-12-23 MED ORDER — CLONAZEPAM 0.5 MG PO TABS: 0.5000 mg | ORAL_TABLET | Freq: Every day | ORAL | 2 refills | Status: DC | PRN

## 2020-12-23 NOTE — Patient Instructions (Signed)
1. Continue sertraline 100 mg daily 2. Continue bupropion 300 mg daily  3. Continue clonazepam 0.5 mg daily as needed, 15 tabs per month 4. Next appointment-  2/13 at 1 PM

## 2021-03-07 DIAGNOSIS — R251 Tremor, unspecified: Secondary | ICD-10-CM | POA: Insufficient documentation

## 2021-03-07 IMAGING — DX DG FOOT COMPLETE 3+V*L*
3 series · 3 of 3 positions shown · non-contrast
Comparison: None.

CLINICAL DATA: [place] states that [place] and is now having left foot pain, pain along
lateral side of foot.

EXAM:
LEFT FOOT - COMPLETE 3+ VIEW

[foot ap]
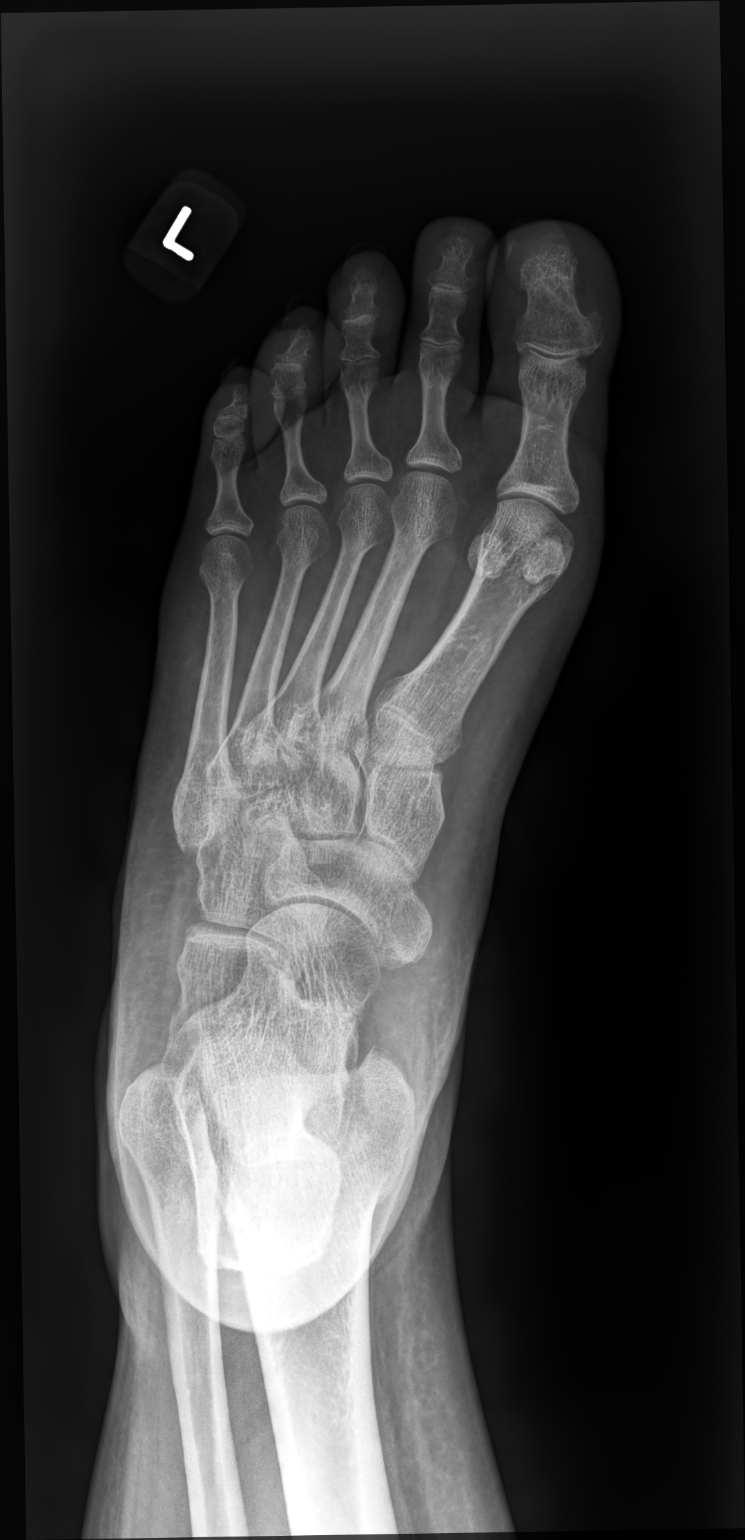

[foot mlo]
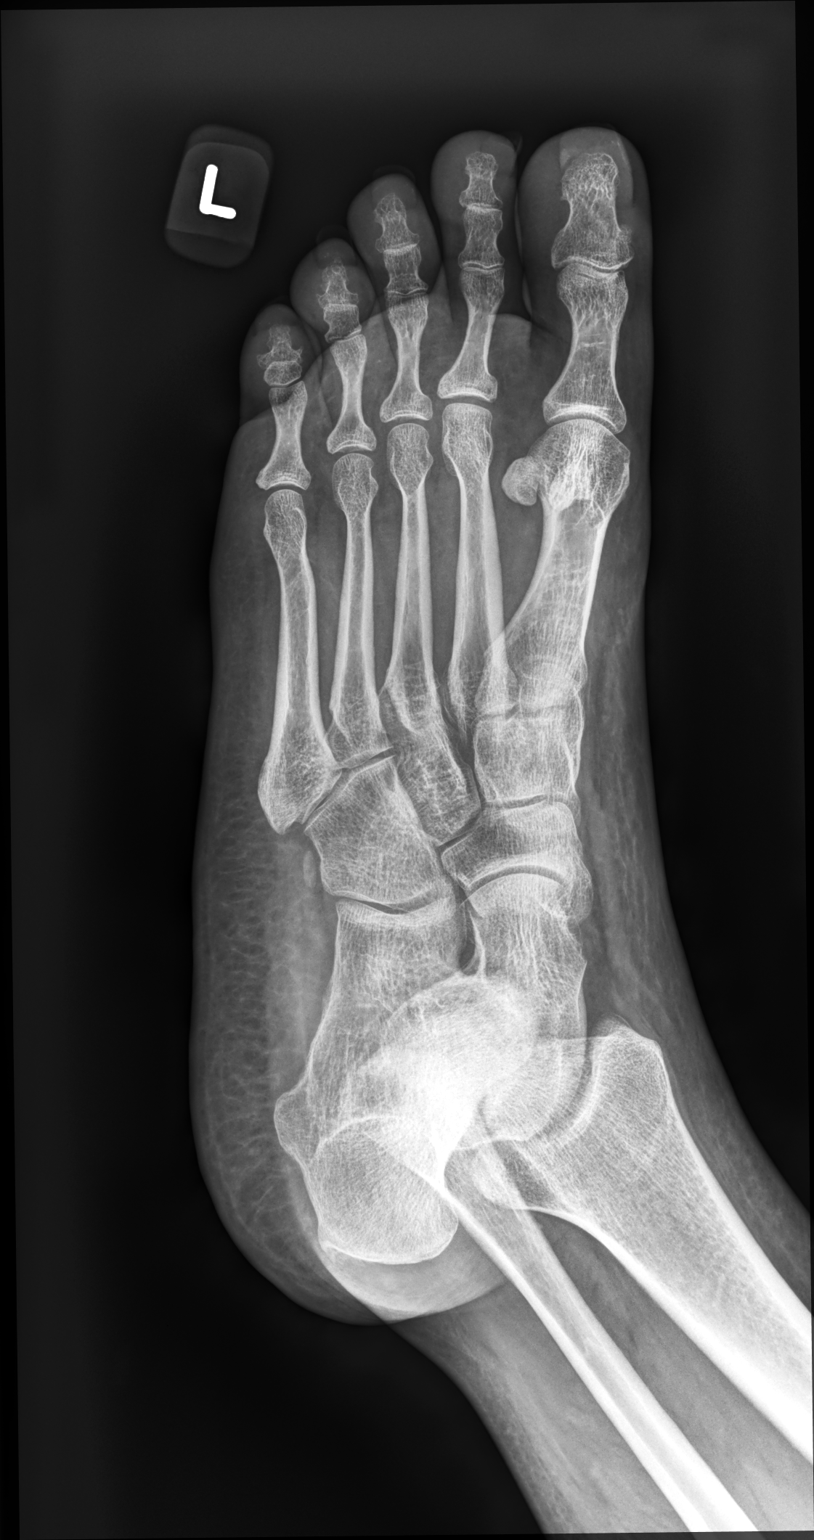

[foot lat]
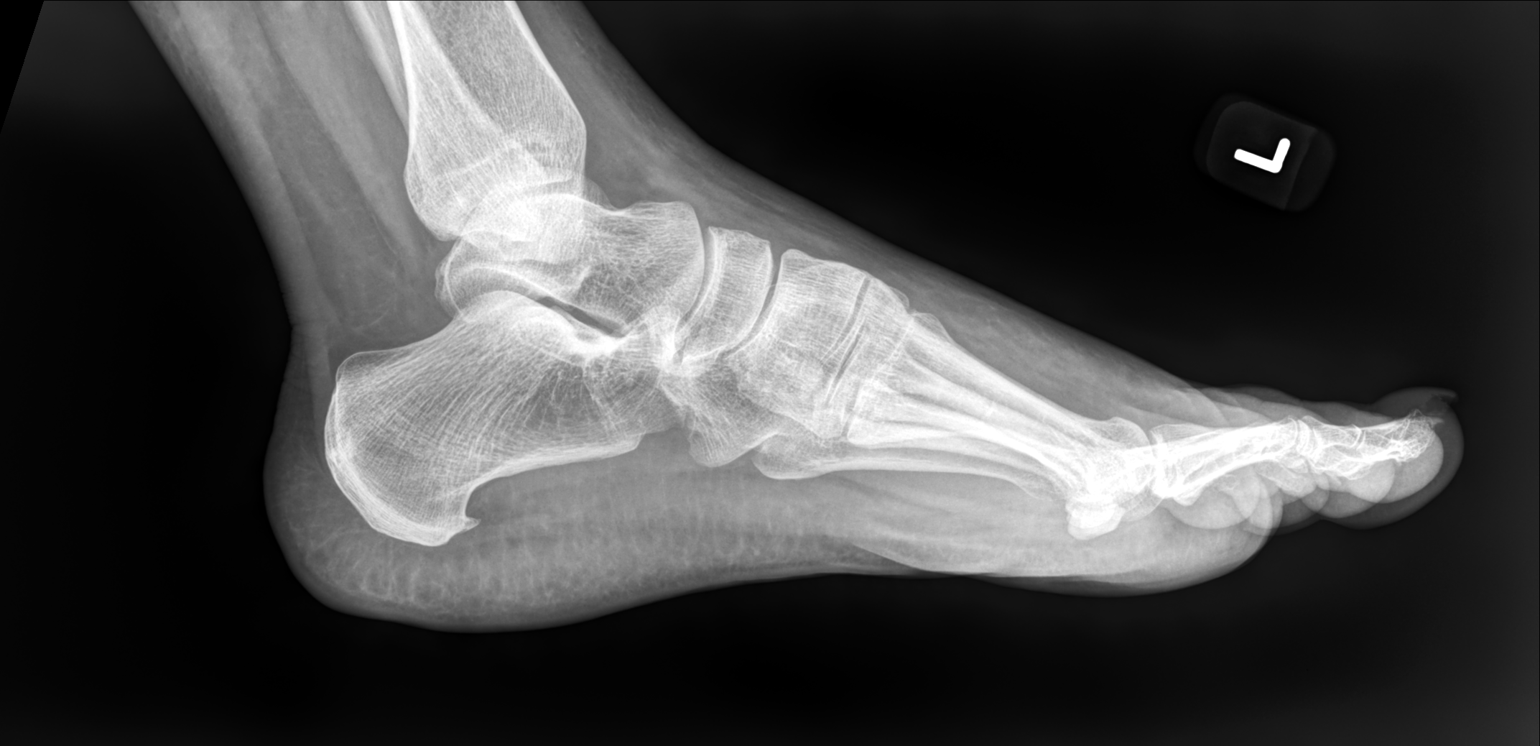

[3 of 3 positions shown; findings below may reference images not displayed]

FINDINGS: There is no evidence of fracture or dislocation. There is a small
well corticated os ossific density adjacent to the cuboid which
likely represents sequela of prior remote injury. Soft tissues are
unremarkable.
IMPRESSION: 1.  No acute osseous abnormality in the left foot.

2. Small ossicle adjacent to the cuboid favored to represent sequela
of remote prior injury.

## 2021-03-20 NOTE — Progress Notes (Signed)
Virtual Visit via Video Note  I connected with Angelica Lopez on 03/24/21 at  1:00 PM EST by a video enabled telemedicine application and verified that I am speaking with the correct person using two identifiers.  Location: Patient: home Provider: office Persons participated in the visit- patient, provider    I discussed the limitations of evaluation and management by telemedicine and the availability of in person appointments. The patient expressed understanding and agreed to proceed.   I discussed the assessment and treatment plan with the patient. The patient was provided an opportunity to ask questions and all were answered. The patient agreed with the plan and demonstrated an understanding of the instructions.   The patient was advised to call back or seek an in-person evaluation if the symptoms worsen or if the condition fails to improve as anticipated.  I provided 20 minutes of non-face-to-face time during this encounter.   [name], MD    [facility] MD/PA/NP OP Progress Note  03/24/2021 1:37 PM Angelica Lopez  MRN:  [id number]  Chief Complaint:  Chief Complaint   Follow-up; Depression    HPI:  - per chart review, she was started on propranolol 10 mg three times a day for hand tremors This is a follow-up appointment for depression and anxiety.  She states that her father was arrested due to trespassing yesterday.  It was embarrassing for her as she was contacted by the store manager that he was banned from the store.  She was able to sleep very good as he was in jail.  She states that she used to be worried, wondering if he is in her porch.  She had an okay holiday.  She enjoyed the time with her daughter, who is in college.  She reports better relationship with her oldest daughter.  She states that the birthday of her mother is in Feb. although she used to be more anxious, she thinks BuSpar, which was prescribed by her PCP has been helping her.  She takes this medication only  as needed.  She has depressive symptoms as in PHQ-9, which she attributes to the stress of her father.   She denies SI.  She denies alcohol use.  She uses marijuana occasionally for anxiety. She believes her medication has been helping her so that she does not stay depressed.  She feels comfortable to stay on the current medication regimen.   Daily routine: takes care of her children Employment: unemployed, using child support Support: neighbor Household:  36 yo old daughter in 2022  Marital status:  Divorced in 2016, used to be married for 15 years Number of children: 64, - 31 yo, 19 yo, middle one is at [employer] state  Visit Diagnosis:    ICD-10-CM   1. MDD (major depressive disorder), recurrent, in partial remission ([place])  F33.41     2. GAD (generalized anxiety disorder)  F41.1       Past Psychiatric History: Please see initial evaluation for full details. I have reviewed the history. No updates at this time.     Past Medical History:  Past Medical History:  Diagnosis Date   Hypertension    Kyphosis    Scheuermann's disease     Past Surgical History:  Procedure Laterality Date   CESAREAN SECTION     TONSILLECTOMY      Family Psychiatric History: Please see initial evaluation for full details. I have reviewed the history. No updates at this time.     Family History:  Family History  Problem Relation Age of Onset   Anxiety disorder Mother    Depression Mother    Hypertension Father    Diabetes Father    Anxiety disorder Father    Depression Father    Heart disease Maternal Grandfather        has had 3 open heart surgeries   Febrile seizures Daughter    Febrile seizures Daughter    Febrile seizures Daughter     Social History:  Social History   Socioeconomic History   Marital status: Legally Separated    Spouse name: Not on file   Number of children: 3   Years of education: Not on file   Highest education level: Not on file  Occupational History   Not  on file  Tobacco Use   Smoking status: Every Day    Packs/day: 1.00    Years: 15.00    Pack years: 15.00    Types: Cigarettes   Smokeless tobacco: Never  Vaping Use   Vaping Use: Never used  Substance and Sexual Activity   Alcohol use: No   Drug use: Yes    Types: Marijuana    Comment: twice a week   Sexual activity: Not Currently    Birth control/protection: I.U.D.  Other Topics Concern   Not on file  Social History Narrative   Not on file   Social Determinants of Health   Financial Resource Strain: Not on file  Food Insecurity: Not on file  Transportation Needs: Not on file  Physical Activity: Not on file  Stress: Not on file  Social Connections: Not on file    Allergies:  Allergies  Allergen Reactions   Codeine Anaphylaxis    Reaction:Tongue swells    Metabolic Disorder Labs: No results found for: HGBA1C, MPG No results found for: PROLACTIN No results found for: CHOL, TRIG, HDL, CHOLHDL, VLDL, LDLCALC Lab Results  Component Value Date   TSH 1.81 12/06/2017   TSH 1.422 05/29/2009    Therapeutic Level Labs: No results found for: LITHIUM No results found for: VALPROATE No components found for:  CBMZ  Current Medications: Current Outpatient Medications  Medication Sig Dispense Refill   busPIRone (BUSPAR) 10 MG tablet Take 10 mg by mouth 2 (two) times daily.     amoxicillin (AMOXIL) 875 MG tablet Take 1 tablet (875 mg total) by mouth 2 (two) times daily. 20 tablet 0   benzonatate (TESSALON) 100 MG capsule Take 1 capsule (100 mg total) by mouth every 8 (eight) hours. 30 capsule 0   Buprenorphine HCl-Naloxone HCl (SUBOXONE) 8-2 MG FILM Place 3 tablets under the tongue daily.     [START ON 03/26/2021] buPROPion (WELLBUTRIN XL) 300 MG 24 hr tablet Take 1 tablet (300 mg total) by mouth daily. 30 tablet 2   cetirizine (ZYRTEC ALLERGY) 10 MG tablet Take 1 tablet (10 mg total) by mouth daily. 30 tablet 0   cholecalciferol (VITAMIN D) 1000 UNITS tablet Take 1,000  Units by mouth daily. Take 2 tabs daily     [START ON 04/06/2021] clonazePAM (KLONOPIN) 0.5 MG tablet Take 1 tablet (0.5 mg total) by mouth daily as needed for anxiety (15 tabs per month). 15 tablet 2   cloNIDine (CATAPRES) 0.1 MG tablet Take 0.1 mg by mouth daily.     cyclobenzaprine (FLEXERIL) 10 MG tablet Take 10 mg by mouth 3 (three) times daily as needed.     fluticasone (FLONASE) 50 MCG/ACT nasal spray Place 1 spray into both nostrils daily for 14  days. 16 g 0   gabapentin (NEURONTIN) 300 MG capsule Take 300 mg by mouth at bedtime.     ibuprofen (ADVIL) 800 MG tablet Take 1 tablet (800 mg total) by mouth 3 (three) times daily. 21 tablet 0   levonorgestrel (MIRENA) 20 MCG/24HR IUD 1 each by Intrauterine route once.     lisinopril (ZESTRIL) 20 MG tablet Take 20 mg by mouth daily.     promethazine-dextromethorphan (PROMETHAZINE-DM) 6.25-15 MG/5ML syrup Take 5 mLs by mouth 4 (four) times daily as needed for cough (cough and congestion). 140 mL 0   [START ON 03/27/2021] sertraline (ZOLOFT) 100 MG tablet Take 1 tablet (100 mg total) by mouth daily. 30 tablet 5   No current facility-administered medications for this visit.     Musculoskeletal: Strength & Muscle Tone:  N/A Gait & Station:  N/A Patient leans: N/A  Psychiatric Specialty Exam: Review of Systems  Psychiatric/Behavioral:  Positive for dysphoric mood and sleep disturbance. Negative for agitation, behavioral problems, confusion, decreased concentration, hallucinations, self-injury and suicidal ideas. The patient is nervous/anxious. The patient is not hyperactive.   All other systems reviewed and are negative.  There were no vitals taken for this visit.There is no height or weight on file to calculate BMI.  General Appearance: Fairly Groomed  Eye Contact:  Good  Speech:  Clear and Coherent  Volume:  Normal  Mood:   good  Affect:  Appropriate, Congruent, and calm  Thought Process:  Coherent  Orientation:  Full (Time, Place, and  Person)  Thought Content: Logical   Suicidal Thoughts:  No  Homicidal Thoughts:  No  Memory:  Immediate;   Good  Judgement:  Good  Insight:  Good  Psychomotor Activity:  Normal  Concentration:  Concentration: Good and Attention Span: Good  Recall:  Good  Fund of Knowledge: Good  Language: Good  Akathisia:  No  Handed:  Right  AIMS (if indicated): not done  Assets:  Communication Skills Desire for Improvement  ADL's:  Intact  Cognition: WNL  Sleep:  Fair   Screenings: GAD-7    [place] Office Visit from 07/17/2019 in [place]  Total GAD-7 Score 6      PHQ2-9    Flowsheet Row Video Visit from 03/24/2021 in [place] Video Visit from 09/25/2020 in [place] Video Visit from 06/28/2020 in [place] Video Visit from 04/01/2020 in [place] Office Visit from 07/17/2019 in [place]  PHQ-2 Total Score 2 2 2 2 2   PHQ-9 Total Score 4 6 3 10 8       Flowsheet Row ED from 11/09/2020 in [facility] [place] Video Visit from 04/01/2020 in [place] No Risk No Risk        Assessment and Plan:  HALY FEHER is a 41 y.o. 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    1. MDD (major depressive disorder), recurrent episode, in partial remission ([place]) 2. GAD (generalized anxiety disorder) There has been overall improvement in depression and anxiety since the last visit.  Recent psychosocial stressors includes her father, who was incarcerated yesterday, grief of loss of her mother, and occasional conflict with her oldest daughter.  Will continue current dose of sertraline to target  depression and anxiety.  Will continue bupropion as adjunctive treatment for depression.  Will continue clonazepam as needed for anxiety.  Noted that she was reportedly started on BuSpar with good benefit, although this information was not available in the chart.  Will continue to monitor.    3. Opioid dependence in remission [facility]) She is on Suboxone, prescribed by PCP.  Will continue motivational interview.    4. Cocaine abuse ([place]) # Marijuana use She is at pre contemplative stage for marijuana use, although she has been abstinent from cocaine.  Will continue motivational interview.    Plan Continue sertraline 100 mg daily (worsening in fatigue at higher dose) Continue bupropion 300 mg daily  Continue clonazepam 0.5 mg daily as needed, 15 tabs per month Next appointment-  5/9 at 1 PM for 20 mins, video  [email or web address] (unable to do in person visit due to lack of transportation) - gabapentin 300 mg at night (limited to take at night only as it causes drowsiness) - Discussed attendance policy - she is on clonidine, prescribed by PCP   Past trials of medication: bupropion, Buspar, hydroxyzine, quetiapine/drowsiness, diazepam   The patient demonstrates the following risk factors for suicide: Chronic risk factors for suicide include: psychiatric disorder of depression, anxiety and substance use disorder. Acute risk factors for suicide include: family or marital conflict and unemployment. Protective factors for this patient include: responsibility to others (children, family), coping skills and hope for the future. Considering these factors, the overall suicide risk at this point appears to be low. Patient is appropriate for outpatient follow up.    [name], MD 03/24/2021, 1:37 PM

## 2021-03-23 ENCOUNTER — Other Ambulatory Visit: Payer: Self-pay | Admitting: Psychiatry

## 2021-03-24 ENCOUNTER — Other Ambulatory Visit: Payer: Self-pay

## 2021-03-24 ENCOUNTER — Telehealth (INDEPENDENT_AMBULATORY_CARE_PROVIDER_SITE_OTHER): Payer: Medicaid Other | Admitting: Psychiatry

## 2021-03-24 ENCOUNTER — Encounter: Payer: Self-pay | Admitting: Psychiatry

## 2021-03-24 DIAGNOSIS — F411 Generalized anxiety disorder: Secondary | ICD-10-CM

## 2021-03-24 DIAGNOSIS — F3341 Major depressive disorder, recurrent, in partial remission: Secondary | ICD-10-CM

## 2021-03-24 MED ORDER — CLONAZEPAM 0.5 MG PO TABS: 0.5000 mg | ORAL_TABLET | Freq: Every day | ORAL | 2 refills | Status: DC | PRN

## 2021-03-24 MED ORDER — BUPROPION HCL ER (XL) 300 MG PO TB24: 300.0000 mg | ORAL_TABLET | Freq: Every day | ORAL | 2 refills | Status: DC

## 2021-03-24 MED ORDER — SERTRALINE HCL 100 MG PO TABS: 100.0000 mg | ORAL_TABLET | Freq: Every day | ORAL | 5 refills | Status: DC

## 2021-06-02 ENCOUNTER — Other Ambulatory Visit: Payer: Self-pay | Admitting: Psychiatry

## 2021-06-13 ENCOUNTER — Other Ambulatory Visit (HOSPITAL_COMMUNITY)
Admission: RE | Admit: 2021-06-13 | Discharge: 2021-06-13 | Disposition: A | Discharge status: Home / Self Care | Payer: Medicaid Other | Source: Ambulatory Visit | Attending: Obstetrics & Gynecology | Admitting: Obstetrics & Gynecology

## 2021-06-13 ENCOUNTER — Ambulatory Visit (INDEPENDENT_AMBULATORY_CARE_PROVIDER_SITE_OTHER): Payer: Medicaid Other | Admitting: Obstetrics & Gynecology

## 2021-06-13 ENCOUNTER — Encounter: Payer: Self-pay | Admitting: Obstetrics & Gynecology

## 2021-06-13 VITALS — BP 143/90 | HR 76 | Ht 61.0 in | Wt 186.0 lb

## 2021-06-13 DIAGNOSIS — Z975 Presence of (intrauterine) contraceptive device: Secondary | ICD-10-CM

## 2021-06-13 DIAGNOSIS — Z113 Encounter for screening for infections with a predominantly sexual mode of transmission: Secondary | ICD-10-CM

## 2021-06-13 DIAGNOSIS — Z01419 Encounter for gynecological examination (general) (routine) without abnormal findings: Secondary | ICD-10-CM | POA: Insufficient documentation

## 2021-06-13 DIAGNOSIS — Z0000 Encounter for general adult medical examination without abnormal findings: Secondary | ICD-10-CM | POA: Diagnosis not present

## 2021-06-13 NOTE — Progress Notes (Signed)
Subjective:  ?  ? Angelica Lopez is a 41 y.o. female here for a routine exam.  No LMP recorded. (Menstrual status: IUD). No obstetric history on file. ?Birth Control Method:  IUD ?Menstrual Calendar(currently): amenorrhea  ?Current complaints: none.  ? ?Current acute medical issues:  none ?  ?Recent Gynecologic History ?No LMP recorded. (Menstrual status: IUD). ?Last Pap: 2021,  normal ?Last mammogram: ,  after 11/28 ? ?Past Medical History:  ?Diagnosis Date  ? Hypertension   ? Kyphosis   ? Scheuermann's disease   ? ? ?Past Surgical History:  ?Procedure Laterality Date  ? CESAREAN SECTION    ? TONSILLECTOMY    ? ? ?OB History   ?No obstetric history on file. ?  ? ? ?Social History  ? ?Socioeconomic History  ? Marital status: Legally Separated  ?  Spouse name: Not on file  ? Number of children: 3  ? Years of education: Not on file  ? Highest education level: Not on file  ?Occupational History  ? Not on file  ?Tobacco Use  ? Smoking status: Every Day  ?  Packs/day: 1.00  ?  Years: 15.00  ?  Pack years: 15.00  ?  Types: Cigarettes  ? Smokeless tobacco: Never  ?Vaping Use  ? Vaping Use: Never used  ?Substance and Sexual Activity  ? Alcohol use: No  ? Drug use: Yes  ?  Types: Marijuana  ?  Comment: twice a week  ? Sexual activity: Not Currently  ?  Birth control/protection: I.U.D.  ?Other Topics Concern  ? Not on file  ?Social History Narrative  ? Not on file  ? ?Social Determinants of Health  ? ?Financial Resource Strain: Not on file  ?Food Insecurity: Not on file  ?Transportation Needs: Not on file  ?Physical Activity: Not on file  ?Stress: Not on file  ?Social Connections: Not on file  ? ? ?Family History  ?Problem Relation Age of Onset  ? Anxiety disorder Mother   ? Depression Mother   ? Hypertension Father   ? Diabetes Father   ? Anxiety disorder Father   ? Depression Father   ? Heart disease Maternal Grandfather   ?     has had 3 open heart surgeries  ? Febrile seizures Daughter   ? Febrile seizures Daughter   ?  Febrile seizures Daughter   ? ? ? ?Current Outpatient Medications:  ?  Buprenorphine HCl-Naloxone HCl 8-2 MG FILM, Place 3 tablets under the tongue daily., Disp: , Rfl:  ?  buPROPion (WELLBUTRIN XL) 300 MG 24 hr tablet, Take 1 tablet (300 mg total) by mouth daily., Disp: 30 tablet, Rfl: 2 ?  cetirizine (ZYRTEC ALLERGY) 10 MG tablet, Take 1 tablet (10 mg total) by mouth daily., Disp: 30 tablet, Rfl: 0 ?  clonazePAM (KLONOPIN) 0.5 MG tablet, Take 1 tablet (0.5 mg total) by mouth daily as needed for anxiety (15 tabs per month)., Disp: 15 tablet, Rfl: 2 ?  ibuprofen (ADVIL) 800 MG tablet, Take 1 tablet (800 mg total) by mouth 3 (three) times daily., Disp: 21 tablet, Rfl: 0 ?  levonorgestrel (MIRENA) 20 MCG/24HR IUD, 1 each by Intrauterine route once., Disp: , Rfl:  ?  lisinopril (ZESTRIL) 20 MG tablet, Take 20 mg by mouth daily., Disp: , Rfl:  ?  sertraline (ZOLOFT) 100 MG tablet, Take 1 tablet (100 mg total) by mouth daily., Disp: 30 tablet, Rfl: 5 ?  fluticasone (FLONASE) 50 MCG/ACT nasal spray, Place 1 spray into both nostrils daily  for 14 days., Disp: 16 g, Rfl: 0 ? ?Review of Systems ? ?Review of Systems  ?Constitutional: Negative for fever, chills, weight loss, malaise/fatigue and diaphoresis.  ?HENT: Negative for hearing loss, ear pain, nosebleeds, congestion, sore throat, neck pain, tinnitus and ear discharge.   ?Eyes: Negative for blurred vision, double vision, photophobia, pain, discharge and redness.  ?Respiratory: Negative for cough, hemoptysis, sputum production, shortness of breath, wheezing and stridor.   ?Cardiovascular: Negative for chest pain, palpitations, orthopnea, claudication, leg swelling and PND.  ?Gastrointestinal: negative for abdominal pain. Negative for heartburn, nausea, vomiting, diarrhea, constipation, blood in stool and melena.  ?Genitourinary: Negative for dysuria, urgency, frequency, hematuria and flank pain.  ?Musculoskeletal: Negative for myalgias, back pain, joint pain and falls.   ?Skin: Negative for itching and rash.  ?Neurological: Negative for dizziness, tingling, tremors, sensory change, speech change, focal weakness, seizures, loss of consciousness, weakness and headaches.  ?Endo/Heme/Allergies: Negative for environmental allergies and polydipsia. Does not bruise/bleed easily.  ?Psychiatric/Behavioral: Negative for depression, suicidal ideas, hallucinations, memory loss and substance abuse. The patient is not nervous/anxious and does not have insomnia.   ? ?  ?  ?Objective:  ?Blood pressure (!) 143/90, pulse 76, height '5\' 1"'$  (1.549 m), weight 186 lb (84.4 kg).  ? Physical Exam  ?Vitals reviewed. ?Constitutional: She is oriented to person, place, and time. She appears well-developed and well-nourished.  ?HENT:  ?Head: Normocephalic and atraumatic.        ?Right Ear: External ear normal.  ?Left Ear: External ear normal.  ?Nose: Nose normal.  ?Mouth/Throat: Oropharynx is clear and moist.  ?Eyes: Conjunctivae and EOM are normal. Pupils are equal, round, and reactive to light. Right eye exhibits no discharge. Left eye exhibits no discharge. No scleral icterus.  ?Neck: Normal range of motion. Neck supple. No tracheal deviation present. No thyromegaly present.  ?Cardiovascular: Normal rate, regular rhythm, normal heart sounds and intact distal pulses.  Exam reveals no gallop and no friction rub.   ?No murmur heard. ?Respiratory: Effort normal and breath sounds normal. No respiratory distress. She has no wheezes. She has no rales. She exhibits no tenderness.  ?GI: Soft. Bowel sounds are normal. She exhibits no distension and no mass. There is no tenderness. There is no rebound and no guarding.  ?Genitourinary:  ?Breasts no masses skin changes or nipple changes bilaterally ?     Vulva is normal without lesions ?Vagina is pink moist without discharge ?Cervix normal in appearance and pap is done  IUD strings visible ?Uterus is normal size shape and contour ?Adnexa is negative with normal sized  ovaries  ? ?Musculoskeletal: Normal range of motion. She exhibits no edema and no tenderness.  ?Neurological: She is alert and oriented to person, place, and time. She has normal reflexes. She displays normal reflexes. No cranial nerve deficit. She exhibits normal muscle tone. Coordination normal.  ?Skin: Skin is warm and dry. No rash noted. No erythema. No pallor.  ?Psychiatric: She has a normal mood and affect. Her behavior is normal. Judgment and thought content normal.  ? ?   ? ?Medications Ordered at today's visit: ?No orders of the defined types were placed in this encounter. ? ? ?Other orders placed at today's visit: ?Orders Placed This Encounter  ?Procedures  ? HIV Antibody (routine testing w rflx)  ? RPR  ? Hepatitis B Surface AntiGEN  ? Hepatitis C Antibody  ? ? ? ? ?Assessment:  ? ?  ICD-10-CM   ?1. Well woman exam with routine gynecological exam  Z01.419   ?  ?2. Screen for STD (sexually transmitted disease)  Z11.3 HIV Antibody (routine testing w rflx)  ?  RPR  ?  Hepatitis B Surface AntiGEN  ?  Hepatitis C Antibody  ? Restarted a sexual relationship, wants STI check, off wet prep as well  ?  ?3. IUD (intrauterine device) in place  Z97.5   ? Placed 07/06/2014-->removed + replaced 1 year 2024  ?  ?4. Encounter for gynecological examination with Papanicolaou smear of cervix  Z01.419 Cytology - PAP( [facility])  ?  ? ? ?  ?Plan:  ? ? Contraception: IUD. ?Follow up in: 1 year.   ? ? ?Return in about 1 year (around 06/14/2022), or if symptoms worsen or fail to improve, for IUD removal replacement. ? ?

## 2021-06-14 LAB — HEPATITIS C ANTIBODY: Hep C Virus Ab: NONREACTIVE

## 2021-06-14 LAB — RPR: RPR Ser Ql: NONREACTIVE

## 2021-06-14 LAB — HEPATITIS B SURFACE ANTIGEN: Hepatitis B Surface Ag: NEGATIVE

## 2021-06-14 LAB — HIV ANTIBODY (ROUTINE TESTING W REFLEX): HIV Screen 4th Generation wRfx: NONREACTIVE

## 2021-06-14 NOTE — Progress Notes (Signed)
Virtual Visit via Video Note ? ?I connected with Angelica Lopez on 06/17/21 at  1:00 PM EDT by a video enabled telemedicine application and verified that I am speaking with the correct person using two identifiers. ? ?Location: ?Patient: home ?Provider: office ?Persons participated in the visit- patient, provider  ?  ?I discussed the limitations of evaluation and management by telemedicine and the availability of in person appointments. The patient expressed understanding and agreed to proceed. ?  ?I discussed the assessment and treatment plan with the patient. The patient was provided an opportunity to ask questions and all were answered. The patient agreed with the plan and demonstrated an understanding of the instructions. ?  ?The patient was advised to call back or seek an in-person evaluation if the symptoms worsen or if the condition fails to improve as anticipated. ? ?I provided 15 minutes of non-face-to-face time during this encounter. ? ? ?Angelica Clay, MD ? ? ? ?[place] MD/PA/NP OP Progress Note ? ?06/17/2021 1:23 PM ?Angelica Lopez  ?MRN:  [id number] ? ?Chief Complaint:  ?Chief Complaint  ?Patient presents with  ? Follow-up  ? Depression  ? ?HPI:  ?This is a follow-up appointment for depression.  ?She states that she has been feeling more depressed due to financial strain..  Food stamp has been cut due to a COVID.  She was missing her mother on Mother's Day.  It has been 2 years since she past this past April.  She was lying in the bed on the day of anniversary.  She has applied for disability due to her shaking spells.  She thinks her tremors would cause liability.  She was referred to a neurologist by her PCP.  She reports better relationship with her oldest daughter. She reports good relationship with her youngest daughter, who is "always there for me." she talks about an episode of her seeing a man. She noticed that her check was missing.  She cannot trust anybody due to this episode.  She feels depressed  at times.  She has occasional insomnia.  She has appetite loss.  She denies SI.  She feels anxious sometimes.  She has occasional panic attacks.  She uses marijuana at times for her pain and anxiety.  She feels comfortable to stay on the current medication regimen at this time.  ? ? ? ?Wt Readings from Last 3 Encounters:  ?06/13/21 186 lb (84.4 kg)  ?11/03/19 200 lb (90.7 kg)  ?08/17/19 195 lb (88.5 kg)  ?  ? 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 ?Visit Diagnosis:  ?  ICD-10-CM   ?1. MDD (major depressive disorder), recurrent episode, mild ([place])  F33.0   ?  ?2. GAD (generalized anxiety disorder)  F41.1 clonazePAM (KLONOPIN) 0.5 MG tablet  ?  ? ? ?Past Psychiatric History: Please see initial evaluation for full details. I have reviewed the history. No updates at this time.  ?  ? ?Past Medical History:  ?Past Medical History:  ?Diagnosis Date  ? Hypertension   ? Kyphosis   ? Scheuermann's disease   ?  ?Past Surgical History:  ?Procedure Laterality Date  ? CESAREAN SECTION    ? TONSILLECTOMY    ? ? ?Family Psychiatric History: Please see initial evaluation  for full details. I have reviewed the history. No updates at this time.  ?  ? ?Family History:  ?Family History  ?Problem Relation Age of Onset  ? Anxiety disorder Mother   ? Depression Mother   ? Hypertension Father   ? Diabetes Father   ? Anxiety disorder Father   ? Depression Father   ? Heart disease Maternal Grandfather   ?     has had 3 open heart surgeries  ? Febrile seizures Daughter   ? Febrile seizures Daughter   ? Febrile seizures Daughter   ? ? ?Social History:  ?Social History  ? ?Socioeconomic History  ? Marital status: Legally Separated  ?  Spouse name: Not on file  ? Number of children: 3  ? Years of  education: Not on file  ? Highest education level: Not on file  ?Occupational History  ? Not on file  ?Tobacco Use  ? Smoking status: Every Day  ?  Packs/day: 1.00  ?  Years: 15.00  ?  Pack years: 15.00  ?  Types: Cigarettes  ? Smokeless tobacco: Never  ?Vaping Use  ? Vaping Use: Never used  ?Substance and Sexual Activity  ? Alcohol use: No  ? Drug use: Yes  ?  Types: Marijuana  ?  Comment: twice a week  ? Sexual activity: Not Currently  ?  Birth control/protection: I.U.D.  ?Other Topics Concern  ? Not on file  ?Social History Narrative  ? Not on file  ? ?Social Determinants of Health  ? ?Financial Resource Strain: Not on file  ?Food Insecurity: Not on file  ?Transportation Needs: Not on file  ?Physical Activity: Not on file  ?Stress: Not on file  ?Social Connections: Not on file  ? ? ?Allergies:  ?Allergies  ?Allergen Reactions  ? Codeine Anaphylaxis  ?  Reaction:Tongue swells  ? ? ?Metabolic Disorder Labs: ?No results found for: HGBA1C, MPG ?No results found for: PROLACTIN ?No results found for: CHOL, TRIG, HDL, CHOLHDL, VLDL, LDLCALC ?Lab Results  ?Component Value Date  ? TSH 1.81 12/06/2017  ? TSH 1.422 05/29/2009  ? ? ?Therapeutic Level Labs: ?No results found for: LITHIUM ?No results found for: VALPROATE ?No components found for:  CBMZ ? ?Current Medications: ?Current Outpatient Medications  ?Medication Sig Dispense Refill  ? Buprenorphine HCl-Naloxone HCl 8-2 MG FILM Place 3 tablets under the tongue daily.    ? [START ON 06/25/2021] buPROPion (WELLBUTRIN XL) 300 MG 24 hr tablet Take 1 tablet (300 mg total) by mouth daily. 30 tablet 3  ? cetirizine (ZYRTEC ALLERGY) 10 MG tablet Take 1 tablet (10 mg total) by mouth daily. 30 tablet 0  ? [START ON 07/06/2021] clonazePAM (KLONOPIN) 0.5 MG tablet Take 1 tablet (0.5 mg total) by mouth daily as needed for anxiety (15 tabs per month). 15 tablet 0  ? fluticasone (FLONASE) 50 MCG/ACT nasal spray Place 1 spray into both nostrils daily for 14 days. 16 g 0  ? ibuprofen  (ADVIL) 800 MG tablet Take 1 tablet (800 mg total) by mouth 3 (three) times daily. 21 tablet 0  ? levonorgestrel (MIRENA) 20 MCG/24HR IUD 1 each by Intrauterine route once.    ? lisinopril (ZESTRIL) 20 MG tablet Take 20 mg by mouth daily.    ? sertraline (ZOLOFT) 100 MG tablet Take 1 tablet (100 mg total) by mouth daily. 30 tablet 5  ? ?No current facility-administered medications for this visit.  ? ? ? ?Musculoskeletal: ?Strength & Muscle Tone:  N/A ?Gait & Station:  N/A ?Patient leans: N/A ? ?Psychiatric Specialty Exam: ?Review of Systems  ?Psychiatric/Behavioral:  Positive for decreased concentration, dysphoric mood and sleep disturbance. Negative for agitation, behavioral problems, confusion, hallucinations, self-injury and suicidal ideas. The patient is nervous/anxious. The patient is not hyperactive.   ?All other systems reviewed and are negative.  ?There were no vitals taken for this visit.There is no height or weight on file to calculate BMI.  ?General Appearance: Fairly Groomed  ?Eye Contact:  Good  ?Speech:  Clear and Coherent  ?Volume:  Normal  ?Mood:  Depressed  ?Affect:  Appropriate, Congruent, and Tearful  ?Thought Process:  Coherent  ?Orientation:  Full (Time, Place, and Person)  ?Thought Content: Logical   ?Suicidal Thoughts:  No  ?Homicidal Thoughts:  No  ?Memory:  Immediate;   Good  ?Judgement:  Good  ?Insight:  Good  ?Psychomotor Activity:  Normal  ?Concentration:  Concentration: Good and Attention Span: Good  ?Recall:  Good  ?Fund of Knowledge: Good  ?Language: Good  ?Akathisia:  No  ?Handed:  Right  ?AIMS (if indicated): not done  ?Assets:  Communication Skills ?Desire for Improvement  ?ADL's:  Intact  ?Cognition: WNL  ?Sleep:  Fair  ? ?Screenings: ?GAD-7   ? ?[facility] Office Visit from 07/17/2019 in [facility]  ?Total GAD-7 Score 6  ? ?  ? ?PHQ2-9   ? ?Flowsheet Row Video Visit from 03/24/2021 in [facility] Video Visit from 09/25/2020 in [facility] Video Visit from 06/28/2020 in [facility] Video Visit from 04/01/2020 in [facility] Office Visit from 07/17/2019 in [facility]

## 2021-06-16 LAB — CYTOLOGY - PAP
Chlamydia: NEGATIVE
Comment: NEGATIVE
Comment: NEGATIVE
Comment: NORMAL
Diagnosis: NEGATIVE
High risk HPV: NEGATIVE
Neisseria Gonorrhea: NEGATIVE

## 2021-06-17 ENCOUNTER — Telehealth (INDEPENDENT_AMBULATORY_CARE_PROVIDER_SITE_OTHER): Payer: Medicaid Other | Admitting: Psychiatry

## 2021-06-17 ENCOUNTER — Encounter: Payer: Self-pay | Admitting: Psychiatry

## 2021-06-17 DIAGNOSIS — F411 Generalized anxiety disorder: Secondary | ICD-10-CM | POA: Diagnosis not present

## 2021-06-17 DIAGNOSIS — F330 Major depressive disorder, recurrent, mild: Secondary | ICD-10-CM | POA: Diagnosis not present

## 2021-06-17 MED ORDER — BUPROPION HCL ER (XL) 300 MG PO TB24: 300.0000 mg | ORAL_TABLET | Freq: Every day | ORAL | 3 refills | Status: DC

## 2021-06-17 MED ORDER — CLONAZEPAM 0.5 MG PO TABS: 0.5000 mg | ORAL_TABLET | Freq: Every day | ORAL | 0 refills | Status: DC | PRN

## 2021-06-17 NOTE — Patient Instructions (Signed)
Continue sertraline 100 mg daily  ?Continue bupropion 300 mg daily  ?Continue clonazepam 0.5 mg daily as needed, 15 tabs per month ?Next appointment-  6/20 at 11 AM ?

## 2021-07-26 NOTE — Progress Notes (Deleted)
[facility] MD/PA/NP OP Progress Note  07/26/2021 5:44 PM [place]  MRN:  [id number]  Chief Complaint: No chief complaint on file.  HPI: *** Visit Diagnosis: No diagnosis found.  Past Psychiatric History: Please see initial evaluation for full details. I have reviewed the history. No updates at this time.     Past Medical History:  Past Medical History:  Diagnosis Date   Hypertension    Kyphosis    Scheuermann's disease     Past Surgical History:  Procedure Laterality Date   CESAREAN SECTION     TONSILLECTOMY      Family Psychiatric History: Please see initial evaluation for full details. I have reviewed the history. No updates at this time.     Family History:  Family History  Problem Relation Age of Onset   Anxiety disorder Mother    Depression Mother    Hypertension Father    Diabetes Father    Anxiety disorder Father    Depression Father    Heart disease Maternal Grandfather        has had 3 open heart surgeries   Febrile seizures Daughter    Febrile seizures Daughter    Febrile seizures Daughter     Social History:  Social History   Socioeconomic History   Marital status: Legally Separated    Spouse name: Not on file   Number of children: 3   Years of education: Not on file   Highest education level: Not on file  Occupational History   Not on file  Tobacco Use   Smoking status: Every Day    Packs/day: 1.00    Years: 15.00    Total pack years: 15.00    Types: Cigarettes   Smokeless tobacco: Never  Vaping Use   Vaping Use: Never used  Substance and Sexual Activity   Alcohol use: No   Drug use: Yes    Types: Marijuana    Comment: twice a week   Sexual activity: Not Currently    Birth control/protection: I.U.D.  Other Topics Concern   Not on file  Social History Narrative   Not on file   Social Determinants of Health   Financial Resource Strain: High Risk (07/17/2019)   Overall Financial Resource Strain (CARDIA)    Difficulty of Paying  Living Expenses: Hard  Food Insecurity: Food Insecurity Present (07/17/2019)   Hunger Vital Sign    Worried About Running Out of Food in the Last Year: Sometimes true    Ran Out of Food in the Last Year: Sometimes true  Transportation Needs: No Transportation Needs (07/17/2019)   PRAPARE - Hydrologist (Medical): No    Lack of Transportation (Non-Medical): No  Physical Activity: Inactive (07/17/2019)   Exercise Vital Sign    Days of Exercise per Week: 0 days    Minutes of Exercise per Session: 0 min  Stress: Stress Concern Present (07/17/2019)   [place]    Feeling of Stress : Very much  Social Connections: Moderately Integrated (07/17/2019)   Social Connection and Isolation Panel [NHANES]    Frequency of Communication with Friends and Family: More than three times a week    Frequency of Social Gatherings with Friends and Family: More than three times a week    Attends Religious Services: 1 to 4 times per year    Active Member of [employer] or Organizations: No    Attends Archivist Meetings: 1 to  4 times per year    Marital Status: Divorced    Allergies:  Allergies  Allergen Reactions   Codeine Anaphylaxis    Reaction:Tongue swells    Metabolic Disorder Labs: No results found for: "HGBA1C", "MPG" No results found for: "PROLACTIN" No results found for: "CHOL", "TRIG", "HDL", "CHOLHDL", "VLDL", "LDLCALC" Lab Results  Component Value Date   TSH 1.81 12/06/2017   TSH 1.422 05/29/2009    Therapeutic Level Labs: No results found for: "LITHIUM" No results found for: "VALPROATE" No results found for: "CBMZ"  Current Medications: Current Outpatient Medications  Medication Sig Dispense Refill   Buprenorphine HCl-Naloxone HCl 8-2 MG FILM Place 3 tablets under the tongue daily.     buPROPion (WELLBUTRIN XL) 300 MG 24 hr tablet Take 1 tablet (300 mg total) by mouth daily. 30 tablet 3    cetirizine (ZYRTEC ALLERGY) 10 MG tablet Take 1 tablet (10 mg total) by mouth daily. 30 tablet 0   clonazePAM (KLONOPIN) 0.5 MG tablet Take 1 tablet (0.5 mg total) by mouth daily as needed for anxiety (15 tabs per month). 15 tablet 0   fluticasone (FLONASE) 50 MCG/ACT nasal spray Place 1 spray into both nostrils daily for 14 days. 16 g 0   ibuprofen (ADVIL) 800 MG tablet Take 1 tablet (800 mg total) by mouth 3 (three) times daily. 21 tablet 0   levonorgestrel (MIRENA) 20 MCG/24HR IUD 1 each by Intrauterine route once.     lisinopril (ZESTRIL) 20 MG tablet Take 20 mg by mouth daily.     sertraline (ZOLOFT) 100 MG tablet Take 1 tablet (100 mg total) by mouth daily. 30 tablet 5   No current facility-administered medications for this visit.     Musculoskeletal: Strength & Muscle Tone: within normal limits Gait & Station: normal Patient leans: N/A  Psychiatric Specialty Exam: Review of Systems  There were no vitals taken for this visit.There is no height or weight on file to calculate BMI.  General Appearance: {Appearance:22683}  Eye Contact:  {BHH EYE CONTACT:22684}  Speech:  Clear and Coherent  Volume:  Normal  Mood:  {BHH MOOD:22306}  Affect:  {Affect (PAA):22687}  Thought Process:  Coherent  Orientation:  Full (Time, Place, and Person)  Thought Content: Logical   Suicidal Thoughts:  {ST/HT (PAA):22692}  Homicidal Thoughts:  {ST/HT (PAA):22692}  Memory:  Immediate;   Good  Judgement:  {Judgement (PAA):22694}  Insight:  {Insight (PAA):22695}  Psychomotor Activity:  Normal  Concentration:  Concentration: Good and Attention Span: Good  Recall:  Good  Fund of Knowledge: Good  Language: Good  Akathisia:  No  Handed:  Right  AIMS (if indicated): not done  Assets:  Communication Skills Desire for Improvement  ADL's:  Intact  Cognition: WNL  Sleep:  {BHH GOOD/FAIR/POOR:22877}   Screenings: GAD-7    Flowsheet Row Office Visit from 07/17/2019 in [facility]  Total  GAD-7 Score 6      PHQ2-9    Flowsheet Row Video Visit from 03/24/2021 in [facility] Video Visit from 09/25/2020 in [facility] Video Visit from 06/28/2020 in [facility] Video Visit from 04/01/2020 in [facility] Office Visit from 07/17/2019 in [facility]  PHQ-2 Total Score '2 2 2 2 2  '$ PHQ-9 Total Score '4 6 3 10 8      '$ Flowsheet Row ED from 11/09/2020 in [facility] Visit from 04/01/2020 in [facility]  RISK CATEGORY No Risk No Risk        Assessment and Plan:  Angelica Lopez is a 41 y.o. 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     1. MDD (major depressive disorder), recurrent episode, mild ([place]) 2. GAD (generalized anxiety disorder) She reports mild worsening in depressive symptoms in the context of grief of loss of her mother and anniversary/Mother's Day.  Other psychosocial stressors includes her father, who was incarcerated yesterday, grief of loss of her mother, and occasional conflict with her oldest daughter.  Although she might benefit from adjunctive treatment in the future, will hold this at this time given her reported worsening in triggers.  She was referred to neurology by her PCP for this.  Will continue sertraline to target depression and anxiety.  Will continue bupropion adjunctive treatment for depression.  Discussed potential risk of seizure.  Will continue clonazepam as needed for anxiety. Noted that she was reportedly started on BuSpar with good benefit, although this information was not available in the chart.  Will continue to monitor.    3. Opioid dependence in remission [facility]) She is  on Suboxone, prescribed by PCP.  Will continue motivational interview.    4. Cocaine abuse ([place]) # Marijuana use Unchanged. She is at pre contemplative stage for marijuana use, although she has been abstinent from cocaine.  Will continue motivational interview.    Plan Continue sertraline 100 mg daily (worsening in fatigue at higher dose) Continue bupropion 300 mg daily (had seizure when she was a child) Continue clonazepam 0.5 mg daily as needed, 15 tabs per month Next appointment-  6/20 at 11 AM for 30 mins, in person  [email or web address] (unable to do in person visit due to lack of transportation) - gabapentin 300 mg at night (limited to take at night only as it causes drowsiness) - Discussed attendance policy - she is on clonidine, prescribed by PCP   Past trials of medication: bupropion, Buspar, hydroxyzine, quetiapine/drowsiness, diazepam   The patient demonstrates the following risk factors for suicide: Chronic risk factors for suicide include: psychiatric disorder of depression, anxiety and substance use disorder. Acute risk factors for suicide include: family or marital conflict and unemployment. Protective factors for this patient include: responsibility to others (children, family), coping skills and hope for the future. Considering these factors, the overall suicide risk at this point appears to be low. Patient is appropriate for outpatient follow up.             Collaboration of Care: Collaboration of Care: {BH OP Collaboration of Care:21014065}  Patient/Guardian was advised Release of Information must be obtained prior to any record release in order to collaborate their care with an outside provider. Patient/Guardian was advised if they have not already done so to contact the registration department to sign all necessary forms in order for [place] to release information regarding their care.   Consent: Patient/Guardian gives verbal consent for treatment and assignment of  benefits for services provided during this visit. Patient/Guardian expressed understanding and agreed to proceed.    [name], MD 07/26/2021, 5:44 PM

## 2021-07-29 ENCOUNTER — Ambulatory Visit: Payer: Medicaid Other | Admitting: Psychiatry

## 2021-07-30 ENCOUNTER — Encounter (HOSPITAL_COMMUNITY): Payer: Self-pay | Admitting: Emergency Medicine

## 2021-07-30 ENCOUNTER — Emergency Department (HOSPITAL_COMMUNITY)
Admission: EM | Admit: 2021-07-30 | Discharge: 2021-07-30 | Disposition: A | Discharge status: Home / Self Care | Payer: Medicaid Other | Attending: Emergency Medicine | Admitting: Emergency Medicine

## 2021-07-30 ENCOUNTER — Other Ambulatory Visit: Payer: Self-pay

## 2021-07-30 DIAGNOSIS — Z79899 Other long term (current) drug therapy: Secondary | ICD-10-CM | POA: Diagnosis not present

## 2021-07-30 DIAGNOSIS — R519 Headache, unspecified: Secondary | ICD-10-CM | POA: Insufficient documentation

## 2021-07-30 DIAGNOSIS — R109 Unspecified abdominal pain: Secondary | ICD-10-CM | POA: Diagnosis not present

## 2021-07-30 DIAGNOSIS — I10 Essential (primary) hypertension: Secondary | ICD-10-CM | POA: Diagnosis not present

## 2021-07-30 DIAGNOSIS — R112 Nausea with vomiting, unspecified: Secondary | ICD-10-CM | POA: Insufficient documentation

## 2021-07-30 DIAGNOSIS — R197 Diarrhea, unspecified: Secondary | ICD-10-CM | POA: Insufficient documentation

## 2021-07-30 HISTORY — DX: Anxiety disorder, unspecified: F41.9

## 2021-07-30 HISTORY — DX: Dorsalgia, unspecified: M54.9

## 2021-07-30 HISTORY — DX: Depression, unspecified: F32.A

## 2021-07-30 LAB — CBC WITH DIFFERENTIAL/PLATELET
Abs Immature Granulocytes: 0.01 10*3/uL (ref 0.00–0.07)
Basophils Absolute: 0 10*3/uL (ref 0.0–0.1)
Basophils Relative: 1 %
Eosinophils Absolute: 0 10*3/uL (ref 0.0–0.5)
Eosinophils Relative: 1 %
HCT: 46 % (ref 36.0–46.0)
Hemoglobin: 15.4 g/dL — ABNORMAL HIGH (ref 12.0–15.0)
Immature Granulocytes: 0 %
Lymphocytes Relative: 16 %
Lymphs Abs: 0.9 10*3/uL (ref 0.7–4.0)
MCH: 27.4 pg (ref 26.0–34.0)
MCHC: 33.5 g/dL (ref 30.0–36.0)
MCV: 81.7 fL (ref 80.0–100.0)
Monocytes Absolute: 0.1 10*3/uL (ref 0.1–1.0)
Monocytes Relative: 2 %
Neutro Abs: 4.6 10*3/uL (ref 1.7–7.7)
Neutrophils Relative %: 80 %
Platelets: 222 10*3/uL (ref 150–400)
RBC: 5.63 MIL/uL — ABNORMAL HIGH (ref 3.87–5.11)
RDW: 13.2 % (ref 11.5–15.5)
WBC: 5.7 10*3/uL (ref 4.0–10.5)
nRBC: 0 % (ref 0.0–0.2)

## 2021-07-30 LAB — COMPREHENSIVE METABOLIC PANEL
ALT: 20 U/L (ref 0–44)
AST: 22 U/L (ref 15–41)
Albumin: 4.2 g/dL (ref 3.5–5.0)
Alkaline Phosphatase: 104 U/L (ref 38–126)
Anion gap: 10 (ref 5–15)
BUN: 12 mg/dL (ref 6–20)
CO2: 21 mmol/L — ABNORMAL LOW (ref 22–32)
Calcium: 9.6 mg/dL (ref 8.9–10.3)
Chloride: 102 mmol/L (ref 98–111)
Creatinine, Ser: 0.64 mg/dL (ref 0.44–1.00)
GFR, Estimated: >60 mL/min (ref >60)
Glucose, Bld: 119 mg/dL — ABNORMAL HIGH (ref 70–99)
Potassium: 3.8 mmol/L (ref 3.5–5.1)
Sodium: 133 mmol/L — ABNORMAL LOW (ref 135–145)
Total Bilirubin: 0.8 mg/dL (ref 0.3–1.2)
Total Protein: 8.3 g/dL — ABNORMAL HIGH (ref 6.5–8.1)

## 2021-07-30 LAB — LIPASE, BLOOD: Lipase: 23 U/L (ref 11–51)

## 2021-07-30 LAB — MAGNESIUM: Magnesium: 2.1 mg/dL (ref 1.7–2.4)

## 2021-07-30 MED ORDER — LACTATED RINGERS IV BOLUS
2000.0000 mL | Freq: Once | INTRAVENOUS | Status: AC
  Administered 2021-07-30: 2000 mL via INTRAVENOUS

## 2021-07-30 MED ORDER — BUPRENORPHINE HCL-NALOXONE HCL 8-2 MG SL SUBL
1.0000 | SUBLINGUAL_TABLET | Freq: Once | SUBLINGUAL | Status: AC
  Administered 2021-07-30: 1 via SUBLINGUAL
  Filled 2021-07-30: qty 1

## 2021-07-30 MED ORDER — SERTRALINE HCL 50 MG PO TABS
100.0000 mg | ORAL_TABLET | Freq: Every day | ORAL | Status: DC
  Administered 2021-07-30: 100 mg via ORAL
  Filled 2021-07-30: qty 2

## 2021-07-30 MED ORDER — LISINOPRIL 10 MG PO TABS
20.0000 mg | ORAL_TABLET | Freq: Every day | ORAL | Status: DC
  Administered 2021-07-30: 20 mg via ORAL
  Filled 2021-07-30: qty 2

## 2021-07-30 MED ORDER — ONDANSETRON HCL 4 MG/2ML IJ SOLN
4.0000 mg | Freq: Once | INTRAMUSCULAR | Status: AC
Start: 2021-07-30 — End: 2021-07-30
  Administered 2021-07-30: 4 mg via INTRAVENOUS
  Filled 2021-07-30: qty 2

## 2021-07-30 MED ORDER — ONDANSETRON 4 MG PO TBDP: 4.0000 mg | ORAL_TABLET | Freq: Three times a day (TID) | ORAL | 0 refills | Status: DC | PRN

## 2021-07-30 NOTE — Discharge Instructions (Addendum)
A prescription for Zofran was sent to your pharmacy.  This is a medication to treat nausea.  Take this as needed.  Drink plenty of fluids to stay hydrated at home.  Return to the emergency department at any time for any worsening of symptoms.

## 2021-07-30 NOTE — ED Triage Notes (Signed)
Vomiting x 2 days diarrhea started today. Generalized Abd pain started after throwing up numerous times. PCP advised to come to ER.

## 2021-07-30 NOTE — ED Provider Notes (Signed)
[facility] EMERGENCY DEPARTMENT Provider Note   CSN: [id number] Arrival date & time: 07/30/21  1538     History  Chief Complaint  Patient presents with   Emesis    Angelica Lopez is a 41 y.o. female.   Emesis Severity:  Severe Duration:  2 days Timing:  Constant Number of daily episodes:  10 Quality:  Stomach contents Progression:  Worsening Chronicity:  New Associated symptoms: abdominal pain, chills, diarrhea and headaches    Patient presents for emesis.  Medical history includes HTN, STIs, depression, opioid use disorder.  She is prescribed Suboxone, which she takes 3 times daily.  She is also prescribed Klonopin which she takes as needed.  Patient reports that nausea and vomiting began yesterday.  Since that time, she has been unable to tolerate any p.o. intake, including fluids.  She has not taken any antiemetics at home.  She was able to take her medications yesterday.  She was not able to take her medications today.  When she tries to eat or drink, she has recurrence of vomiting.  She had to have 2 episodes of loose stools.  She denies any bloody contents to vomitus or stool.  Following the onset of her vomiting, patient has developed epigastric pain.  Currently, she also endorses a generalized headache.  She denies any history of abdominal surgery.    Home Medications Prior to Admission medications   Medication Sig Start Date End Date Taking? Authorizing Provider  ondansetron (ZOFRAN-ODT) 4 MG disintegrating tablet Take 1 tablet (4 mg total) by mouth every 8 (eight) hours as needed for nausea or vomiting. 07/30/21  Yes Godfrey Pick, MD  Buprenorphine HCl-Naloxone HCl 8-2 MG FILM Place 3 tablets under the tongue daily.    [provider]  buPROPion (WELLBUTRIN XL) 300 MG 24 hr tablet Take 1 tablet (300 mg total) by mouth daily. 06/25/21 10/23/21  [name], MD  cetirizine (ZYRTEC ALLERGY) 10 MG tablet Take 1 tablet (10 mg total) by mouth daily. 11/03/19   Avegno,  Darrelyn Hillock, FNP  clonazePAM (KLONOPIN) 0.5 MG tablet Take 1 tablet (0.5 mg total) by mouth daily as needed for anxiety (15 tabs per month). 07/06/21 08/05/21  [name], MD  fluticasone (FLONASE) 50 MCG/ACT nasal spray Place 1 spray into both nostrils daily for 14 days. 11/03/19 11/17/19  Avegno, Darrelyn Hillock, FNP  ibuprofen (ADVIL) 800 MG tablet Take 1 tablet (800 mg total) by mouth 3 (three) times daily. 08/17/19   Avegno, Darrelyn Hillock, FNP  levonorgestrel (MIRENA) 20 MCG/24HR IUD 1 each by Intrauterine route once.    [provider]  lisinopril (ZESTRIL) 20 MG tablet Take 20 mg by mouth daily. 07/17/19   [provider]  sertraline (ZOLOFT) 100 MG tablet Take 1 tablet (100 mg total) by mouth daily. 03/27/21 09/23/21  [name], MD      Allergies    Codeine    Review of Systems   Review of Systems  Constitutional:  Positive for chills.  Gastrointestinal:  Positive for abdominal pain, diarrhea, nausea and vomiting.  Neurological:  Positive for headaches.  All other systems reviewed and are negative.   Physical Exam Updated Vital Signs BP (!) 161/84   Pulse (!) 55   Temp 98.4 F (36.9 C) (Oral)   Resp 14   Ht '5\' 2"'$  (1.575 m)   Wt 81.6 kg   SpO2 97%   BMI 32.92 kg/m  Physical Exam Vitals and nursing note reviewed.  Constitutional:  General: She is not in acute distress.    Appearance: She is well-developed. She is ill-appearing. She is not toxic-appearing or diaphoretic.  HENT:     Head: Normocephalic and atraumatic.     Right Ear: External ear normal.     Left Ear: External ear normal.     Nose: Nose normal.     Mouth/Throat:     Mouth: Mucous membranes are moist.     Pharynx: Oropharynx is clear.  Eyes:     General: No scleral icterus.    Extraocular Movements: Extraocular movements intact.     Conjunctiva/sclera: Conjunctivae normal.  Cardiovascular:     Rate and Rhythm: Normal rate and regular rhythm.     Heart sounds: No murmur heard. Pulmonary:      Effort: Pulmonary effort is normal. No respiratory distress.     Breath sounds: Normal breath sounds. No wheezing or rales.  Chest:     Chest wall: No tenderness.  Abdominal:     Palpations: Abdomen is soft.     Tenderness: There is no abdominal tenderness.  Musculoskeletal:        General: No swelling. Normal range of motion.     Cervical back: Normal range of motion and neck supple.     Right lower leg: No edema.     Left lower leg: No edema.  Skin:    General: Skin is warm and dry.     Capillary Refill: Capillary refill takes less than 2 seconds.     Coloration: Skin is not jaundiced or pale.  Neurological:     General: No focal deficit present.     Mental Status: She is alert and oriented to person, place, and time.     Cranial Nerves: No cranial nerve deficit.     Sensory: No sensory deficit.     Motor: No weakness.     Coordination: Coordination normal.  Psychiatric:        Mood and Affect: Mood normal.        Behavior: Behavior normal.        Thought Content: Thought content normal.        Judgment: Judgment normal.     ED Results / Procedures / Treatments   Labs (all labs ordered are listed, but only abnormal results are displayed) Labs Reviewed  COMPREHENSIVE METABOLIC PANEL - Abnormal; Notable for the following components:      Result Value   Sodium 133 (*)    CO2 21 (*)    Glucose, Bld 119 (*)    Total Protein 8.3 (*)    All other components within normal limits  CBC WITH DIFFERENTIAL/PLATELET - Abnormal; Notable for the following components:   RBC 5.63 (*)    Hemoglobin 15.4 (*)    All other components within normal limits  LIPASE, BLOOD  MAGNESIUM  URINALYSIS, ROUTINE W REFLEX MICROSCOPIC  POC URINE PREG, ED    EKG None  Radiology No results found.  Procedures Procedures    Medications Ordered in ED Medications  lisinopril (ZESTRIL) tablet 20 mg (20 mg Oral Given 07/30/21 1818)  sertraline (ZOLOFT) tablet 100 mg (100 mg Oral Given  07/30/21 1818)  ondansetron (ZOFRAN) injection 4 mg (4 mg Intravenous Given 07/30/21 1630)  lactated ringers bolus 2,000 mL (0 mLs Intravenous Stopped 07/30/21 1819)  buprenorphine-naloxone (SUBOXONE) 8-2 mg per SL tablet 1 tablet (1 tablet Sublingual Given 07/30/21 1818)    ED Course/ Medical Decision Making/ A&P  Medical Decision Making Amount and/or Complexity of Data Reviewed Labs: ordered.  Risk Prescription drug management.   This patient presents to the ED for concern of emesis, this involves an extensive number of treatment options, and is a complaint that carries with it a high risk of complications and morbidity.  The differential diagnosis includes opiate withdrawal, gastroenteritis, GERD, dehydration, pancreatitis, cholecystitis, PUD   Co morbidities that complicate the patient evaluation  HTN, STIs, depression, opioid use disorder   Additional history obtained:  Additional history obtained from N/A External records from outside source obtained and reviewed including EMR   Lab Tests:  I Ordered, and personally interpreted labs.  The pertinent results include: Increased hemoglobin from baseline consistent with dehydration; normal lipase, normal hepatobiliary enzymes, no leukocytosis  Cardiac Monitoring: / EKG:  The patient was maintained on a cardiac monitor.  I personally viewed and interpreted the cardiac monitored which showed an underlying rhythm of: Sinus rhythm  Problem List / ED Course / Critical interventions / Medication management  Patient is a 41 year old female presenting for persistent emesis since yesterday.  Symptoms became severe overnight and this morning.  She reports p.o. intolerance.  For this reason, she presents to the ED.  On arrival, patient is well-appearing.  Vital signs are notable for hypertension which I suspect is from missed home blood pressure medication as well as epigastric pain.  She does have some mild  tenderness in her epigastrium.  She states that her vomiting preceded her epigastric pain.  Patient is on daily Suboxone and has not been able to take her medication today.  I suspect that there is a opiate withdrawal component to her symptoms.  2 L of IV fluid and 4 mg of Zofran were ordered.  Laboratory work-up was initiated.  Lab results showed no elevation in hepatobiliary enzymes, lipase, or WBC.  These results are reassuring.  On reassessment, patient reports resolution of symptoms.  She was given ginger ale and saltine crackers.  Additionally, her home medications were ordered, including her Suboxone.  On further reassessment, patient had continued improved symptoms.  Prescription was sent for as needed Zofran at home.  She was discharged in good condition. I ordered medication including IV fluids for dehydration; Zofran for nausea; home dose of Suboxone for ongoing management of opioid dependence; lisinopril for hypertension Reevaluation of the patient after these medicines showed that the patient resolved I have reviewed the patients home medicines and have made adjustments as needed   Social Determinants of Health:  Has access to outpatient care          Final Clinical Impression(s) / ED Diagnoses Final diagnoses:  Nausea and vomiting, unspecified vomiting type    Rx / DC Orders ED Discharge Orders          Ordered    ondansetron (ZOFRAN-ODT) 4 MG disintegrating tablet  Every 8 hours PRN        07/30/21 1852              Godfrey Pick, MD 07/30/21 [phone]

## 2021-08-05 ENCOUNTER — Other Ambulatory Visit: Payer: Self-pay | Admitting: Psychiatry

## 2021-08-05 DIAGNOSIS — F411 Generalized anxiety disorder: Secondary | ICD-10-CM
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
## 2021-08-14 ENCOUNTER — Ambulatory Visit (INDEPENDENT_AMBULATORY_CARE_PROVIDER_SITE_OTHER): Payer: Medicaid Other | Admitting: Psychiatry

## 2021-08-14 ENCOUNTER — Encounter: Payer: Self-pay | Admitting: Psychiatry

## 2021-08-14 VITALS — BP 121/81 | HR 76 | Ht 61.0 in | Wt 179.0 lb

## 2021-08-14 DIAGNOSIS — F411 Generalized anxiety disorder: Secondary | ICD-10-CM | POA: Diagnosis not present

## 2021-08-14 DIAGNOSIS — F331 Major depressive disorder, recurrent, moderate: Secondary | ICD-10-CM | POA: Diagnosis not present

## 2021-08-14 DIAGNOSIS — G4700 Insomnia, unspecified: Secondary | ICD-10-CM

## 2021-08-14 MED ORDER — CLONAZEPAM 0.5 MG PO TABS: 0.5000 mg | ORAL_TABLET | Freq: Every day | ORAL | 1 refills | Status: DC | PRN

## 2021-08-14 MED ORDER — SERTRALINE HCL 100 MG PO TABS: 100.0000 mg | ORAL_TABLET | Freq: Every day | ORAL | 5 refills | Status: DC

## 2021-08-14 NOTE — Patient Instructions (Signed)
Continue sertraline 100 mg daily  Continue bupropion 300 mg daily Continue clonazepam 0.5 mg daily as needed, 15 tabs per month Next appointment-  9/11 at 10 AM, in person

## 2021-09-23 ENCOUNTER — Other Ambulatory Visit: Payer: Self-pay | Admitting: Psychiatry

## 2021-09-29 ENCOUNTER — Institutional Professional Consult (permissible substitution): Payer: Medicaid Other | Admitting: Neurology

## 2021-10-17 NOTE — Progress Notes (Unsigned)
Virtual Visit via Video Note  I connected with Angelica Lopez on 10/20/21 at 10:00 AM EDT by a video enabled telemedicine application and verified that I am speaking with the correct person using two identifiers.  Location: Patient: home Provider: office Persons participated in the visit- patient, provider    I discussed the limitations of evaluation and management by telemedicine and the availability of in person appointments. The patient expressed understanding and agreed to proceed.  I discussed the assessment and treatment plan with the patient. The patient was provided an opportunity to ask questions and all were answered. The patient agreed with the plan and demonstrated an understanding of the instructions.   The patient was advised to call back or seek an in-person evaluation if the symptoms worsen or if the condition fails to improve as anticipated.  I provided 16 minutes of non-face-to-face time during this encounter.   Angelica Clay, MD     [facility] MD/PA/NP OP Progress Note  10/20/2021 10:29 AM Angelica Lopez JETTIE MANNOR  MRN:  [id number]  Chief Complaint:  Chief Complaint  Patient presents with   Follow-up   HPI:  This is a follow-up appointment for depression and anxiety.  She states that she will be seen by a psychologist for disability evaluation.  She is concerned about financial strain since school has started.  She reports good relationship with her youngest daughter.  She continues to have estranged relationship with her oldest daughter.  Her father is in jail.  He has 13 charges, including assault.  She is concerned about him as he is not on medication despite his diagnosis of schizophrenia and bipolar disorder.  She received a letter from him, asking for money.  Although she wants to help him, there is nothing she can do.  She continues to have some shakiness in her hands and legs.  She tries to take clonazepam every other day for this.  Although she initially asks to increase  the frequency of clonazepam, she agrees to stay on the current medication regimen.  Although she feels down and depressed at times, she thinks she has been doing better since being back on routine after her daughter returned to school.  She sleeps better.  Although she reports slight decrease in appetite, she denies change in weight.  She denies SI.  She denies alcohol use or drug use.     Wt Readings from Last 3 Encounters:  08/14/21 179 lb (81.2 kg)  07/30/21 180 lb (81.6 kg)  06/13/21 186 lb (84.4 kg)     Daily routine: takes care of her children Employment: unemployed, using child support Support: neighbor Household:  40 yo old daughter in 2022  Marital status:  Divorced in 2016, used to be married for 15 years Number of children: 40, - 52 yo, 70 yo, middle one is at [employer], oldest daughter works at [employer]  Visit Diagnosis:    ICD-10-CM   1. MDD (major depressive disorder), recurrent episode, moderate (HCC)  F33.1     2. GAD (generalized anxiety disorder)  F41.1 clonazePAM (KLONOPIN) 0.5 MG tablet    3. Insomnia, unspecified type  G47.00       Past Psychiatric History: Please see initial evaluation for full details. I have reviewed the history. No updates at this time.     Past Medical History:  Past Medical History:  Diagnosis Date   Anxiety    Back pain    Depression    Hypertension    Kyphosis  Scheuermann's disease     Past Surgical History:  Procedure Laterality Date   CESAREAN SECTION     TONSILLECTOMY      Family Psychiatric History: Please see initial evaluation for full details. I have reviewed the history. No updates at this time.     Family History:  Family History  Problem Relation Age of Onset   Anxiety disorder Mother    Depression Mother    Hypertension Father    Diabetes Father    Anxiety disorder Father    Depression Father    Heart disease Maternal Grandfather        has had 3 open heart surgeries   Febrile seizures Daughter     Febrile seizures Daughter    Febrile seizures Daughter     Social History:  Social History   Socioeconomic History   Marital status: Legally Separated    Spouse name: Not on file   Number of children: 3   Years of education: Not on file   Highest education level: Not on file  Occupational History   Not on file  Tobacco Use   Smoking status: Every Day    Packs/day: 2.00    Years: 15.00    Total pack years: 30.00    Types: Cigarettes   Smokeless tobacco: Never  Vaping Use   Vaping Use: Never used  Substance and Sexual Activity   Alcohol use: No   Drug use: Yes    Types: Marijuana    Comment: twice a week   Sexual activity: Not Currently    Birth control/protection: I.U.D.  Other Topics Concern   Not on file  Social History Narrative   Not on file   Social Determinants of Health   Financial Resource Strain: High Risk (07/17/2019)   Overall Financial Resource Strain (CARDIA)    Difficulty of Paying Living Expenses: Hard  Food Insecurity: Food Insecurity Present (07/17/2019)   Hunger Vital Sign    Worried About Running Out of Food in the Last Year: Sometimes true    Ran Out of Food in the Last Year: Sometimes true  Transportation Needs: No Transportation Needs (07/17/2019)   PRAPARE - Hydrologist (Medical): No    Lack of Transportation (Non-Medical): No  Physical Activity: Inactive (07/17/2019)   Exercise Vital Sign    Days of Exercise per Week: 0 days    Minutes of Exercise per Session: 0 min  Stress: Stress Concern Present (07/17/2019)   [place]    Feeling of Stress : Very much  Social Connections: Moderately Integrated (07/17/2019)   Social Connection and Isolation Panel [NHANES]    Frequency of Communication with Friends and Family: More than three times a week    Frequency of Social Gatherings with Friends and Family: More than three times a week    Attends Religious  Services: 1 to 4 times per year    Active Member of [employer] or Organizations: No    Attends Archivist Meetings: 1 to 4 times per year    Marital Status: Divorced    Allergies:  Allergies  Allergen Reactions   Codeine Anaphylaxis    Reaction:Tongue swells    Metabolic Disorder Labs: No results found for: "HGBA1C", "MPG" No results found for: "PROLACTIN" No results found for: "CHOL", "TRIG", "HDL", "CHOLHDL", "VLDL", "LDLCALC" Lab Results  Component Value Date   TSH 1.81 12/06/2017   TSH 1.422 05/29/2009    Therapeutic Level  Labs: No results found for: "LITHIUM" No results found for: "VALPROATE" No results found for: "CBMZ"  Current Medications: Current Outpatient Medications  Medication Sig Dispense Refill   Buprenorphine HCl-Naloxone HCl 8-2 MG FILM Place 3 tablets under the tongue daily.     [START ON 10/24/2021] buPROPion (WELLBUTRIN XL) 300 MG 24 hr tablet Take 1 tablet (300 mg total) by mouth daily. 30 tablet 3   cetirizine (ZYRTEC ALLERGY) 10 MG tablet Take 1 tablet (10 mg total) by mouth daily. 30 tablet 0   [START ON 11/05/2021] clonazePAM (KLONOPIN) 0.5 MG tablet Take 1 tablet (0.5 mg total) by mouth daily as needed for anxiety. 15 tabs per month 15 tablet 1   fluticasone (FLONASE) 50 MCG/ACT nasal spray Place 1 spray into both nostrils daily for 14 days. 16 g 0   ibuprofen (ADVIL) 800 MG tablet Take 1 tablet (800 mg total) by mouth 3 (three) times daily. 21 tablet 0   levonorgestrel (MIRENA) 20 MCG/24HR IUD 1 each by Intrauterine route once.     lisinopril (ZESTRIL) 40 MG tablet Take 40 mg by mouth daily.     ondansetron (ZOFRAN-ODT) 4 MG disintegrating tablet Take 1 tablet (4 mg total) by mouth every 8 (eight) hours as needed for nausea or vomiting. 20 tablet 0   sertraline (ZOLOFT) 100 MG tablet Take 1 tablet (100 mg total) by mouth daily. 30 tablet 5   No current facility-administered medications for this visit.     Musculoskeletal: Strength & Muscle  Tone: within normal limits Gait & Station: normal Patient leans: N/A  Psychiatric Specialty Exam: Review of Systems  Psychiatric/Behavioral:  Positive for dysphoric mood. Negative for agitation, behavioral problems, confusion, decreased concentration, hallucinations, self-injury, sleep disturbance and suicidal ideas. The patient is nervous/anxious. The patient is not hyperactive.   All other systems reviewed and are negative.   There were no vitals taken for this visit.There is no height or weight on file to calculate BMI.  General Appearance: Fairly Groomed  Eye Contact:  Good  Speech:  Clear and Coherent  Volume:  Normal  Mood:  Depressed  Affect:  Appropriate, Congruent, and calm  Thought Process:  Coherent  Orientation:  Full (Time, Place, and Person)  Thought Content: Logical   Suicidal Thoughts:  No  Homicidal Thoughts:  No  Memory:  Immediate;   Good  Judgement:  Good  Insight:  Good  Psychomotor Activity:  Normal  Concentration:  Concentration: Good and Attention Span: Good  Recall:  Good  Fund of Knowledge: Good  Language: Good  Akathisia:  No  Handed:  Right  AIMS (if indicated): not done  Assets:  Communication Skills Desire for Improvement  ADL's:  Intact  Cognition: WNL  Sleep:  Fair   Screenings: GAD-7    Flowsheet Row Office Visit from 07/17/2019 in [place]  Total GAD-7 Score 6      PHQ2-9    [place] Office Visit from 08/14/2021 in [place] Video Visit from 03/24/2021 in [place] Video Visit from 09/25/2020 in [place] Video Visit from 06/28/2020 in [place] Video Visit from 04/01/2020 in [place]  PHQ-2 Total Score '2 2 2 2 2  '$ PHQ-9 Total Score '11 4 6 3 10      '$ [place] Office Visit from 08/14/2021 in [place] [facility] from 07/30/2021 in [place] [facility] from 11/09/2020 in [facility] at  [place]  C-SSRS RISK CATEGORY No Risk No Risk No Risk        Assessment and Plan:  Cash Angelica Lopez is a 41 y.o. 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   1. GAD (generalized anxiety disorder) 2. MDD (major depressive disorder), recurrent episode, moderate ([place]) Although she reports depressive symptoms and then anxiety, it has been overall improving since the last visit.   Psychosocial stressors includes financial strain, grief of loss of her mother in 2021, her father, who has been incarcerated.  Will continue current dose of sertraline to target depression and anxiety.  Will continue bupropion as adjunctive treatment for depression.  Will continue clonazepam as needed for anxiety.   3. Insomnia, unspecified type Improving. She reports snoring, daytime fatigue and middle insomnia.  Referral was made for evaluation of sleep apnea.   3. Opioid dependence in remission [facility]) She is on Suboxone, prescribed by PCP.  Will continue motivational interview.    4. Cocaine abuse ([place]) # Marijuana use Improving. She has cut down marijuana use.  She has been abstinent from cocaine use.  Will continue motivational interview.    Plan Continue sertraline 100 mg daily (worsening in fatigue at higher dose) Continue bupropion 300 mg daily (had seizure when she was a child) Continue clonazepam 0.5 mg daily as needed, 15 tabs per month Next appointment-  11/13 at 11:30 for 30 mins, in person  [email or web address]  - gabapentin 300 mg at night (limited to take at night only as it causes drowsiness) - Discussed attendance policy - she is on clonidine, prescribed by PCP   Past trials of medication: bupropion, Buspar, hydroxyzine,  quetiapine/drowsiness, diazepam   The patient demonstrates the following risk factors for suicide: Chronic risk factors for suicide include: psychiatric disorder of depression, anxiety and substance use disorder. Acute risk factors for suicide include: family or marital conflict and unemployment. Protective factors for this patient include: responsibility to others (children, family), coping skills and hope for the future. Considering these factors, the overall suicide risk at this point appears to be low. Patient is appropriate for outpatient follow up.        Collaboration of Care: Collaboration of Care: Other reviewed notes in Epic  Patient/Guardian was advised Release of Information must be obtained prior to any record release in order to collaborate their care with an outside provider. Patient/Guardian was advised if they have not already done so to contact the registration department to sign all necessary forms in order for [place] to release information regarding their care.   Consent: Patient/Guardian gives verbal consent for treatment and assignment of benefits for services provided during this visit. Patient/Guardian expressed understanding and agreed to proceed.    Angelica Clay, MD 10/20/2021, 10:29 AM

## 2021-10-20 ENCOUNTER — Encounter: Payer: Self-pay | Admitting: Psychiatry

## 2021-10-20 ENCOUNTER — Telehealth (INDEPENDENT_AMBULATORY_CARE_PROVIDER_SITE_OTHER): Payer: Medicaid Other | Admitting: Psychiatry

## 2021-10-20 DIAGNOSIS — G4700 Insomnia, unspecified: Secondary | ICD-10-CM | POA: Diagnosis not present

## 2021-10-20 DIAGNOSIS — F331 Major depressive disorder, recurrent, moderate: Secondary | ICD-10-CM | POA: Diagnosis not present

## 2021-10-20 DIAGNOSIS — F411 Generalized anxiety disorder: Secondary | ICD-10-CM

## 2021-10-20 MED ORDER — BUPROPION HCL ER (XL) 300 MG PO TB24: 300.0000 mg | ORAL_TABLET | Freq: Every day | ORAL | 3 refills | Status: DC

## 2021-10-20 MED ORDER — CLONAZEPAM 0.5 MG PO TABS: 0.5000 mg | ORAL_TABLET | Freq: Every day | ORAL | 1 refills | Status: DC | PRN

## 2021-10-29 ENCOUNTER — Institutional Professional Consult (permissible substitution): Payer: Medicaid Other | Admitting: Neurology

## 2021-11-20 DIAGNOSIS — L811 Chloasma: Secondary | ICD-10-CM | POA: Insufficient documentation

## 2021-11-24 ENCOUNTER — Institutional Professional Consult (permissible substitution): Payer: Medicaid Other | Admitting: Neurology

## 2021-12-17 NOTE — Progress Notes (Deleted)
[place] MD/PA/NP OP Progress Note  12/17/2021 12:17 PM Angelica Lopez  MRN:  [id number]  Chief Complaint: No chief complaint on file.  HPI: *** Visit Diagnosis: No diagnosis found.  Past Psychiatric History: Please see initial evaluation for full details. I have reviewed the history. No updates at this time.     Past Medical History:  Past Medical History:  Diagnosis Date   Anxiety    Back pain    Depression    Hypertension    Kyphosis    Scheuermann's disease     Past Surgical History:  Procedure Laterality Date   CESAREAN SECTION     TONSILLECTOMY      Family Psychiatric History: Please see initial evaluation for full details. I have reviewed the history. No updates at this time.     Family History:  Family History  Problem Relation Age of Onset   Anxiety disorder Mother    Depression Mother    Hypertension Father    Diabetes Father    Anxiety disorder Father    Depression Father    Heart disease Maternal Grandfather        has had 3 open heart surgeries   Febrile seizures Daughter    Febrile seizures Daughter    Febrile seizures Daughter     Social History:  Social History   Socioeconomic History   Marital status: Legally Separated    Spouse name: Not on file   Number of children: 3   Years of education: Not on file   Highest education level: Not on file  Occupational History   Not on file  Tobacco Use   Smoking status: Every Day    Packs/day: 2.00    Years: 15.00    Total pack years: 30.00    Types: Cigarettes   Smokeless tobacco: Never  Vaping Use   Vaping Use: Never used  Substance and Sexual Activity   Alcohol use: No   Drug use: Yes    Types: Marijuana    Comment: twice a week   Sexual activity: Not Currently    Birth control/protection: I.U.D.  Other Topics Concern   Not on file  Social History Narrative   Not on file   Social Determinants of Health   Financial Resource Strain: High Risk (07/17/2019)   Overall Financial Resource  Strain (CARDIA)    Difficulty of Paying Living Expenses: Hard  Food Insecurity: Food Insecurity Present (07/17/2019)   Hunger Vital Sign    Worried About Running Out of Food in the Last Year: Sometimes true    Ran Out of Food in the Last Year: Sometimes true  Transportation Needs: No Transportation Needs (07/17/2019)   PRAPARE - Hydrologist (Medical): No    Lack of Transportation (Non-Medical): No  Physical Activity: Inactive (07/17/2019)   Exercise Vital Sign    Days of Exercise per Week: 0 days    Minutes of Exercise per Session: 0 min  Stress: Stress Concern Present (07/17/2019)   [place]    Feeling of Stress : Very much  Social Connections: Moderately Integrated (07/17/2019)   Social Connection and Isolation Panel [NHANES]    Frequency of Communication with Friends and Family: More than three times a week    Frequency of Social Gatherings with Friends and Family: More than three times a week    Attends Religious Services: 1 to 4 times per year    Active Member of [employer]  or Organizations: No    Attends Archivist Meetings: 1 to 4 times per year    Marital Status: Divorced    Allergies:  Allergies  Allergen Reactions   Codeine Anaphylaxis    Reaction:Tongue swells    Metabolic Disorder Labs: No results found for: "HGBA1C", "MPG" No results found for: "PROLACTIN" No results found for: "CHOL", "TRIG", "HDL", "CHOLHDL", "VLDL", "LDLCALC" Lab Results  Component Value Date   TSH 1.81 12/06/2017   TSH 1.422 05/29/2009    Therapeutic Level Labs: No results found for: "LITHIUM" No results found for: "VALPROATE" No results found for: "CBMZ"  Current Medications: Current Outpatient Medications  Medication Sig Dispense Refill   Buprenorphine HCl-Naloxone HCl 8-2 MG FILM Place 3 tablets under the tongue daily.     buPROPion (WELLBUTRIN XL) 300 MG 24 hr tablet Take 1 tablet (300  mg total) by mouth daily. 30 tablet 3   cetirizine (ZYRTEC ALLERGY) 10 MG tablet Take 1 tablet (10 mg total) by mouth daily. 30 tablet 0   clonazePAM (KLONOPIN) 0.5 MG tablet Take 1 tablet (0.5 mg total) by mouth daily as needed for anxiety. 15 tabs per month 15 tablet 1   fluticasone (FLONASE) 50 MCG/ACT nasal spray Place 1 spray into both nostrils daily for 14 days. 16 g 0   ibuprofen (ADVIL) 800 MG tablet Take 1 tablet (800 mg total) by mouth 3 (three) times daily. 21 tablet 0   levonorgestrel (MIRENA) 20 MCG/24HR IUD 1 each by Intrauterine route once.     lisinopril (ZESTRIL) 40 MG tablet Take 40 mg by mouth daily.     ondansetron (ZOFRAN-ODT) 4 MG disintegrating tablet Take 1 tablet (4 mg total) by mouth every 8 (eight) hours as needed for nausea or vomiting. 20 tablet 0   sertraline (ZOLOFT) 100 MG tablet Take 1 tablet (100 mg total) by mouth daily. 30 tablet 5   No current facility-administered medications for this visit.     Musculoskeletal: Strength & Muscle Tone: within normal limits Gait & Station: normal Patient leans: N/A  Psychiatric Specialty Exam: Review of Systems  There were no vitals taken for this visit.There is no height or weight on file to calculate BMI.  General Appearance: {Appearance:22683}  Eye Contact:  {BHH EYE CONTACT:22684}  Speech:  Clear and Coherent  Volume:  Normal  Mood:  {BHH MOOD:22306}  Affect:  {Affect (PAA):22687}  Thought Process:  Coherent  Orientation:  Full (Time, Place, and Person)  Thought Content: Logical   Suicidal Thoughts:  {ST/HT (PAA):22692}  Homicidal Thoughts:  {ST/HT (PAA):22692}  Memory:  Immediate;   Good  Judgement:  {Judgement (PAA):22694}  Insight:  {Insight (PAA):22695}  Psychomotor Activity:  Normal  Concentration:  Concentration: Good and Attention Span: Good  Recall:  Good  Fund of Knowledge: Good  Language: Good  Akathisia:  No  Handed:  Right  AIMS (if indicated): not done  Assets:  Communication  Skills Desire for Improvement  ADL's:  Intact  Cognition: WNL  Sleep:  {BHH GOOD/FAIR/POOR:22877}   Screenings: GAD-7    Flowsheet Row Office Visit from 07/17/2019 in [place]  Total GAD-7 Score 6      PHQ2-9    [place] Office Visit from 08/14/2021 in [place] Video Visit from 03/24/2021 in [place] Video Visit from 09/25/2020 in [place] Video Visit from 06/28/2020 in [place] Video Visit from 04/01/2020 in [place]  PHQ-2 Total Score  $'2 2 2 2 2  'v$ PHQ-9 Total Score '11 4 6 3 10      '$ Flowsheet Row Office Visit from 08/14/2021 in [place] [facility] from 07/30/2021 in [place] [facility] from 11/09/2020 in [facility] [place] No Risk No Risk No Risk        Assessment and Plan:  Angelica Lopez is a 41 y.o. 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   1. GAD (generalized anxiety disorder) 2. MDD (major depressive disorder), recurrent episode, moderate ([place]) Although she reports depressive symptoms and then anxiety, it has been overall improving since the last visit.   Psychosocial stressors includes financial strain, grief of loss of her mother in 2021, her father, who has been incarcerated.  Will continue current dose of sertraline to target depression and anxiety.  Will continue bupropion as adjunctive treatment for depression.  Will continue clonazepam as needed for anxiety.    3. Insomnia, unspecified type Improving. She reports snoring, daytime fatigue and middle insomnia.  Referral was made for evaluation of sleep apnea.    3. Opioid  dependence in remission [facility]) She is on Suboxone, prescribed by PCP.  Will continue motivational interview.    4. Cocaine abuse ([place]) # Marijuana use Improving. She has cut down marijuana use.  She has been abstinent from cocaine use.  Will continue motivational interview.    Plan Continue sertraline 100 mg daily (worsening in fatigue at higher dose) Continue bupropion 300 mg daily (had seizure when she was a child) Continue clonazepam 0.5 mg daily as needed, 15 tabs per month Next appointment-  11/13 at 11:30 for 30 mins, in person  [email or web address]  - gabapentin 300 mg at night (limited to take at night only as it causes drowsiness) - Discussed attendance policy - she is on clonidine, prescribed by PCP   Past trials of medication: bupropion, Buspar, hydroxyzine, quetiapine/drowsiness, diazepam   The patient demonstrates the following risk factors for suicide: Chronic risk factors for suicide include: psychiatric disorder of depression, anxiety and substance use disorder. Acute risk factors for suicide include: family or marital conflict and unemployment. Protective factors for this patient include: responsibility to others (children, family), coping skills and hope for the future. Considering these factors, the overall suicide risk at this point appears to be low. Patient is appropriate for outpatient follow up.            Collaboration of Care: Collaboration of Care: {BH OP Collaboration of Care:21014065}  Patient/Guardian was advised Release of Information must be obtained prior to any record release in order to collaborate their care with an outside provider. Patient/Guardian was advised if they have not already done so to contact the registration department to sign all necessary forms in order for [place] to release information regarding their care.   Consent: Patient/Guardian gives verbal consent for treatment and assignment of benefits for services provided during this visit.  Patient/Guardian expressed understanding and agreed to proceed.    [name], MD 12/17/2021, 12:17 PM

## 2021-12-22 ENCOUNTER — Telehealth (INDEPENDENT_AMBULATORY_CARE_PROVIDER_SITE_OTHER): Payer: Medicaid Other | Admitting: Psychiatry

## 2021-12-22 ENCOUNTER — Ambulatory Visit: Payer: Medicaid Other | Admitting: Psychiatry

## 2021-12-22 ENCOUNTER — Encounter: Payer: Self-pay | Admitting: Psychiatry

## 2021-12-22 DIAGNOSIS — F331 Major depressive disorder, recurrent, moderate: Secondary | ICD-10-CM

## 2021-12-22 DIAGNOSIS — G4700 Insomnia, unspecified: Secondary | ICD-10-CM

## 2021-12-22 DIAGNOSIS — F411 Generalized anxiety disorder: Secondary | ICD-10-CM

## 2021-12-22 MED ORDER — CLONAZEPAM 0.5 MG PO TABS: 0.5000 mg | ORAL_TABLET | Freq: Every day | ORAL | 1 refills | Status: DC | PRN

## 2021-12-22 MED ORDER — BUPROPION HCL ER (XL) 300 MG PO TB24: 300.0000 mg | ORAL_TABLET | Freq: Every day | ORAL | 3 refills | Status: DC

## 2021-12-22 NOTE — Patient Instructions (Signed)
Continue sertraline 100 mg daily  Continue bupropion 300 mg daily  Continue clonazepam 0.5 mg daily as needed, 15 tabs per month Next appointment-  1/18 at 10 AM

## 2021-12-22 NOTE — Progress Notes (Signed)
Virtual Visit via Video Note  I connected with Angelica Lopez on 12/22/21 at 12:00 PM EST by a video enabled telemedicine application and verified that I am speaking with the correct person using two identifiers.  Location: Patient: home Provider: office Persons participated in the visit- patient, provider    I discussed the limitations of evaluation and management by telemedicine and the availability of in person appointments. The patient expressed understanding and agreed to proceed.      I discussed the assessment and treatment plan with the patient. The patient was provided an opportunity to ask questions and all were answered. The patient agreed with the plan and demonstrated an understanding of the instructions.   The patient was advised to call back or seek an in-person evaluation if the symptoms worsen or if the condition fails to improve as anticipated.  I provided 17 minutes of non-face-to-face time during this encounter.   [name], MD     [facility] MD/PA/NP OP Progress Note  12/22/2021 12:18 PM Angelica Lopez  MRN:  [id number]  Chief Complaint:  Chief Complaint  Patient presents with   Follow-up   Depression   HPI:  This is a follow-up appointment for depression and anxiety.  She states that her father was missing since he was released from jail.  Although he used to call her on his birthday, he did not contact her at all.  She received a call yesterday.  He states that he could not find anybody to use the phone to make a phone call.  Although she does not believe it, stating that he was likely high, she feels relieved to know that he is alive.  She states that her disability was denied.  She has been applying for a job, although she does not think she can work due to back pain.  She was conflict with her oldest daughter.  She believes her daughter cannot get over the past, stating that she was addicted to opiates.  She was told by her daughter that her daughter  would not let her stay even if K[place]ne were to be homeless.  She felt hurt by this.  She feels depressed and anxious at times especially when her father was missing.  Although she continues to have hand tremors, it improves with clonazepam.  She denies change in appetite.  She denies SI, HI.  She denies alcohol use or cocaine use.  Although she used marijuana a few weeks ago for anxiety, she thinks she has been doing better in cutting down the frequency as she used to take it every day.  She feels comfortable to stay on the current medication regimen as at this.   Daily routine: takes care of her children Employment: unemployed, using child support Support: neighbor Household:  80 yo old daughter in 2022  Marital status:  Divorced in 2016, used to be married for 15 years Number of children: 38, - 30 yo, 65 yo, middle one is at [employer], oldest daughter works at [employer]  Visit Diagnosis:    ICD-10-CM   1. MDD (major depressive disorder), recurrent episode, moderate (HCC)  F33.1     2. GAD (generalized anxiety disorder)  F41.1 clonazePAM (KLONOPIN) 0.5 MG tablet    3. Insomnia, unspecified type  G47.00       Past Psychiatric History: Please see initial evaluation for full details. I have reviewed the history. No updates at this time.     Past Medical History:  Past Medical History:  Diagnosis Date   Anxiety    Back pain    Depression    Hypertension    Kyphosis    Scheuermann's disease     Past Surgical History:  Procedure Laterality Date   CESAREAN SECTION     TONSILLECTOMY      Family Psychiatric History: Please see initial evaluation for full details. I have reviewed the history. No updates at this time.     Family History:  Family History  Problem Relation Age of Onset   Anxiety disorder Mother    Depression Mother    Hypertension Father    Diabetes Father    Anxiety disorder Father    Depression Father    Heart disease Maternal Grandfather        has had 3  open heart surgeries   Febrile seizures Daughter    Febrile seizures Daughter    Febrile seizures Daughter     Social History:  Social History   Socioeconomic History   Marital status: Legally Separated    Spouse name: Not on file   Number of children: 3   Years of education: Not on file   Highest education level: Not on file  Occupational History   Not on file  Tobacco Use   Smoking status: Every Day    Packs/day: 2.00    Years: 15.00    Total pack years: 30.00    Types: Cigarettes   Smokeless tobacco: Never  Vaping Use   Vaping Use: Never used  Substance and Sexual Activity   Alcohol use: No   Drug use: Yes    Types: Marijuana    Comment: twice a week   Sexual activity: Not Currently    Birth control/protection: I.U.D.  Other Topics Concern   Not on file  Social History Narrative   Not on file   Social Determinants of Health   Financial Resource Strain: High Risk (07/17/2019)   Overall Financial Resource Strain (CARDIA)    Difficulty of Paying Living Expenses: Hard  Food Insecurity: Food Insecurity Present (07/17/2019)   Hunger Vital Sign    Worried About Running Out of Food in the Last Year: Sometimes true    Ran Out of Food in the Last Year: Sometimes true  Transportation Needs: No Transportation Needs (07/17/2019)   PRAPARE - Hydrologist (Medical): No    Lack of Transportation (Non-Medical): No  Physical Activity: Inactive (07/17/2019)   Exercise Vital Sign    Days of Exercise per Week: 0 days    Minutes of Exercise per Session: 0 min  Stress: Stress Concern Present (07/17/2019)   [place]    Feeling of Stress : Very much  Social Connections: Moderately Integrated (07/17/2019)   Social Connection and Isolation Panel [NHANES]    Frequency of Communication with Friends and Family: More than three times a week    Frequency of Social Gatherings with Friends and Family:  More than three times a week    Attends Religious Services: 1 to 4 times per year    Active Member of [employer] or Organizations: No    Attends Archivist Meetings: 1 to 4 times per year    Marital Status: Divorced    Allergies:  Allergies  Allergen Reactions   Codeine Anaphylaxis    Reaction:Tongue swells    Metabolic Disorder Labs: No results found for: "HGBA1C", "MPG" No results found for: "PROLACTIN" No results found for: "CHOL", "TRIG", "  HDL", "CHOLHDL", "VLDL", "LDLCALC" Lab Results  Component Value Date   TSH 1.81 12/06/2017   TSH 1.422 05/29/2009    Therapeutic Level Labs: No results found for: "LITHIUM" No results found for: "VALPROATE" No results found for: "CBMZ"  Current Medications: Current Outpatient Medications  Medication Sig Dispense Refill   Buprenorphine HCl-Naloxone HCl 8-2 MG FILM Place 3 tablets under the tongue daily.     [START ON 02/22/2022] buPROPion (WELLBUTRIN XL) 300 MG 24 hr tablet Take 1 tablet (300 mg total) by mouth daily. 30 tablet 3   cetirizine (ZYRTEC ALLERGY) 10 MG tablet Take 1 tablet (10 mg total) by mouth daily. 30 tablet 0   [START ON 01/05/2022] clonazePAM (KLONOPIN) 0.5 MG tablet Take 1 tablet (0.5 mg total) by mouth daily as needed for anxiety. 15 tabs per month 15 tablet 1   fluticasone (FLONASE) 50 MCG/ACT nasal spray Place 1 spray into both nostrils daily for 14 days. 16 g 0   ibuprofen (ADVIL) 800 MG tablet Take 1 tablet (800 mg total) by mouth 3 (three) times daily. 21 tablet 0   levonorgestrel (MIRENA) 20 MCG/24HR IUD 1 each by Intrauterine route once.     lisinopril (ZESTRIL) 40 MG tablet Take 40 mg by mouth daily.     ondansetron (ZOFRAN-ODT) 4 MG disintegrating tablet Take 1 tablet (4 mg total) by mouth every 8 (eight) hours as needed for nausea or vomiting. 20 tablet 0   sertraline (ZOLOFT) 100 MG tablet Take 1 tablet (100 mg total) by mouth daily. 30 tablet 5   No current facility-administered medications for this  visit.     Musculoskeletal: Strength & Muscle Tone:  N/A Gait & Station:  N/A Patient leans: N/A  Psychiatric Specialty Exam: Review of Systems  Psychiatric/Behavioral:  Positive for dysphoric mood and sleep disturbance. Negative for agitation, behavioral problems, confusion, decreased concentration, hallucinations, self-injury and suicidal ideas. The patient is nervous/anxious. The patient is not hyperactive.   All other systems reviewed and are negative.   There were no vitals taken for this visit.There is no height or weight on file to calculate BMI.  General Appearance: Fairly Groomed  Eye Contact:  Good  Speech:  Clear and Coherent  Volume:  Normal  Mood:  Anxious  Affect:  Appropriate, Congruent, and tearful at times  Thought Process:  Coherent  Orientation:  Full (Time, Place, and Person)  Thought Content: Logical   Suicidal Thoughts:  No  Homicidal Thoughts:  No  Memory:  Immediate;   Good  Judgement:  Good  Insight:  Fair  Psychomotor Activity:  Normal  Concentration:  Concentration: Good and Attention Span: Good  Recall:  Good  Fund of Knowledge: Good  Language: Good  Akathisia:  No  Handed:  Right  AIMS (if indicated): not done  Assets:  Communication Skills Desire for Improvement  ADL's:  Intact  Cognition: WNL  Sleep:  Fair   Screenings: GAD-7    [place] [facility]  Total GAD-7 Score 6      PHQ2-9    [place] Office Visit from 08/14/2021 in [place] Video Visit from 03/24/2021 in [place] Video Visit from 09/25/2020 in [place] Video Visit from 06/28/2020 in [place] Video Visit from 04/01/2020 in [place]  PHQ-2 Total Score '2 2 2 2 2  '$ PHQ-9 Total Score '11 4 6 3 10      '$ Flowsheet  [place] Office Visit from 08/14/2021 in [place] [facility] from 07/30/2021 in [place] [facility] from 11/09/2020 in [facility] [place] No Risk No Risk No Risk        Assessment and Plan:  Angelica Lopez is a 41 y.o. 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   1. GAD (generalized anxiety disorder) 2. MDD (major depressive disorder), recurrent episode, moderate ([place]) Although she reports anxiety and depressive symptoms in the context of situation with her father, it has been relatively manageable since the last visit.  Psychosocial stressors includes financial strain, grief of loss of her mother in [id number], and conflict with her oldest daughter.  Will continue current dose of sertraline to target depression and anxiety.  Will continue bupropion as adjunctive treatment for depression.  Will continue clonazepam as needed for anxiety.  Noted that although the combination of Suboxone/clonazepam is not preferable due to potential risk of respiratory suppression, and its risk of dependence, she reports significant improvement in functioning since being on this clonazepam.  Will continue to judiciously prescribe this medication.   3. Insomnia, unspecified type Improving.  Although referral was made for evaluation of sleep apnea due to daytime fatigue and middle insomnia, snoring, she has not been able to make an appointment due to scheduling issues.  She is willing to contact the clinic when feasible.     3. Opioid dependence in remission [facility]) She is on Suboxone, prescribed by PCP.  Will continue motivational interview.    4. Cocaine abuse ([place]) # Marijuana use Improving. She has cut down marijuana use.  She has been abstinent from cocaine use.  Will continue motivational interview.    Plan Continue  sertraline 100 mg daily (worsening in fatigue at higher dose) Continue bupropion 300 mg daily (had seizure when she was a child) Continue clonazepam 0.5 mg daily as needed, 15 tabs per month Next appointment-  1/18 at 10 AM, in person  [email or web address]  - gabapentin 300 mg at night (limited to take at night only as it causes drowsiness) - Discussed attendance policy - she is on clonidine, prescribed by PCP   Past trials of medication: bupropion, Buspar, hydroxyzine, quetiapine/drowsiness, diazepam   The patient demonstrates the following risk factors for suicide: Chronic risk factors for suicide include: psychiatric disorder of depression, anxiety and substance use disorder. Acute risk factors for suicide include: family or marital conflict and unemployment. Protective factors for this patient include: responsibility to others (children, family), coping skills and hope for the future. Considering these factors, the overall suicide risk at this point appears to be low. Patient is appropriate for outpatient follow up.         Collaboration of Care: Collaboration of Care: Other reviewed notes in Epic  Patient/Guardian was advised Release of Information must be obtained prior to any record release in order to collaborate their care with an outside provider. Patient/Guardian was advised if they have not already done so to contact the registration department to sign all necessary forms in order for [place] to release information regarding their care.   Consent: Patient/Guardian gives verbal consent for treatment and assignment of benefits for services provided during this visit. Patient/Guardian expressed understanding and agreed to proceed.    [name], MD 12/22/2021, 12:18 PM

## 2022-02-24 NOTE — Progress Notes (Signed)
Virtual Visit via Video Note  I connected with Angelica Lopez on 02/26/22 at 10:00 AM EST by a video enabled telemedicine application and verified that I am speaking with the correct person using two identifiers.  Location: Patient: home Provider: office Persons participated in the visit- patient, provider    I discussed the limitations of evaluation and management by telemedicine and the availability of in person appointments. The patient expressed understanding and agreed to proceed.   I discussed the assessment and treatment plan with the patient. The patient was provided an opportunity to ask questions and all were answered. The patient agreed with the plan and demonstrated an understanding of the instructions.   The patient was advised to call back or seek an in-person evaluation if the symptoms worsen or if the condition fails to improve as anticipated.  I provided 15 minutes of non-face-to-face time during this encounter.   Angelica Clay, MD    [facility] MD/PA/NP OP Progress Note  02/26/2022 10:32 AM [place]  MRN:  [id number]  Chief Complaint:  Chief Complaint  Patient presents with   Follow-up   HPI:  This is a follow-up appointment for depression.  She apologizes not be able to, in person visit due to her daughter, who has repeated ear infection.  She states that she has been feeling extra depressed lately.  She also tends to overthink.  She has been snappy at neighbors, her children, although she denies any physical aggression.  She states that this is not normal, and she does not know where it is coming from.  She had a good Christmas with her 3 girls.  She is trying to find a job due to financial strain.  There is a change in management, and she needs to pay more for public housing.  She still communicating with case manager/police as her father is still missing.  She states that although she used to talk with her mother about anything, she has not been able to find  anybody to do it since her mother passed.  She sleeps well.  She denies change in appetite.  She denies SI.  She denies alcohol use.  She used marijuana once in the past 2 months as she was stressed.  She is willing to try lamotrigine at this time.   Daily routine: takes care of her children Employment: unemployed, using child support Support: neighbor Household:  41 yo old daughter in 2022  Marital status:  Divorced in 2016, used to be married for 15 years Number of children: 44, - 30 yo, 41 yo, middle one is at [employer], oldest daughter works at [employer] She describes her childhood as good with good support from her mother  Visit Diagnosis:    ICD-10-CM   1. MDD (major depressive disorder), recurrent episode, moderate (HCC)  F33.1     2. GAD (generalized anxiety disorder)  F41.1 clonazePAM (KLONOPIN) 0.5 MG tablet    3. Insomnia, unspecified type  G47.00       Past Psychiatric History: Please see initial evaluation for full details. I have reviewed the history. No updates at this time.     Past Medical History:  Past Medical History:  Diagnosis Date   Anxiety    Back pain    Depression    Hypertension    Kyphosis    Scheuermann's disease     Past Surgical History:  Procedure Laterality Date   CESAREAN SECTION     TONSILLECTOMY  Family Psychiatric History: Please see initial evaluation for full details. I have reviewed the history. No updates at this time.     Family History:  Family History  Problem Relation Age of Onset   Anxiety disorder Mother    Depression Mother    Hypertension Father    Diabetes Father    Anxiety disorder Father    Depression Father    Heart disease Maternal Grandfather        has had 3 open heart surgeries   Febrile seizures Daughter    Febrile seizures Daughter    Febrile seizures Daughter     Social History:  Social History   Socioeconomic History   Marital status: Legally Separated    Spouse name: Not on file    Number of children: 3   Years of education: Not on file   Highest education level: Not on file  Occupational History   Not on file  Tobacco Use   Smoking status: Every Day    Packs/day: 2.00    Years: 15.00    Total pack years: 30.00    Types: Cigarettes   Smokeless tobacco: Never  Vaping Use   Vaping Use: Never used  Substance and Sexual Activity   Alcohol use: No   Drug use: Yes    Types: Marijuana    Comment: twice a week   Sexual activity: Not Currently    Birth control/protection: I.U.D.  Other Topics Concern   Not on file  Social History Narrative   Not on file   Social Determinants of Health   Financial Resource Strain: High Risk (07/17/2019)   Overall Financial Resource Strain (CARDIA)    Difficulty of Paying Living Expenses: Hard  Food Insecurity: Food Insecurity Present (07/17/2019)   Hunger Vital Sign    Worried About Running Out of Food in the Last Year: Sometimes true    Ran Out of Food in the Last Year: Sometimes true  Transportation Needs: No Transportation Needs (07/17/2019)   PRAPARE - Hydrologist (Medical): No    Lack of Transportation (Non-Medical): No  Physical Activity: Inactive (07/17/2019)   Exercise Vital Sign    Days of Exercise per Week: 0 days    Minutes of Exercise per Session: 0 min  Stress: Stress Concern Present (07/17/2019)   [place]    Feeling of Stress : Very much  Social Connections: Moderately Integrated (07/17/2019)   Social Connection and Isolation Panel [NHANES]    Frequency of Communication with Friends and Family: More than three times a week    Frequency of Social Gatherings with Friends and Family: More than three times a week    Attends Religious Services: 1 to 4 times per year    Active Member of [employer] or Organizations: No    Attends Archivist Meetings: 1 to 4 times per year    Marital Status: Divorced    Allergies:   Allergies  Allergen Reactions   Codeine Anaphylaxis    Reaction:Tongue swells    Metabolic Disorder Labs: No results found for: "HGBA1C", "MPG" No results found for: "PROLACTIN" No results found for: "CHOL", "TRIG", "HDL", "CHOLHDL", "VLDL", "LDLCALC" Lab Results  Component Value Date   TSH 1.81 12/06/2017   TSH 1.422 05/29/2009    Therapeutic Level Labs: No results found for: "LITHIUM" No results found for: "VALPROATE" No results found for: "CBMZ"  Current Medications: Current Outpatient Medications  Medication Sig Dispense Refill  lamoTRIgine (LAMICTAL) 25 MG tablet Take 1 tablet (25 mg total) by mouth daily. 30 tablet 1   Buprenorphine HCl-Naloxone HCl 8-2 MG FILM Place 3 tablets under the tongue daily.     buPROPion (WELLBUTRIN XL) 300 MG 24 hr tablet Take 1 tablet (300 mg total) by mouth daily. 30 tablet 3   cetirizine (ZYRTEC ALLERGY) 10 MG tablet Take 1 tablet (10 mg total) by mouth daily. 30 tablet 0   [START ON 03/06/2022] clonazePAM (KLONOPIN) 0.5 MG tablet Take 1 tablet (0.5 mg total) by mouth daily as needed for anxiety. 15 tabs per month 15 tablet 1   fluticasone (FLONASE) 50 MCG/ACT nasal spray Place 1 spray into both nostrils daily for 14 days. 16 g 0   ibuprofen (ADVIL) 800 MG tablet Take 1 tablet (800 mg total) by mouth 3 (three) times daily. 21 tablet 0   levonorgestrel (MIRENA) 20 MCG/24HR IUD 1 each by Intrauterine route once.     lisinopril (ZESTRIL) 40 MG tablet Take 40 mg by mouth daily.     ondansetron (ZOFRAN-ODT) 4 MG disintegrating tablet Take 1 tablet (4 mg total) by mouth every 8 (eight) hours as needed for nausea or vomiting. 20 tablet 0   [START ON 03/23/2022] sertraline (ZOLOFT) 100 MG tablet Take 1 tablet (100 mg total) by mouth daily. 30 tablet 5   No current facility-administered medications for this visit.     Musculoskeletal: Strength & Muscle Tone:  N/A Gait & Station:  N/A Patient leans: N/A  Psychiatric Specialty Exam: Review of  Systems  Psychiatric/Behavioral:  Positive for dysphoric mood. Negative for agitation, behavioral problems, confusion, decreased concentration, hallucinations, self-injury, sleep disturbance and suicidal ideas. The patient is nervous/anxious. The patient is not hyperactive.   All other systems reviewed and are negative.   There were no vitals taken for this visit.There is no height or weight on file to calculate BMI.  General Appearance: Fairly Groomed  Eye Contact:  Good  Speech:  Clear and Coherent  Volume:  Normal  Mood:  Irritable  Affect:  Appropriate, Congruent, and calm  Thought Process:  Coherent  Orientation:  Full (Time, Place, and Person)  Thought Content: Logical   Suicidal Thoughts:  No  Homicidal Thoughts:  No  Memory:  Immediate;   Good  Judgement:  Good  Insight:  Good  Psychomotor Activity:  Normal  Concentration:  Concentration: Good and Attention Span: Good  Recall:  Good  Fund of Knowledge: Good  Language: Good  Akathisia:  No  Handed:  Right  AIMS (if indicated): not done  Assets:  Communication Skills Desire for Improvement  ADL's:  Intact  Cognition: WNL  Sleep:  Good   Screenings: GAD-7    Flowsheet Row Office Visit from 07/17/2019 in [place]  Total GAD-7 Score 6      PHQ2-9    [place] Office Visit from 08/14/2021 in [place] Video Visit from 03/24/2021 in [place] Video Visit from 09/25/2020 in [place] Video Visit from 06/28/2020 in [place] Video Visit from 04/01/2020 in [place]  PHQ-2 Total Score '2 2 2 2 2  '$ PHQ-9 Total Score '11 4 6 3 10      '$ [place] Office Visit from 08/14/2021 in [place] [facility] from 07/30/2021 in [place] [facility] from 11/09/2020 in [facility] [place] No Risk No  Risk No  Risk        Assessment and Plan:  Angelica Lopez is a 42 y.o. 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   1. GAD (generalized anxiety disorder) 2. MDD (major depressive disorder), recurrent episode, moderate (HCC) Acute stressors: financial strain (searching for a job), her father is missing (communicating with [place] police) Chronic stressors: loss of her supportive mother in April 2021, occasional conflict with her oldest daughter, her father who has been in and out of jail There has been worsening in anxiety and irritability since the last visit.  Will add lamotrigine to target depression/irritability.  Discussed potential risk of Stevens-Johnson syndrome.  Will continue sertraline and bupropion at the current dose to target depression and anxiety.  Will continue clonazepam as needed for anxiety. Noted that although the combination of Suboxone/clonazepam is not preferable due to potential risk of respiratory suppression, and its risk of dependence, she reports significant improvement in functioning since being on this clonazepam.  Will continue to judiciously prescribe this medication.   3. Insomnia, unspecified type Stable.  Although referral was made for evaluation of sleep apnea due to daytime fatigue and middle insomnia, snoring, she has not been able to make an appointment due to scheduling issues.  She is willing to contact the clinic when feasible.   3. Opioid dependence in remission [facility]) She is on Suboxone, prescribed by PCP.  Will continue motivational interview.    4. Cocaine abuse ([place]) # Marijuana use Unchanged, although she has cut down marijuana use compared to before.  She has been abstinent from cocaine use.  Will continue motivational interview.    Plan Continue sertraline  100 mg daily (worsening in fatigue at higher dose) Continue bupropion 300 mg daily (had seizure when she was a child) Start lamotrigine 25 mg daily  Continue clonazepam 0.5 mg daily as needed, 15 tabs per month Next appointment-  2/21 at 3:30, video [email or web address]  - gabapentin 300 mg at night (limited to take at night only as it causes drowsiness) - Discussed attendance policy - she is on clonidine, prescribed by PCP   Past trials of medication: bupropion, Buspar, hydroxyzine, quetiapine/drowsiness, diazepam   The patient demonstrates the following risk factors for suicide: Chronic risk factors for suicide include: psychiatric disorder of depression, anxiety and substance use disorder. Acute risk factors for suicide include: family or marital conflict and unemployment. Protective factors for this patient include: responsibility to others (children, family), coping skills and hope for the future. Considering these factors, the overall suicide risk at this point appears to be low. Patient is appropriate for outpatient follow up.         Collaboration of Care: Collaboration of Care: Other reviewed notes in Epic  Patient/Guardian was advised Release of Information must be obtained prior to any record release in order to collaborate their care with an outside provider. Patient/Guardian was advised if they have not already done so to contact the registration department to sign all necessary forms in order for [place] to release information regarding their care.   Consent: Patient/Guardian gives verbal consent for treatment and assignment of benefits for services provided during this visit. Patient/Guardian expressed understanding and agreed to proceed.    Angelica Clay, MD 02/26/2022, 10:32 AM

## 2022-02-26 ENCOUNTER — Telehealth (INDEPENDENT_AMBULATORY_CARE_PROVIDER_SITE_OTHER): Payer: Medicaid Other | Admitting: Psychiatry

## 2022-02-26 ENCOUNTER — Encounter: Payer: Self-pay | Admitting: Psychiatry

## 2022-02-26 DIAGNOSIS — F411 Generalized anxiety disorder: Secondary | ICD-10-CM

## 2022-02-26 DIAGNOSIS — F331 Major depressive disorder, recurrent, moderate: Secondary | ICD-10-CM | POA: Diagnosis not present

## 2022-02-26 DIAGNOSIS — G4700 Insomnia, unspecified: Secondary | ICD-10-CM

## 2022-02-26 MED ORDER — SERTRALINE HCL 100 MG PO TABS: 100.0000 mg | ORAL_TABLET | Freq: Every day | ORAL | 5 refills | Status: DC

## 2022-02-26 MED ORDER — LAMOTRIGINE 25 MG PO TABS
25.0000 mg | ORAL_TABLET | Freq: Every day | ORAL | 1 refills | Status: DC
Start: 2022-02-26 — End: 2022-04-01

## 2022-02-26 MED ORDER — CLONAZEPAM 0.5 MG PO TABS: 0.5000 mg | ORAL_TABLET | Freq: Every day | ORAL | 1 refills | Status: DC | PRN

## 2022-02-26 NOTE — Patient Instructions (Signed)
Continue sertraline 100 mg daily  Continue bupropion 300 mg daily  Start lamotrigine 25 mg daily  Continue clonazepam 0.5 mg daily as needed, 15 tabs per month Next appointment-  2/21 at 3:30

## 2022-03-29 NOTE — Progress Notes (Unsigned)
Virtual Visit via Video Note  I connected with Angelica Lopez on 04/01/22 at  3:30 PM EST by a video enabled telemedicine application and verified that I am speaking with the correct person using two identifiers.  Location: Patient: car Provider: office Persons participated in the visit- patient, provider    I discussed the limitations of evaluation and management by telemedicine and the availability of in person appointments. The patient expressed understanding and agreed to proceed.    I discussed the assessment and treatment plan with the patient. The patient was provided an opportunity to ask questions and all were answered. The patient agreed with the plan and demonstrated an understanding of the instructions.   The patient was advised to call back or seek an in-person evaluation if the symptoms worsen or if the condition fails to improve as anticipated.  I provided 17 minutes of non-face-to-face time during this encounter.   [name], MD    [facility] MD/PA/NP OP Progress Note  04/01/2022 4:01 PM Angelica Lopez  MRN:  [id number]  Chief Complaint:  Chief Complaint  Patient presents with   Follow-up   HPI:  This is a follow-up appointment for depression, anxiety and insomnia.  She states that she has been feeling on edge.  She had conflict with her neighbor, and she does not talk with her anymore.  She feels lonely.  She is hoping to do her trial for babysitting a 89-year-old.  There was a mother's birthday.  She was feeling down.  She reports conflict with her older daughter, who does not understand this.  She feels irritable at times.  She has occasional insomnia. She feels depressed and anxious.  She has fair appetite.  She denies alcohol use.  She used marijuana last month.  She could not continue lamotrigine as she was feeling drowsy. She is concerned about medication which can potentially cause tremors as she has tremors in her hands and legs. She agrees to stay on the  current medication regimen at this time.    Daily routine: takes care of her children Employment: unemployed, using child support Support: neighbor Household:  68 yo old daughter in 2022  Marital status:  Divorced in 2016, used to be married for 15 years Number of children: 68, - 109 yo, 21 yo, middle one is at [employer], oldest daughter works at [employer] She describes her childhood as good with good support from her mother  81.2 kg (179 lb) 03/23/2022   Wt Readings from Last 3 Encounters:  08/14/21 179 lb (81.2 kg)  07/30/21 180 lb (81.6 kg)  06/13/21 186 lb (84.4 kg)     Visit Diagnosis:    ICD-10-CM   1. MDD (major depressive disorder), recurrent episode, mild ([place])  F33.0     2. GAD (generalized anxiety disorder)  F41.1 clonazePAM (KLONOPIN) 0.5 MG tablet    3. Insomnia, unspecified type  G47.00       Past Psychiatric History: Please see initial evaluation for full details. I have reviewed the history. No updates at this time.     Past Medical History:  Past Medical History:  Diagnosis Date   Anxiety    Back pain    Depression    Hypertension    Kyphosis    Scheuermann's disease     Past Surgical History:  Procedure Laterality Date   CESAREAN SECTION     TONSILLECTOMY      Family Psychiatric History: Please see initial evaluation for full details. I have  reviewed the history. No updates at this time.     Family History:  Family History  Problem Relation Age of Onset   Anxiety disorder Mother    Depression Mother    Hypertension Father    Diabetes Father    Anxiety disorder Father    Depression Father    Heart disease Maternal Grandfather        has had 3 open heart surgeries   Febrile seizures Daughter    Febrile seizures Daughter    Febrile seizures Daughter     Social History:  Social History   Socioeconomic History   Marital status: Legally Separated    Spouse name: Not on file   Number of children: 3   Years of education: Not on file    Highest education level: Not on file  Occupational History   Not on file  Tobacco Use   Smoking status: Every Day    Packs/day: 2.00    Years: 15.00    Total pack years: 30.00    Types: Cigarettes   Smokeless tobacco: Never  Vaping Use   Vaping Use: Never used  Substance and Sexual Activity   Alcohol use: No   Drug use: Yes    Types: Marijuana    Comment: twice a week   Sexual activity: Not Currently    Birth control/protection: I.U.D.  Other Topics Concern   Not on file  Social History Narrative   Not on file   Social Determinants of Health   Financial Resource Strain: High Risk (07/17/2019)   Overall Financial Resource Strain (CARDIA)    Difficulty of Paying Living Expenses: Hard  Food Insecurity: Food Insecurity Present (07/17/2019)   Hunger Vital Sign    Worried About Running Out of Food in the Last Year: Sometimes true    Ran Out of Food in the Last Year: Sometimes true  Transportation Needs: No Transportation Needs (07/17/2019)   PRAPARE - Hydrologist (Medical): No    Lack of Transportation (Non-Medical): No  Physical Activity: Inactive (07/17/2019)   Exercise Vital Sign    Days of Exercise per Week: 0 days    Minutes of Exercise per Session: 0 min  Stress: Stress Concern Present (07/17/2019)   [place]    Feeling of Stress : Very much  Social Connections: Moderately Integrated (07/17/2019)   Social Connection and Isolation Panel [NHANES]    Frequency of Communication with Friends and Family: More than three times a week    Frequency of Social Gatherings with Friends and Family: More than three times a week    Attends Religious Services: 1 to 4 times per year    Active Member of [employer] or Organizations: No    Attends Archivist Meetings: 1 to 4 times per year    Marital Status: Divorced    Allergies:  Allergies  Allergen Reactions   Codeine Anaphylaxis     Reaction:Tongue swells    Metabolic Disorder Labs: No results found for: "HGBA1C", "MPG" No results found for: "PROLACTIN" No results found for: "CHOL", "TRIG", "HDL", "CHOLHDL", "VLDL", "LDLCALC" Lab Results  Component Value Date   TSH 1.81 12/06/2017   TSH 1.422 05/29/2009    Therapeutic Level Labs: No results found for: "LITHIUM" No results found for: "VALPROATE" No results found for: "CBMZ"  Current Medications: Current Outpatient Medications  Medication Sig Dispense Refill   Buprenorphine HCl-Naloxone HCl 8-2 MG FILM Place 3 tablets under  the tongue daily.     buPROPion (WELLBUTRIN XL) 300 MG 24 hr tablet Take 1 tablet (300 mg total) by mouth daily. 30 tablet 3   cetirizine (ZYRTEC ALLERGY) 10 MG tablet Take 1 tablet (10 mg total) by mouth daily. 30 tablet 0   [START ON 05/05/2022] clonazePAM (KLONOPIN) 0.5 MG tablet Take 1 tablet (0.5 mg total) by mouth daily as needed for anxiety. 15 tabs per month 15 tablet 1   fluticasone (FLONASE) 50 MCG/ACT nasal spray Place 1 spray into both nostrils daily for 14 days. 16 g 0   ibuprofen (ADVIL) 800 MG tablet Take 1 tablet (800 mg total) by mouth 3 (three) times daily. 21 tablet 0   levonorgestrel (MIRENA) 20 MCG/24HR IUD 1 each by Intrauterine route once.     lisinopril (ZESTRIL) 40 MG tablet Take 40 mg by mouth daily.     ondansetron (ZOFRAN-ODT) 4 MG disintegrating tablet Take 1 tablet (4 mg total) by mouth every 8 (eight) hours as needed for nausea or vomiting. 20 tablet 0   sertraline (ZOLOFT) 100 MG tablet Take 1 tablet (100 mg total) by mouth daily. 30 tablet 5   No current facility-administered medications for this visit.     Musculoskeletal: Strength & Muscle Tone:  N/A Gait & Station:  N/A Patient leans: N/A  Psychiatric Specialty Exam: Review of Systems  Psychiatric/Behavioral:  Positive for dysphoric mood and sleep disturbance. Negative for agitation, behavioral problems, confusion, decreased concentration,  hallucinations, self-injury and suicidal ideas. The patient is nervous/anxious. The patient is not hyperactive.   All other systems reviewed and are negative.   There were no vitals taken for this visit.There is no height or weight on file to calculate BMI.  General Appearance: Fairly Groomed  Eye Contact:  Good  Speech:  Clear and Coherent  Volume:  Normal  Mood:  Anxious and Depressed  Affect:  Appropriate, Congruent, and calm  Thought Process:  Coherent  Orientation:  Full (Time, Place, and Person)  Thought Content: Logical   Suicidal Thoughts:  No  Homicidal Thoughts:  No  Memory:  Immediate;   Good  Judgement:  Good  Insight:  Good  Psychomotor Activity:  Normal  Concentration:  Concentration: Good and Attention Span: Good  Recall:  Good  Fund of Knowledge: Good  Language: Good  Akathisia:  No  Handed:  Right  AIMS (if indicated): not done  Assets:  Communication Skills Desire for Improvement  ADL's:  Intact  Cognition: WNL  Sleep:  Poor   Screenings: GAD-7    Flowsheet Row Office Visit from 07/17/2019 in [facility] for [place] at [facility]  Total GAD-7 Score 6      PHQ2-9    [place] Office Visit from 08/14/2021 in [place] Video Visit from 03/24/2021 in [place] Video Visit from 09/25/2020 in [place] Video Visit from 06/28/2020 in [place] Video Visit from 04/01/2020 in [place]  PHQ-2 Total Score 2 2 2 2 2  $ PHQ-9 Total Score 11 4 6 3 10      $ [place] Office Visit from 08/14/2021 in [place] ED from 07/30/2021 in [facility] at [facility] ED from 11/09/2020 in [facility] Ur[facility] Care at [place] No Risk No Risk No Risk  Assessment and Plan:  Annelyse ANDILYNN GUARDIOLA is a 42 y.o. 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   1. GAD (generalized anxiety disorder) 2. MDD (major depressive disorder), recurrent episode, mild (HCC) Acute stressors: financial strain (searching for a job), her father is missing (communicating with [place] police) Chronic stressors: loneliness, loss of her supportive mother in April 2021, occasional conflict with her oldest daughter, her father who has been in and out of jail She reports mild depressive symptoms and anxiety since the last visit.  She discontinued lamotrigine due to adverse reaction of drowsiness.  Due to her reported tremors, she is not considered a suitable candidate to try antipsychotics.  She is willing to stay on the current medication regimen until the next in person visit.  Will continue sertraline and bupropion to target depression.  Will continue clonazepam as needed for anxiety. It is noted that although the combination of Suboxone and clonazepam is not preferable due to the potential risk of respiratory suppression and dependence, the patient reports significant improvement in functioning since being on clonazepam. Therefore, it is decided to continue judiciously prescribing this medication.  3. Insomnia, unspecified type She has occasional insomnia.  Referral was made for evaluation of sleep apnea due to daytime fatigue, middle insomnia and snoring.    3. Opioid dependence in remission [facility]) She is on Suboxone, prescribed by PCP.  Will continue motivational interview.    4. Cocaine abuse ([place]) # Marijuana use She continues to use marijuana.  She has abstained from using cocaine. Will continue motivational interview.    Plan Continue sertraline 100 mg daily (worsening in  fatigue at higher dose) Continue bupropion 300 mg daily (had seizure when she was a child) Continue clonazepam 0.5 mg daily as needed, 15 tabs per month Next appointment-  4/29 at 11:30, IP [email or web address]  - gabapentin 300 mg at night (limited to take at night only as it causes drowsiness) - Discussed attendance policy - she is on clonidine, prescribed by PCP   Past trials of medication: bupropion, Buspar, hydroxyzine, lamotrigine (dizziness), quetiapine/drowsiness, diazepam   The patient demonstrates the following risk factors for suicide: Chronic risk factors for suicide include: psychiatric disorder of depression, anxiety and substance use disorder. Acute risk factors for suicide include: family or marital conflict and unemployment. Protective factors for this patient include: responsibility to others (children, family), coping skills and hope for the future. Considering these factors, the overall suicide risk at this point appears to be low. Patient is appropriate for outpatient follow up.      Collaboration of Care: Collaboration of Care: Other reviewed notes in Epic  Patient/Guardian was advised Release of Information must be obtained prior to any record release in order to collaborate their care with an outside provider. Patient/Guardian was advised if they have not already done so to contact the registration department to sign all necessary forms in order for [place] to release information regarding their care.   Consent: Patient/Guardian gives verbal consent for treatment and assignment of benefits for services provided during this visit. Patient/Guardian expressed understanding and agreed to proceed.    [name], MD 04/01/2022, 4:01 PM

## 2022-04-01 ENCOUNTER — Telehealth (INDEPENDENT_AMBULATORY_CARE_PROVIDER_SITE_OTHER): Payer: Medicaid Other | Admitting: Psychiatry

## 2022-04-01 ENCOUNTER — Encounter: Payer: Self-pay | Admitting: Psychiatry

## 2022-04-01 DIAGNOSIS — F411 Generalized anxiety disorder: Secondary | ICD-10-CM

## 2022-04-01 DIAGNOSIS — F330 Major depressive disorder, recurrent, mild: Secondary | ICD-10-CM | POA: Diagnosis not present

## 2022-04-01 DIAGNOSIS — G4700 Insomnia, unspecified: Secondary | ICD-10-CM | POA: Diagnosis not present

## 2022-04-01 MED ORDER — CLONAZEPAM 0.5 MG PO TABS: 0.5000 mg | ORAL_TABLET | Freq: Every day | ORAL | 1 refills | Status: DC | PRN

## 2022-04-01 NOTE — Patient Instructions (Signed)
Continue sertraline 100 mg daily  Continue bupropion 300 mg daily  Continue clonazepam 0.5 mg daily as needed, 15 tabs per month Next appointment-  4/29 at 11:30

## 2022-04-24 ENCOUNTER — Other Ambulatory Visit: Payer: Self-pay | Admitting: Psychiatry

## 2022-04-27 ENCOUNTER — Other Ambulatory Visit: Payer: Self-pay | Admitting: Psychiatry

## 2022-05-28 ENCOUNTER — Other Ambulatory Visit: Payer: Self-pay | Admitting: Psychiatry

## 2022-05-28 DIAGNOSIS — F411 Generalized anxiety disorder: Secondary | ICD-10-CM

## 2022-06-05 NOTE — Progress Notes (Deleted)
BH MD/PA/NP OP Progress Note  06/05/2022 5:09 PM Angelica Lopez  MRN:  [id number]  Chief Complaint: No chief complaint on file.  HPI: *** Visit Diagnosis: No diagnosis found.  Past Psychiatric History: Please see initial evaluation for full details. I have reviewed the history. No updates at this time.     Past Medical History:  Past Medical History:  Diagnosis Date   Anxiety    Back pain    Depression    Hypertension    Kyphosis    Scheuermann's disease     Past Surgical History:  Procedure Laterality Date   CESAREAN SECTION     TONSILLECTOMY      Family Psychiatric History: Please see initial evaluation for full details. I have reviewed the history. No updates at this time.     Family History:  Family History  Problem Relation Age of Onset   Anxiety disorder Mother    Depression Mother    Hypertension Father    Diabetes Father    Anxiety disorder Father    Depression Father    Heart disease Maternal Grandfather        has had 3 open heart surgeries   Febrile seizures Daughter    Febrile seizures Daughter    Febrile seizures Daughter     Social History:  Social History   Socioeconomic History   Marital status: Legally Separated    Spouse name: Not on file   Number of children: 3   Years of education: Not on file   Highest education level: Not on file  Occupational History   Not on file  Tobacco Use   Smoking status: Every Day    Packs/day: 2.00    Years: 15.00    Additional pack years: 0.00    Total pack years: 30.00    Types: Cigarettes   Smokeless tobacco: Never  Vaping Use   Vaping Use: Never used  Substance and Sexual Activity   Alcohol use: No   Drug use: Yes    Types: Marijuana    Comment: twice a week   Sexual activity: Not Currently    Birth control/protection: I.U.D.  Other Topics Concern   Not on file  Social History Narrative   Not on file   Social Determinants of Health   Financial Resource Strain: High Risk (07/17/2019)    Overall Financial Resource Strain (CARDIA)    Difficulty of Paying Living Expenses: Hard  Food Insecurity: Food Insecurity Present (07/17/2019)   Hunger Vital Sign    Worried About Running Out of Food in the Last Year: Sometimes true    Ran Out of Food in the Last Year: Sometimes true  Transportation Needs: No Transportation Needs (07/17/2019)   PRAPARE - Administrator, Civil Service (Medical): No    Lack of Transportation (Non-Medical): No  Physical Activity: Inactive (07/17/2019)   Exercise Vital Sign    Days of Exercise per Week: 0 days    Minutes of Exercise per Session: 0 min  Stress: Stress Concern Present (07/17/2019)   [employer] of Occupational Health - Occupational Stress Questionnaire    Feeling of Stress : Very much  Social Connections: Moderately Integrated (07/17/2019)   Social Connection and Isolation Panel [NHANES]    Frequency of Communication with Friends and Family: More than three times a week    Frequency of Social Gatherings with Friends and Family: More than three times a week    Attends Religious Services: 1 to 4 times per year  Active Member of Clubs or Organizations: No    Attends Banker Meetings: 1 to 4 times per year    Marital Status: Divorced    Allergies:  Allergies  Allergen Reactions   Codeine Anaphylaxis    Reaction:Tongue swells    Metabolic Disorder Labs: No results found for: "HGBA1C", "MPG" No results found for: "PROLACTIN" No results found for: "CHOL", "TRIG", "HDL", "CHOLHDL", "VLDL", "LDLCALC" Lab Results  Component Value Date   TSH 1.81 12/06/2017   TSH 1.422 05/29/2009    Therapeutic Level Labs: No results found for: "LITHIUM" No results found for: "VALPROATE" No results found for: "CBMZ"  Current Medications: Current Outpatient Medications  Medication Sig Dispense Refill   Buprenorphine HCl-Naloxone HCl 8-2 MG FILM Place 3 tablets under the tongue daily.     buPROPion (WELLBUTRIN XL) 300 MG 24 hr  tablet Take 1 tablet (300 mg total) by mouth daily. 30 tablet 3   cetirizine (ZYRTEC ALLERGY) 10 MG tablet Take 1 tablet (10 mg total) by mouth daily. 30 tablet 0   clonazePAM (KLONOPIN) 0.5 MG tablet Take 1 tablet (0.5 mg total) by mouth daily as needed for anxiety. 15 tabs per month 15 tablet 1   fluticasone (FLONASE) 50 MCG/ACT nasal spray Place 1 spray into both nostrils daily for 14 days. 16 g 0   ibuprofen (ADVIL) 800 MG tablet Take 1 tablet (800 mg total) by mouth 3 (three) times daily. 21 tablet 0   levonorgestrel (MIRENA) 20 MCG/24HR IUD 1 each by Intrauterine route once.     lisinopril (ZESTRIL) 40 MG tablet Take 40 mg by mouth daily.     ondansetron (ZOFRAN-ODT) 4 MG disintegrating tablet Take 1 tablet (4 mg total) by mouth every 8 (eight) hours as needed for nausea or vomiting. 20 tablet 0   sertraline (ZOLOFT) 100 MG tablet Take 1 tablet (100 mg total) by mouth daily. 30 tablet 5   No current facility-administered medications for this visit.     Musculoskeletal: Strength & Muscle Tone: within normal limits Gait & Station: normal Patient leans: N/A  Psychiatric Specialty Exam: Review of Systems  There were no vitals taken for this visit.There is no height or weight on file to calculate BMI.  General Appearance: {Appearance:22683}  Eye Contact:  {BHH EYE CONTACT:22684}  Speech:  Clear and Coherent  Volume:  Normal  Mood:  {BHH MOOD:22306}  Affect:  {Affect (PAA):22687}  Thought Process:  Coherent  Orientation:  Full (Time, Place, and Person)  Thought Content: Logical   Suicidal Thoughts:  {ST/HT (PAA):22692}  Homicidal Thoughts:  {ST/HT (PAA):22692}  Memory:  Immediate;   Good  Judgement:  {Judgement (PAA):22694}  Insight:  {Insight (PAA):22695}  Psychomotor Activity:  Normal  Concentration:  Concentration: Good and Attention Span: Good  Recall:  Good  Fund of Knowledge: Good  Language: Good  Akathisia:  No  Handed:  Right  AIMS (if indicated): not done  Assets:   Communication Skills Desire for Improvement  ADL's:  Intact  Cognition: WNL  Sleep:  {BHH GOOD/FAIR/POOR:22877}   Screenings: GAD-7    Flowsheet Row Office Visit from 07/17/2019 in [facility]  Total GAD-7 Score 6      PHQ2-9    Flowsheet Row Office Visit from 08/14/2021 in [facility] Video Visit from 03/24/2021 in [facility] Video Visit from 09/25/2020 in [facility] Video Visit from 06/28/2020 in [facility] Video Visit from 04/01/2020 in [facility]  PHQ-2 Total Score 2 2 2 2 2   PHQ-9 Total Score 11 4 6 3 10       Flowsheet Row Office Visit from 08/14/2021 in [facility] ED from 07/30/2021 in [facility] at [facility] ED from 11/09/2020 in [facility] at [place]  C-SSRS RISK CATEGORY No Risk No Risk No Risk        Assessment and Plan:  .LUCINDA SPELLS is a 42 y.o. 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    1. GAD (generalized anxiety disorder) 2. MDD (major depressive disorder), recurrent episode, mild (HCC) Acute stressors: financial strain (searching for a job), her father is missing (communicating with [place] police) Chronic stressors: loneliness, loss of her supportive mother in April 2021, occasional conflict with her oldest daughter, her father who has been in and out of jail She reports mild depressive symptoms and anxiety since the last visit.  She discontinued lamotrigine due to adverse reaction of drowsiness.  Due to her reported tremors, she is not  considered a suitable candidate to try antipsychotics.  She is willing to stay on the current medication regimen until the next in person visit.  Will continue sertraline and bupropion to target depression.  Will continue clonazepam as needed for anxiety. It is noted that although the combination of Suboxone and clonazepam is not preferable due to the potential risk of respiratory suppression and dependence, the patient reports significant improvement in functioning since being on clonazepam. Therefore, it is decided to continue judiciously prescribing this medication.   3. Insomnia, unspecified type She has occasional insomnia.  Referral was made for evaluation of sleep apnea due to daytime fatigue, middle insomnia and snoring.    3. Opioid dependence in remission [facility]) She is on Suboxone, prescribed by PCP.  Will continue motivational interview.    4. Cocaine abuse (HCC) # Marijuana use She continues to use marijuana.  She has abstained from using cocaine. Will continue motivational interview.    Plan Continue sertraline 100 mg daily (worsening in fatigue at higher dose) Continue bupropion 300 mg daily (had seizure when she was a child) Continue clonazepam 0.5 mg daily as needed, 15 tabs per month Next appointment-  4/29 at 11:30, IP [email or web address]  - gabapentin 300 mg at night (limited to take at night only as it causes drowsiness) - Discussed attendance policy - she is on clonidine, prescribed by PCP   Past trials of medication: bupropion, Buspar, hydroxyzine, lamotrigine (dizziness), quetiapine/drowsiness, diazepam   The patient demonstrates the following risk factors for suicide: Chronic risk factors for suicide include: psychiatric disorder of depression, anxiety and substance use disorder. Acute risk factors for suicide include: family or marital conflict and unemployment. Protective factors for this patient include: responsibility to others (children, family), coping skills  and hope for the future. Considering these factors, the overall suicide risk at this point appears to be low. Patient is appropriate for outpatient follow up.  Collaboration of Care: Collaboration of Care: {BH OP Collaboration of Care:21014065}  Patient/Guardian was advised Release of Information must be obtained prior to any record release in order to collaborate their care with an outside provider. Patient/Guardian was advised if they have not already done so  to contact the registration department to sign all necessary forms in order for [place] to release information regarding their care.   Consent: Patient/Guardian gives verbal consent for treatment and assignment of benefits for services provided during this visit. Patient/Guardian expressed understanding and agreed to proceed.    Neysa Hotter, MD 06/05/2022, 5:09 PM

## 2022-06-08 ENCOUNTER — Ambulatory Visit: Payer: Medicaid Other | Admitting: Psychiatry

## 2022-06-08 ENCOUNTER — Other Ambulatory Visit: Payer: Self-pay | Admitting: Psychiatry

## 2022-06-08 ENCOUNTER — Telehealth: Payer: Self-pay | Admitting: Psychiatry

## 2022-06-08 DIAGNOSIS — F411 Generalized anxiety disorder: Secondary | ICD-10-CM

## 2022-06-08 MED ORDER — BUPROPION HCL ER (XL) 300 MG PO TB24: 300.0000 mg | ORAL_TABLET | Freq: Every day | ORAL | 3 refills | Status: DC

## 2022-06-08 MED ORDER — CLONAZEPAM 0.5 MG PO TABS: 0.5000 mg | ORAL_TABLET | Freq: Every day | ORAL | 1 refills | Status: DC | PRN

## 2022-06-08 NOTE — Telephone Encounter (Signed)
Hey Dr. Vanetta Shawl, this patient cancelled for today, she is not feeling good, I put her on a waiting list, her appointment is rescheduled for July, she wants to make sure she gets her refill before that July appointment-please advise

## 2022-06-08 NOTE — Telephone Encounter (Signed)
Ordered

## 2022-06-09 ENCOUNTER — Ambulatory Visit: Payer: Medicaid Other | Admitting: Women's Health

## 2022-06-09 ENCOUNTER — Encounter: Payer: Self-pay | Admitting: Women's Health

## 2022-06-09 VITALS — BP 112/78 | HR 87 | Ht 61.0 in | Wt 172.0 lb

## 2022-06-09 DIAGNOSIS — Z30430 Encounter for insertion of intrauterine contraceptive device: Secondary | ICD-10-CM

## 2022-06-09 DIAGNOSIS — Z3202 Encounter for pregnancy test, result negative: Secondary | ICD-10-CM | POA: Diagnosis not present

## 2022-06-09 DIAGNOSIS — Z30433 Encounter for removal and reinsertion of intrauterine contraceptive device: Secondary | ICD-10-CM

## 2022-06-09 LAB — POCT URINE PREGNANCY: Preg Test, Ur: NEGATIVE

## 2022-06-09 MED ORDER — LEVONORGESTREL 20 MCG/DAY IU IUD
1.0000 | INTRAUTERINE_SYSTEM | Freq: Once | INTRAUTERINE | Status: AC
  Administered 2022-06-09: 1 via INTRAUTERINE

## 2022-06-09 NOTE — Patient Instructions (Signed)
Nothing in vagina for 3 days (no sex, douching, tampons, etc...) Check your strings once a month to make sure you can feel them, if you are not able to please let us know If you develop a fever of 100.4 or more in the next few weeks, or if you develop severe abdominal pain, please let us know Use a backup method of birth control, such as condoms, for 2 weeks  

## 2022-06-09 NOTE — Progress Notes (Signed)
IUD REMOVAL & RE-INSERTION Patient name: Angelica Lopez MRN [id number]  Date of birth: 08-Apr-1980 Subjective Findings:   @Angelica  Judie Petit Lopez is a 42 y.o.  Caucasian female being seen today for removal of a Mirena  IUD and insertion of a Mirena  IUD. Her IUD was placed 06/14/2014.  No LMP recorded. (Menstrual status: IUD). Last sexual intercourse was : not sexually active Last pap 06/13/21. Results were: NILM w/ HRHPV negative  The risks and benefits of the method and placement have been thouroughly reviewed with the patient and all questions were answered.  Specifically the patient is aware of failure rate of 02/998, expulsion of the IUD and of possible perforation.  The patient is aware of irregular bleeding due to the method and understands the incidence of irregular bleeding diminishes with time.  Signed copy of informed consent in chart.      08/14/2021    9:32 AM 03/24/2021    1:13 PM 09/25/2020   11:13 AM 06/28/2020   11:17 AM 04/01/2020    1:57 PM  Depression screen PHQ 2/9  Decreased Interest       Down, Depressed, Hopeless       PHQ - 2 Score       Altered sleeping       Tired, decreased energy       Change in appetite       Feeling bad or failure about yourself        Trouble concentrating       Moving slowly or fidgety/restless       Suicidal thoughts       PHQ-9 Score       Difficult doing work/chores          Information is confidential and restricted. Go to Review Flowsheets to unlock data.        07/17/2019    1:43 PM  GAD 7 : Generalized Anxiety Score  Nervous, Anxious, on Edge 1  Control/stop worrying 1  Worry too much - different things 1  Trouble relaxing 1  Restless 1  Easily annoyed or irritable 1  Afraid - awful might happen 0  Total GAD 7 Score 6  Anxiety Difficulty Not difficult at all     Pertinent History Reviewed:   Reviewed past medical,surgical, social, obstetrical and family history.  Reviewed problem list, medications and allergies. Objective  Findings & Procedure:    Vitals:   06/09/22 0915  BP: 112/78  Pulse: 87  Weight: 172 lb (78 kg)  Height: 5\' 1"  (1.549 m)  Body mass index is 32.5 kg/m.  Results for orders placed or performed in visit on 06/09/22 (from the past 24 hour(s))  POCT urine pregnancy   Collection Time: 06/09/22  9:34 AM  Result Value Ref Range   Preg Test, Ur Negative Negative     Time out was performed.  A graves speculum was placed in the vagina.  The cervix was visualized, prepped using Betadine. The strings were visible. They were grasped and the Mirena IUD was easily removed. The cervix was then grasped with a single-tooth tenaculum. The uterus was found to be neutral and it sounded to 7 cm.  Mirena  IUD placed per manufacturer's recommendations without complications. The strings were trimmed to approximately 3 cm.  The patient tolerated the procedure well.   Informal transvaginal sonogram was performed and the proper placement of the IUD was verified.   Chaperone: Field seismologist & Plan:   1) Mirena  IUD removal & Mirena insertion The patient was given post procedure instructions, including signs and symptoms of infection and to check for the strings after each menses or each month, and refraining from intercourse or anything in the vagina for 3 days. She was given a care card with date IUD placed, and date IUD to be removed. She is scheduled for a f/u appointment in 4 weeks.  Orders Placed This Encounter  Procedures   POCT urine pregnancy    Follow-up: No follow-ups on file.  Cheral Marker CNM, [facility] 06/09/2022 10:01 AM

## 2022-06-09 NOTE — Addendum Note (Signed)
Addended by: Federico Flake A on: 06/09/2022 10:33 AM   Modules accepted: Orders

## 2022-07-27 ENCOUNTER — Other Ambulatory Visit: Payer: Self-pay | Admitting: Psychiatry

## 2022-07-27 ENCOUNTER — Telehealth: Payer: Self-pay | Admitting: Psychiatry

## 2022-07-27 DIAGNOSIS — F411 Generalized anxiety disorder: Secondary | ICD-10-CM

## 2022-07-27 NOTE — Telephone Encounter (Signed)
Disregard this message. Patient found she had a refill on medication.
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
## 2022-08-18 ENCOUNTER — Ambulatory Visit (INDEPENDENT_AMBULATORY_CARE_PROVIDER_SITE_OTHER): Payer: Medicaid Other | Admitting: Psychiatry

## 2022-08-18 ENCOUNTER — Encounter: Payer: Self-pay | Admitting: Psychiatry

## 2022-08-18 VITALS — BP 124/82 | HR 101 | Temp 97.9°F | Ht 61.0 in | Wt 173.8 lb

## 2022-08-18 DIAGNOSIS — G4700 Insomnia, unspecified: Secondary | ICD-10-CM | POA: Diagnosis not present

## 2022-08-18 DIAGNOSIS — F411 Generalized anxiety disorder: Secondary | ICD-10-CM | POA: Diagnosis not present

## 2022-08-18 DIAGNOSIS — F330 Major depressive disorder, recurrent, mild: Secondary | ICD-10-CM

## 2022-08-18 MED ORDER — CLONAZEPAM 0.5 MG PO TABS
0.5000 mg | ORAL_TABLET | Freq: Every day | ORAL | 1 refills | Status: DC | PRN
Start: 2022-08-28 — End: 2022-10-16

## 2022-08-18 MED ORDER — FLUOXETINE HCL 10 MG PO CAPS: 10.0000 mg | ORAL_CAPSULE | Freq: Every day | ORAL | 0 refills | Status: DC

## 2022-08-18 MED ORDER — FLUOXETINE HCL 20 MG PO CAPS: 20.0000 mg | ORAL_CAPSULE | Freq: Every day | ORAL | 0 refills | Status: DC

## 2022-08-18 NOTE — Patient Instructions (Addendum)
Start fluoxetine 10 mg daily for one week, then 20 mg daily  Decrease sertraline 50 mg daily for one week, then discontinue  Continue bupropion 300 mg daily  Continue clonazepam 0.5 mg daily as needed, 15 tabs per month Next appointment-  8/27 at 11 am

## 2022-08-24 NOTE — Telephone Encounter (Signed)
Contacted pharmacist.  Normally patients are allowed to pick up the prescription 2 days early and pharmacist verified that patient will be able to pick up the prescription tomorrow-for clonazepam.   Patient also has a fluoxetine 20 mg prescription which was sent out by Dr. Vanetta Shawl.  I have sent a MyChart message to this patient regarding the same.

## 2022-10-06 ENCOUNTER — Ambulatory Visit: Payer: Medicaid Other | Admitting: Psychiatry

## 2022-10-16 ENCOUNTER — Other Ambulatory Visit: Payer: Self-pay | Admitting: Psychiatry

## 2022-10-16 DIAGNOSIS — F411 Generalized anxiety disorder: Secondary | ICD-10-CM

## 2022-11-12 ENCOUNTER — Telehealth: Payer: Self-pay

## 2022-11-12 ENCOUNTER — Other Ambulatory Visit: Payer: Self-pay | Admitting: Psychiatry

## 2022-11-12 MED ORDER — BUPROPION HCL ER (XL) 300 MG PO TB24: 300.0000 mg | ORAL_TABLET | Freq: Every day | ORAL | 5 refills | Status: DC

## 2022-11-12 MED ORDER — FLUOXETINE HCL 20 MG PO CAPS: 20.0000 mg | ORAL_CAPSULE | Freq: Every day | ORAL | 0 refills | Status: DC

## 2022-11-12 NOTE — Telephone Encounter (Signed)
As documented previously, clonazepam was filled much sooner than expected; the new prescription is supposed to last until 11/24/22. I will not be able to refill the medication. The instructions were to take it every other day, with a total of 15 tabs per month, which has not changed for a while.

## 2022-11-12 NOTE — Telephone Encounter (Signed)
Ordered bupropion and fluoxetine; sertraline was discontinued. Clonazepam was filled much sooner than expected-new prescription is supposed to last until 11/24/22, and I do not authorize any refills. Please advise them to fill the medications as indicated in the prescription.

## 2022-11-12 NOTE — Telephone Encounter (Signed)
called pharmacy they discontinued the sertraline and they received the bupropion and fluoxetine but they asked about the clonazepam. they state that patient pick up rx on 9-6 and has no refills

## 2022-11-12 NOTE — Telephone Encounter (Signed)
received fax requesting a refill on the clonazepam, wellbutrin, proac, sertraline. pt was last seen on 08-18-22 next appt 10-10

## 2022-11-12 NOTE — Telephone Encounter (Signed)
pt was notified that the bupropion and the fluoxetine was sent to the pharmacy and then she asked about the clonazepam. she said tha she last filled 9-6 and according to pharmacy they have no rx on file. i told her that i had already confirmed with pharmacy and that I sent dr. Vanetta Shawl a message requesting a rx for the clonazepam

## 2022-11-13 ENCOUNTER — Other Ambulatory Visit: Payer: Self-pay | Admitting: Psychiatry

## 2022-11-13 DIAGNOSIS — F411 Generalized anxiety disorder: Secondary | ICD-10-CM

## 2022-11-14 NOTE — Progress Notes (Unsigned)
This encounter was created in error - please disregard.

## 2022-11-16 ENCOUNTER — Other Ambulatory Visit: Payer: Self-pay | Admitting: Psychiatry

## 2022-11-16 MED ORDER — CLONAZEPAM 0.5 MG PO TABS: 0.5000 mg | ORAL_TABLET | ORAL | 0 refills | Status: DC | PRN

## 2022-11-17 ENCOUNTER — Ambulatory Visit: Payer: Medicaid Other | Admitting: Psychiatry

## 2022-11-19 ENCOUNTER — Encounter: Payer: Medicaid Other | Admitting: Psychiatry

## 2022-11-19 ENCOUNTER — Telehealth: Payer: Medicaid Other | Admitting: Psychiatry

## 2022-11-19 ENCOUNTER — Encounter: Payer: Self-pay | Admitting: Psychiatry

## 2022-11-19 DIAGNOSIS — G4700 Insomnia, unspecified: Secondary | ICD-10-CM

## 2022-11-19 DIAGNOSIS — F1320 Sedative, hypnotic or anxiolytic dependence, uncomplicated: Secondary | ICD-10-CM

## 2022-11-19 DIAGNOSIS — F411 Generalized anxiety disorder: Secondary | ICD-10-CM

## 2022-11-19 DIAGNOSIS — F330 Major depressive disorder, recurrent, mild: Secondary | ICD-10-CM | POA: Diagnosis not present

## 2022-11-19 MED ORDER — CLONAZEPAM 0.5 MG PO TABS
0.5000 mg | ORAL_TABLET | ORAL | 2 refills | Status: DC | PRN
Start: 2022-11-19 — End: 2023-05-07

## 2022-11-19 NOTE — Patient Instructions (Signed)
Continue sertraline 100 mg daily  Continue bupropion 300 mg daily  Continue clonazepam 0.5 mg daily as needed, 15 tabs per month (ordered total of 3 months) Please reach out your primary care provider for continuation of care
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
## 2022-12-05 LAB — GLUCOSE, POCT (MANUAL RESULT ENTRY): POC Glucose: 128 mg/dL — AB (ref 70–99)

## 2022-12-08 NOTE — Congregational Nurse Program (Unsigned)
Health screening event at [place] [employer] fall [place]. Written consent signed to obtain blood pressure and finger stick glucose check by client. 119/81 blood pressure,, heart rate 89 and finger stick glucose non fasting was 128. Obtained by CCNPRenne Crigler RN

## 2023-05-07 ENCOUNTER — Emergency Department (HOSPITAL_COMMUNITY)

## 2023-05-07 ENCOUNTER — Encounter (HOSPITAL_COMMUNITY): Payer: Self-pay | Admitting: Emergency Medicine

## 2023-05-07 ENCOUNTER — Other Ambulatory Visit: Payer: Self-pay

## 2023-05-07 ENCOUNTER — Observation Stay (HOSPITAL_COMMUNITY)
Admission: EM | Admit: 2023-05-07 | Discharge: 2023-05-08 | Disposition: A | Discharge status: Home / Self Care | Attending: Family Medicine | Admitting: Family Medicine

## 2023-05-07 DIAGNOSIS — N390 Urinary tract infection, site not specified: Secondary | ICD-10-CM

## 2023-05-07 DIAGNOSIS — F17210 Nicotine dependence, cigarettes, uncomplicated: Secondary | ICD-10-CM | POA: Diagnosis not present

## 2023-05-07 DIAGNOSIS — D696 Thrombocytopenia, unspecified: Secondary | ICD-10-CM | POA: Insufficient documentation

## 2023-05-07 DIAGNOSIS — Z79899 Other long term (current) drug therapy: Secondary | ICD-10-CM | POA: Insufficient documentation

## 2023-05-07 DIAGNOSIS — E876 Hypokalemia: Secondary | ICD-10-CM | POA: Diagnosis not present

## 2023-05-07 DIAGNOSIS — Z6833 Body mass index (BMI) 33.0-33.9, adult: Secondary | ICD-10-CM | POA: Insufficient documentation

## 2023-05-07 DIAGNOSIS — G8929 Other chronic pain: Secondary | ICD-10-CM | POA: Insufficient documentation

## 2023-05-07 DIAGNOSIS — N200 Calculus of kidney: Secondary | ICD-10-CM | POA: Diagnosis not present

## 2023-05-07 DIAGNOSIS — N179 Acute kidney failure, unspecified: Secondary | ICD-10-CM | POA: Diagnosis not present

## 2023-05-07 DIAGNOSIS — E669 Obesity, unspecified: Secondary | ICD-10-CM | POA: Insufficient documentation

## 2023-05-07 DIAGNOSIS — R109 Unspecified abdominal pain: Secondary | ICD-10-CM | POA: Diagnosis present

## 2023-05-07 DIAGNOSIS — F329 Major depressive disorder, single episode, unspecified: Secondary | ICD-10-CM | POA: Diagnosis not present

## 2023-05-07 DIAGNOSIS — I10 Essential (primary) hypertension: Secondary | ICD-10-CM | POA: Diagnosis not present

## 2023-05-07 DIAGNOSIS — D649 Anemia, unspecified: Secondary | ICD-10-CM | POA: Insufficient documentation

## 2023-05-07 DIAGNOSIS — E860 Dehydration: Secondary | ICD-10-CM | POA: Diagnosis not present

## 2023-05-07 LAB — URINALYSIS, W/ REFLEX TO CULTURE (INFECTION SUSPECTED)
Bilirubin Urine: NEGATIVE
Glucose, UA: NEGATIVE mg/dL
Ketones, ur: NEGATIVE mg/dL
Nitrite: NEGATIVE
Protein, ur: 30 mg/dL — AB
RBC / HPF: >50 RBC/hpf (ref 0–5)
Specific Gravity, Urine: 1.017 (ref 1.005–1.030)
WBC, UA: >50 WBC/hpf (ref 0–5)
pH: 7 (ref 5.0–8.0)

## 2023-05-07 LAB — CBC WITH DIFFERENTIAL/PLATELET
Abs Immature Granulocytes: 0.02 10*3/uL (ref 0.00–0.07)
Basophils Absolute: 0 10*3/uL (ref 0.0–0.1)
Basophils Relative: 0 %
Eosinophils Absolute: 0.1 10*3/uL (ref 0.0–0.5)
Eosinophils Relative: 1 %
HCT: 43.4 % (ref 36.0–46.0)
Hemoglobin: 13.7 g/dL (ref 12.0–15.0)
Immature Granulocytes: 0 %
Lymphocytes Relative: 13 %
Lymphs Abs: 0.9 10*3/uL (ref 0.7–4.0)
MCH: 27.6 pg (ref 26.0–34.0)
MCHC: 31.6 g/dL (ref 30.0–36.0)
MCV: 87.3 fL (ref 80.0–100.0)
Monocytes Absolute: 0.3 10*3/uL (ref 0.1–1.0)
Monocytes Relative: 4 %
Neutro Abs: 5.7 10*3/uL (ref 1.7–7.7)
Neutrophils Relative %: 82 %
Platelets: 180 10*3/uL (ref 150–400)
RBC: 4.97 MIL/uL (ref 3.87–5.11)
RDW: 13.2 % (ref 11.5–15.5)
WBC: 6.9 10*3/uL (ref 4.0–10.5)
nRBC: 0 % (ref 0.0–0.2)

## 2023-05-07 LAB — COMPREHENSIVE METABOLIC PANEL WITH GFR
ALT: 16 U/L (ref 0–44)
AST: 30 U/L (ref 15–41)
Albumin: 4.2 g/dL (ref 3.5–5.0)
Alkaline Phosphatase: 61 U/L (ref 38–126)
Anion gap: 10 (ref 5–15)
BUN: 21 mg/dL — ABNORMAL HIGH (ref 6–20)
CO2: 24 mmol/L (ref 22–32)
Calcium: 9.4 mg/dL (ref 8.9–10.3)
Chloride: 102 mmol/L (ref 98–111)
Creatinine, Ser: 1.61 mg/dL — ABNORMAL HIGH (ref 0.44–1.00)
GFR, Estimated: 41 mL/min — ABNORMAL LOW (ref >60)
Glucose, Bld: 125 mg/dL — ABNORMAL HIGH (ref 70–99)
Potassium: 4.5 mmol/L (ref 3.5–5.1)
Sodium: 136 mmol/L (ref 135–145)
Total Bilirubin: 0.8 mg/dL (ref 0.0–1.2)
Total Protein: 7.8 g/dL (ref 6.5–8.1)

## 2023-05-07 LAB — PREGNANCY, URINE: Preg Test, Ur: NEGATIVE

## 2023-05-07 LAB — LIPASE, BLOOD: Lipase: 25 U/L (ref 11–51)

## 2023-05-07 MED ORDER — ONDANSETRON HCL 4 MG/2ML IJ SOLN
4.0000 mg | Freq: Once | INTRAMUSCULAR | Status: AC
  Administered 2023-05-07: 4 mg via INTRAVENOUS
  Filled 2023-05-07: qty 2

## 2023-05-07 MED ORDER — BUPRENORPHINE HCL-NALOXONE HCL 8-2 MG SL SUBL
1.0000 | SUBLINGUAL_TABLET | Freq: Once | SUBLINGUAL | Status: AC
  Administered 2023-05-07: 1 via SUBLINGUAL
  Filled 2023-05-07: qty 1

## 2023-05-07 MED ORDER — KETOROLAC TROMETHAMINE 15 MG/ML IJ SOLN
15.0000 mg | Freq: Four times a day (QID) | INTRAMUSCULAR | Status: DC | PRN
  Administered 2023-05-07: 15 mg via INTRAVENOUS
  Filled 2023-05-07: qty 1

## 2023-05-07 MED ORDER — LACTATED RINGERS IV BOLUS
1000.0000 mL | Freq: Once | INTRAVENOUS | Status: AC
  Administered 2023-05-07: 1000 mL via INTRAVENOUS

## 2023-05-07 MED ORDER — CLONAZEPAM 0.5 MG PO TABS
0.5000 mg | ORAL_TABLET | Freq: Two times a day (BID) | ORAL | Status: DC | PRN
  Administered 2023-05-07: 0.5 mg via ORAL
  Filled 2023-05-07: qty 1

## 2023-05-07 MED ORDER — SERTRALINE HCL 50 MG PO TABS
100.0000 mg | ORAL_TABLET | Freq: Every day | ORAL | Status: DC
  Administered 2023-05-08: 100 mg via ORAL
  Filled 2023-05-07: qty 2

## 2023-05-07 MED ORDER — ACETAMINOPHEN 650 MG RE SUPP: 650.0000 mg | Freq: Four times a day (QID) | RECTAL | Status: DC | PRN

## 2023-05-07 MED ORDER — SODIUM CHLORIDE 0.9 % IV SOLN
1.0000 g | Freq: Once | INTRAVENOUS | Status: AC
  Administered 2023-05-07: 1 g via INTRAVENOUS
  Filled 2023-05-07: qty 10

## 2023-05-07 MED ORDER — LISINOPRIL 10 MG PO TABS: 20.0000 mg | ORAL_TABLET | Freq: Every day | ORAL | Status: DC

## 2023-05-07 MED ORDER — HEPARIN SODIUM (PORCINE) 5000 UNIT/ML IJ SOLN: 5000.0000 [IU] | Freq: Three times a day (TID) | INTRAMUSCULAR | Status: DC

## 2023-05-07 MED ORDER — PANTOPRAZOLE SODIUM 40 MG IV SOLR
40.0000 mg | INTRAVENOUS | Status: DC
  Administered 2023-05-07: 40 mg via INTRAVENOUS
  Filled 2023-05-07: qty 10

## 2023-05-07 MED ORDER — BUPROPION HCL ER (XL) 150 MG PO TB24
300.0000 mg | ORAL_TABLET | Freq: Every day | ORAL | Status: DC
  Administered 2023-05-08: 300 mg via ORAL
  Filled 2023-05-07: qty 2

## 2023-05-07 MED ORDER — TRAZODONE HCL 50 MG PO TABS
100.0000 mg | ORAL_TABLET | Freq: Every day | ORAL | Status: DC
  Administered 2023-05-07: 100 mg via ORAL
  Filled 2023-05-07: qty 2

## 2023-05-07 MED ORDER — SODIUM CHLORIDE 0.9 % IV SOLN: 1.0000 g | INTRAVENOUS | Status: DC

## 2023-05-07 MED ORDER — ACETAMINOPHEN 325 MG PO TABS
650.0000 mg | ORAL_TABLET | Freq: Four times a day (QID) | ORAL | Status: DC | PRN
  Administered 2023-05-08: 650 mg via ORAL
  Filled 2023-05-07: qty 2

## 2023-05-07 MED ORDER — NICOTINE 21 MG/24HR TD PT24
21.0000 mg | MEDICATED_PATCH | Freq: Every day | TRANSDERMAL | Status: DC
  Administered 2023-05-07 – 2023-05-08 (×2): 21 mg via TRANSDERMAL
  Filled 2023-05-07 (×2): qty 1

## 2023-05-07 MED ORDER — HYDRALAZINE HCL 20 MG/ML IJ SOLN: 5.0000 mg | INTRAMUSCULAR | Status: DC | PRN

## 2023-05-07 MED ORDER — LACTATED RINGERS IV SOLN: INTRAVENOUS | Status: DC

## 2023-05-07 MED ORDER — BUPRENORPHINE HCL-NALOXONE HCL 8-2 MG SL SUBL
2.0000 | SUBLINGUAL_TABLET | Freq: Every day | SUBLINGUAL | Status: DC
  Administered 2023-05-08: 2 via SUBLINGUAL
  Filled 2023-05-07: qty 2

## 2023-05-07 MED ORDER — KETOROLAC TROMETHAMINE 15 MG/ML IJ SOLN
15.0000 mg | Freq: Once | INTRAMUSCULAR | Status: AC
  Administered 2023-05-07: 15 mg via INTRAVENOUS
  Filled 2023-05-07: qty 1

## 2023-05-07 MED ORDER — SODIUM CHLORIDE 0.9 % IV SOLN: 2.0000 g | INTRAVENOUS | Status: DC

## 2023-05-07 NOTE — ED Triage Notes (Signed)
 Pt bib pov w/ c/o severe flank/abdominal pain. Pt reports she ate [employer] last week and got abdominal pain because of the greasiness. She decided to take a laxative to speed up the process of getting the food out of her system. Pt reports she woke up this morning with severe flank pain all the way around her abdomen. Pt reports she does not drink any water. She has been having some urinary frequency/urgency. Pt also reports she also has chronic back problems but her this is different than her usual chronic pain. Pt is unable to take narcotic due to taking suboxone.

## 2023-05-07 NOTE — H&P (Signed)
 [facility] H&P   Patient Demographics:    Angelica Lopez, is a 43 y.o. female  MRN: [id number]   DOB - 14-Jan-1981  Admit Date - 05/07/2023  Outpatient Primary MD for the patient is Corrington, Kip A, MD  Referring MD/NP/PA: PA Celeste Patient coming from: Home  Chief Complaint  Patient presents with   Flank Pain      HPI:    Angelica Lopez  is a 43 y.o. female, with past medical history of anxiety, depression, hypertension, back pain. -Patient presents to ED secondary to complaints of flank pain, patient report she had upset stomach after eating at [employer] about a week ago, she has not been feeling well, she took some laxatives thinking it would help, this morning she woke up with severe pain in her back, midline radiating to both sides, and pain radiating to right leg as well, denies any bladder incontinence, paresthesia or stool incontinence, fever, no chills at home, no dysuria, but reports that urine and increased frequency, he denies any history of kidney stones. -In ED her workup significant for elevated creatinine at 1.6, white blood cell count within normal limit, urine pregnancy test is negative, UA significant for hematuria and pyuria, CT abdomen pelvis significant for right kidney stone at UPJ junction, with hydronephrosis and pyelonephritis, ED discussed with urology who recommended admission, keeping n.p.o. after midnight, [facility] hospitalist consulted to admit   Review of systems:     A full 10 point Review of Systems was done, except as stated above, all other Review of Systems were negative.   With Past History of the following :    Past Medical History:  Diagnosis Date   Anxiety    Back pain    Depression    Hypertension    Kyphosis    Scheuermann's disease       Past Surgical History:  Procedure Laterality Date   CESAREAN SECTION     TONSILLECTOMY         Social History:     Social History   Tobacco Use   Smoking status: Every Day    Current packs/day: 2.00    Average packs/day: 2.0 packs/day for 15.0 years (30.0 ttl pk-yrs)    Types: Cigarettes   Smokeless tobacco: Never  Substance Use Topics   Alcohol use: No       Family History :     Family History  Problem Relation Age of Onset   Heart disease Maternal Grandfather        has had 3 open heart surgeries   Hypertension Father    Diabetes Father    Anxiety disorder Father    Depression Father    Anxiety disorder Mother    Depression Mother    Febrile seizures Daughter    Febrile seizures Daughter    Febrile seizures Daughter      Home Medications:   Prior  to Admission medications   Medication Sig Start Date End Date Taking? Authorizing Provider  Buprenorphine HCl-Naloxone HCl 8-2 MG FILM Place 3 Film under the tongue daily.   Yes [provider]  buPROPion (WELLBUTRIN XL) 300 MG 24 hr tablet Take 300 mg by mouth daily.   Yes [provider]  cetirizine (ZYRTEC ALLERGY) 10 MG tablet Take 1 tablet (10 mg total) by mouth daily. 11/03/19  Yes Avegno, Zachery Dakins, FNP  clonazePAM (KLONOPIN) 0.5 MG tablet Take 1 tablet (0.5 mg total) by mouth every other day as needed for up to 4 days for anxiety. Patient taking differently: Take 0.5 mg by mouth 2 (two) times daily as needed for anxiety. 11/16/22 05/07/23 Yes Neysa Hotter, MD  hydrOXYzine (ATARAX) 25 MG tablet Take 25 mg by mouth daily as needed for anxiety or itching.   Yes [provider]  levonorgestrel (MIRENA) 20 MCG/24HR IUD 1 each by Intrauterine route once.   Yes [provider]  lisinopril (ZESTRIL) 20 MG tablet Take 20 mg by mouth daily. 12/13/11  Yes [provider]  sertraline (ZOLOFT) 100 MG tablet Take 1 tablet (100 mg total) by mouth daily. Patient taking differently: Take 200 mg by mouth daily. 03/23/22 05/07/23 Yes Neysa Hotter, MD  traZODone (DESYREL) 100  MG tablet Take 100 mg by mouth at bedtime. 05/03/23  Yes [provider]     Allergies:     Allergies  Allergen Reactions   Codeine Anaphylaxis    Tongue swells     Physical Exam:   Vitals  Blood pressure (!) 146/89, pulse 70, temperature 98.7 F (37.1 C), temperature source Oral, resp. rate 18, height 5\' 1"  (1.549 m), weight 79.4 kg, SpO2 96%.   1. General Developed female, laying in bed, no apparent distress  2. Normal affect and insight, Not Suicidal or Homicidal, Awake Alert, Oriented X 3.  3. No F.N deficits, ALL C.Nerves Intact, Strength 5/5 all 4 extremities, Sensation intact all 4 extremities, Plantars down going.  4. Ears and Eyes appear Normal, Conjunctivae clear, PERRLA. Moist Oral Mucosa.  5. Supple Neck, No JVD, No cervical lymphadenopathy appriciated, No Carotid Bruits.  6. Symmetrical Chest wall movement, Good air movement bilaterally, CTAB.  7. RRR, No Gallops, Rubs or Murmurs, No Parasternal Heave.  8. Positive Bowel Sounds, Abdomen Soft, left lateral abdomen tenderness, No organomegaly appriciated,No rebound -guarding or rigidity.  9.  No Cyanosis, Normal Skin Turgor, No Skin Rash or Bruise.  10. Good muscle tone,  joints appear normal , no effusions, Normal ROM.    Data Review:    CBC Recent Labs  Lab 05/07/23 1357  WBC 6.9  HGB 13.7  HCT 43.4  PLT 180  MCV 87.3  MCH 27.6  MCHC 31.6  RDW 13.2  LYMPHSABS 0.9  MONOABS 0.3  EOSABS 0.1  BASOSABS 0.0   ------------------------------------------------------------------------------------------------------------------  Chemistries  Recent Labs  Lab 05/07/23 1357  NA 136  K 4.5  CL 102  CO2 24  GLUCOSE 125*  BUN 21*  CREATININE 1.61*  CALCIUM 9.4  AST 30  ALT 16  ALKPHOS 61  BILITOT 0.8   ------------------------------------------------------------------------------------------------------------------ estimated creatinine clearance is 43.8 mL/min (A) (by C-G formula  based on SCr of 1.61 mg/dL (H)). ------------------------------------------------------------------------------------------------------------------ No results for input(s): "TSH", "T4TOTAL", "T3FREE", "THYROIDAB" in the last 72 hours.  Invalid input(s): "FREET3"  Coagulation profile No results for input(s): "INR", "PROTIME" in the last 168 hours. ------------------------------------------------------------------------------------------------------------------- No results for input(s): "DDIMER" in the last 72 hours. -------------------------------------------------------------------------------------------------------------------  Cardiac Enzymes No results for input(s): "CKMB", "TROPONINI", "MYOGLOBIN" in the last 168 hours.  Invalid input(s): "CK" ------------------------------------------------------------------------------------------------------------------ No results found for: "BNP"   ---------------------------------------------------------------------------------------------------------------  Urinalysis    Component Value Date/Time   COLORURINE YELLOW 05/07/2023 0159   APPEARANCEUR CLOUDY (A) 05/07/2023 0159   LABSPEC 1.017 05/07/2023 0159   PHURINE 7.0 05/07/2023 0159   GLUCOSEU NEGATIVE 05/07/2023 0159   HGBUR MODERATE (A) 05/07/2023 0159   BILIRUBINUR NEGATIVE 05/07/2023 0159   KETONESUR NEGATIVE 05/07/2023 0159   PROTEINUR 30 (A) 05/07/2023 0159   UROBILINOGEN 0.2 05/16/2012 1121   NITRITE NEGATIVE 05/07/2023 0159   LEUKOCYTESUR MODERATE (A) 05/07/2023 0159    ----------------------------------------------------------------------------------------------------------------   Imaging Results:    CT L-SPINE NO CHARGE Result Date: 05/07/2023 CLINICAL DATA:  Flank and abdominal pain.  Chronic low back pain. EXAM: CT LUMBAR SPINE WITHOUT CONTRAST TECHNIQUE: Multidetector CT imaging of the lumbar spine was performed without intravenous contrast administration.  Multiplanar CT image reconstructions were also generated. RADIATION DOSE REDUCTION: This exam was performed according to the departmental dose-optimization program which includes automated exposure control, adjustment of the mA and/or kV according to patient size and/or use of iterative reconstruction technique. COMPARISON:  None Available. FINDINGS: Segmentation: 5 lumbar type vertebrae. Alignment: Normal Vertebrae: No acute fracture or focal pathologic process. Paraspinal and other soft tissues: Paraspinal soft tissues unremarkable. Right hydronephrosis and perinephric stranding due to renal pelvic stone. See abdominal CT report. Disc levels: Maintained.  No disc herniation. IMPRESSION: No acute bony abnormality. Right renal pelvic stone with hydronephrosis and perinephric stranding. See abdominal CT report today. Electronically Signed   By: Charlett Nose M.D.   On: 05/07/2023 17:23   CT ABDOMEN PELVIS WO CONTRAST Result Date: 05/07/2023 CLINICAL DATA:  Abdominal pain, flank pain EXAM: CT ABDOMEN AND PELVIS WITHOUT CONTRAST TECHNIQUE: Multidetector CT imaging of the abdomen and pelvis was performed following the standard protocol without IV contrast. RADIATION DOSE REDUCTION: This exam was performed according to the departmental dose-optimization program which includes automated exposure control, adjustment of the mA and/or kV according to patient size and/or use of iterative reconstruction technique. COMPARISON:  None Available. FINDINGS: Lower chest: Ground-glass opacities in the lung bases, favor atelectasis. No effusions. Hepatobiliary: Gallbladder mildly distended. No visible stones, wall thickening or changes of cholecystitis. No biliary ductal dilatation or focal hepatic abnormality. Pancreas: No focal abnormality or ductal dilatation. Spleen: No focal abnormality.  Normal size. Adrenals/Urinary Tract: Adrenal glands are normal. There is moderate right hydronephrosis and perinephric stranding. 1.4 x 1.2  cm stone noted in the right renal pelvis at the UPJ. No ureteral stones. 3 mm nonobstructing stone in the midpole of the left kidney. Urinary bladder unremarkable. Stomach/Bowel: Stomach, large and small bowel grossly unremarkable. Vascular/Lymphatic: No evidence of aneurysm or adenopathy. Reproductive: Uterus and adnexa unremarkable. No mass. IUD in the uterus. Other: No free fluid or free air. Musculoskeletal: No acute bony abnormality. IMPRESSION: 1.4 x 1.2 cm right renal pelvic stone near the UPJ with moderate right hydronephrosis and perinephric stranding. Left nephrolithiasis.  No hydronephrosis. Bibasilar atelectasis. Electronically Signed   By: Charlett Nose M.D.   On: 05/07/2023 17:21     Assessment & Plan:    Principal Problem:   Right nephrolithiasis Active Problems:   Kidney stone   Obesity (BMI 30-39.9)    UTI Right nephrolithiasis Right pyelonephritis Infected right kidney stone -Patient presents with right renal colic, imaging significant for right renal pelvic stone near the UPJ J with moderate right hydronephrosis and perinephric  stranding -Follow on blood cultures and urine cultures. -Continue with IV Rocephin and adjust antibiotics as needed. -Will keep n.p.o. after midnight as discussed with urology to [facility], to see if any further procedure needed tomorrow versus outpatient, he will see in a.m. and determine that.  AKI -Due to hydronephrosis, continue with IV fluids and avoid nephrotoxic medications. -Hold lisinopril  Hypertension -Will hold lisinopril, pressure is soft, will hold on initiating scheduled medications, will keep on as needed hydralazine meanwhile  Anxiety, depression  - Continue with home medications.  Obesity class I -Body mass index is 33.07 kg/m.  DVT Prophylaxis Heparin AM Labs Ordered, also please review Full Orders  Family Communication: Admission, patients condition and plan of care including tests being ordered have been discussed  with the patient who indicate understanding and agree with the plan and Code Status.  Code Status full code  Likely DC to home  Consults called: Urology Dr. Ronne Binning  Admission status: Inpatient  Time spent in minutes : 70 minutes   Huey Bienenstock M.D on 05/07/2023 at 8:35 PM

## 2023-05-07 NOTE — ED Notes (Signed)
 This RN tried to give report for pt going to room 2A 13 with no answer.

## 2023-05-07 NOTE — ED Provider Notes (Signed)
 [place] EMERGENCY DEPARTMENT AT [facility] Provider Note   CSN: [id number] Arrival date & time: 05/07/23  1200     History  Chief Complaint  Patient presents with   Flank Pain    Angelica Lopez is a 43 y.o. female.  She reports PMH of Zaidi, depression, chronic low back pain, chronic Suboxone use.  Presents ER complaining of abdominal and flank pain, she had some upset stomach after eating at [employer] about a week ago.  She was not feeling well so took a laxative thinking this would potentially help.  She woke up this morning with severe pain in her back at midline radiating to both sides, somewhat worse on the left.  She is also having some pain since being in the ER radiating into the posterior aspect of the right leg.  No saddle esthesia or paresthesia, no bowel or bladder incontinence.  No fevers or chills.  No chest pain or shortness of breath.  Denies dysuria but does report frequency and dark urine.  Has history of kidney stones but does report her daughter gets kidney stones.   Flank Pain       Home Medications Prior to Admission medications   Medication Sig Start Date End Date Taking? Authorizing Provider  Buprenorphine HCl-Naloxone HCl 8-2 MG FILM Place 3 Film under the tongue daily.   Yes [provider]  buPROPion (WELLBUTRIN XL) 300 MG 24 hr tablet Take 300 mg by mouth daily.   Yes [provider]  cetirizine (ZYRTEC ALLERGY) 10 MG tablet Take 1 tablet (10 mg total) by mouth daily. 11/03/19  Yes Avegno, Zachery Dakins, FNP  clonazePAM (KLONOPIN) 0.5 MG tablet Take 1 tablet (0.5 mg total) by mouth every other day as needed for up to 4 days for anxiety. Patient taking differently: Take 0.5 mg by mouth 2 (two) times daily as needed for anxiety. 11/16/22 05/07/23 Yes Neysa Hotter, MD  hydrOXYzine (ATARAX) 25 MG tablet Take 25 mg by mouth daily as needed for anxiety or itching.   Yes [provider]  levonorgestrel (MIRENA) 20  MCG/24HR IUD 1 each by Intrauterine route once.   Yes [provider]  lisinopril (ZESTRIL) 20 MG tablet Take 20 mg by mouth daily. 12/13/11  Yes [provider]  sertraline (ZOLOFT) 100 MG tablet Take 1 tablet (100 mg total) by mouth daily. Patient taking differently: Take 200 mg by mouth daily. 03/23/22 05/07/23 Yes Neysa Hotter, MD  traZODone (DESYREL) 100 MG tablet Take 100 mg by mouth at bedtime. 05/03/23  Yes [provider]      Allergies    Codeine    Review of Systems   Review of Systems  Genitourinary:  Positive for flank pain.    Physical Exam Updated Vital Signs BP 93/63   Pulse 72   Temp 98.2 F (36.8 C) (Oral)   Resp 18   Ht 5\' 1"  (1.549 m)   Wt 79.4 kg   SpO2 96%   BMI 33.07 kg/m  Physical Exam Vitals and nursing note reviewed.  Constitutional:      General: She is not in acute distress.    Appearance: She is well-developed.     Comments: Patient appears uncomfortable, sitting up on the edge of the bed and tearful   HENT:     Head: Normocephalic and atraumatic.     Mouth/Throat:     Mouth: Mucous membranes are moist.  Eyes:     Conjunctiva/sclera: Conjunctivae normal.  Cardiovascular:  Rate and Rhythm: Normal rate and regular rhythm.     Heart sounds: No murmur heard. Pulmonary:     Effort: Pulmonary effort is normal. No respiratory distress.     Breath sounds: Normal breath sounds.  Abdominal:     Palpations: Abdomen is soft.     Tenderness: There is no abdominal tenderness. There is no right CVA tenderness, left CVA tenderness, guarding or rebound.     Comments: Tenderness to left lateral abdomen  Musculoskeletal:        General: No swelling.     Cervical back: Neck supple.  Skin:    General: Skin is warm and dry.     Capillary Refill: Capillary refill takes less than 2 seconds.  Neurological:     General: No focal deficit present.     Mental Status: She is alert and oriented to person, place, and time.   Psychiatric:        Mood and Affect: Mood normal.     ED Results / Procedures / Treatments   Labs (all labs ordered are listed, but only abnormal results are displayed) Labs Reviewed  URINALYSIS, W/ REFLEX TO CULTURE (INFECTION SUSPECTED) - Abnormal; Notable for the following components:      Result Value   APPearance CLOUDY (*)    Hgb urine dipstick MODERATE (*)    Protein, ur 30 (*)    Leukocytes,Ua MODERATE (*)    Bacteria, UA FEW (*)    All other components within normal limits  COMPREHENSIVE METABOLIC PANEL WITH GFR - Abnormal; Notable for the following components:   Glucose, Bld 125 (*)    BUN 21 (*)    Creatinine, Ser 1.61 (*)    GFR, Estimated 41 (*)    All other components within normal limits  URINE CULTURE  CULTURE, BLOOD (ROUTINE X 2)  CULTURE, BLOOD (ROUTINE X 2)  CBC WITH DIFFERENTIAL/PLATELET  LIPASE, BLOOD  PREGNANCY, URINE  BASIC METABOLIC PANEL WITH GFR  CBC  HIV ANTIBODY (ROUTINE TESTING W REFLEX)    EKG None  Radiology CT L-SPINE NO CHARGE Result Date: 05/07/2023 CLINICAL DATA:  Flank and abdominal pain.  Chronic low back pain. EXAM: CT LUMBAR SPINE WITHOUT CONTRAST TECHNIQUE: Multidetector CT imaging of the lumbar spine was performed without intravenous contrast administration. Multiplanar CT image reconstructions were also generated. RADIATION DOSE REDUCTION: This exam was performed according to the departmental dose-optimization program which includes automated exposure control, adjustment of the mA and/or kV according to patient size and/or use of iterative reconstruction technique. COMPARISON:  None Available. FINDINGS: Segmentation: 5 lumbar type vertebrae. Alignment: Normal Vertebrae: No acute fracture or focal pathologic process. Paraspinal and other soft tissues: Paraspinal soft tissues unremarkable. Right hydronephrosis and perinephric stranding due to renal pelvic stone. See abdominal CT report. Disc levels: Maintained.  No disc herniation.  IMPRESSION: No acute bony abnormality. Right renal pelvic stone with hydronephrosis and perinephric stranding. See abdominal CT report today. Electronically Signed   By: Charlett Nose M.D.   On: 05/07/2023 17:23   CT ABDOMEN PELVIS WO CONTRAST Result Date: 05/07/2023 CLINICAL DATA:  Abdominal pain, flank pain EXAM: CT ABDOMEN AND PELVIS WITHOUT CONTRAST TECHNIQUE: Multidetector CT imaging of the abdomen and pelvis was performed following the standard protocol without IV contrast. RADIATION DOSE REDUCTION: This exam was performed according to the departmental dose-optimization program which includes automated exposure control, adjustment of the mA and/or kV according to patient size and/or use of iterative reconstruction technique. COMPARISON:  None Available. FINDINGS: Lower chest: Ground-glass  opacities in the lung bases, favor atelectasis. No effusions. Hepatobiliary: Gallbladder mildly distended. No visible stones, wall thickening or changes of cholecystitis. No biliary ductal dilatation or focal hepatic abnormality. Pancreas: No focal abnormality or ductal dilatation. Spleen: No focal abnormality.  Normal size. Adrenals/Urinary Tract: Adrenal glands are normal. There is moderate right hydronephrosis and perinephric stranding. 1.4 x 1.2 cm stone noted in the right renal pelvis at the UPJ. No ureteral stones. 3 mm nonobstructing stone in the midpole of the left kidney. Urinary bladder unremarkable. Stomach/Bowel: Stomach, large and small bowel grossly unremarkable. Vascular/Lymphatic: No evidence of aneurysm or adenopathy. Reproductive: Uterus and adnexa unremarkable. No mass. IUD in the uterus. Other: No free fluid or free air. Musculoskeletal: No acute bony abnormality. IMPRESSION: 1.4 x 1.2 cm right renal pelvic stone near the UPJ with moderate right hydronephrosis and perinephric stranding. Left nephrolithiasis.  No hydronephrosis. Bibasilar atelectasis. Electronically Signed   By: Charlett Nose M.D.   On:  05/07/2023 17:21    Procedures Procedures    Medications Ordered in ED Medications  heparin injection 5,000 Units (has no administration in time range)  acetaminophen (TYLENOL) tablet 650 mg (has no administration in time range)    Or  acetaminophen (TYLENOL) suppository 650 mg (has no administration in time range)  nicotine (NICODERM CQ - dosed in mg/24 hours) patch 21 mg (21 mg Transdermal Patch Applied 05/07/23 2026)  hydrALAZINE (APRESOLINE) injection 5 mg (has no administration in time range)  cefTRIAXone (ROCEPHIN) 2 g in sodium chloride 0.9 % 100 mL IVPB (has no administration in time range)  buPROPion (WELLBUTRIN XL) 24 hr tablet 300 mg (has no administration in time range)  clonazePAM (KLONOPIN) tablet 0.5 mg (has no administration in time range)  sertraline (ZOLOFT) tablet 100 mg (has no administration in time range)  buprenorphine-naloxone (SUBOXONE) 8-2 mg per SL tablet 2 tablet (has no administration in time range)  pantoprazole (PROTONIX) injection 40 mg (has no administration in time range)  lactated ringers infusion (has no administration in time range)  ketorolac (TORADOL) 15 MG/ML injection 15 mg (15 mg Intravenous Given 05/07/23 1355)  ondansetron (ZOFRAN) injection 4 mg (4 mg Intravenous Given 05/07/23 1356)  lactated ringers bolus 1,000 mL (1,000 mLs Intravenous New Bag/Given 05/07/23 1644)  cefTRIAXone (ROCEPHIN) 1 g in sodium chloride 0.9 % 100 mL IVPB (0 g Intravenous Stopped 05/07/23 1644)  lactated ringers bolus 1,000 mL (1,000 mLs Intravenous New Bag/Given 05/07/23 1645)  buprenorphine-naloxone (SUBOXONE) 8-2 mg per SL tablet 1 tablet (1 tablet Sublingual Given 05/07/23 1829)  cefTRIAXone (ROCEPHIN) 1 g in sodium chloride 0.9 % 100 mL IVPB (1 g Intravenous New Bag/Given 05/07/23 2029)    ED Course/ Medical Decision Making/ A&P Clinical Course as of 05/07/23 2102  Fri May 07, 2023  1332 Patient here complaining of left flank pain that is severe, the setting of  vague abdominal pain for the past week that became severe this morning.  Labs are pending, will add on imaging for possible stone giving colicky flank pain radiating into her abdomen.  Will also order no L-spine as if she is having some pain radiating into her right posterior thigh with this as well.  Of note, no red flag signs for cauda equina, epidural or perispinous abscess. [CB]  1554 CT ABDOMEN PELVIS WO CONTRAST [CB]  1602 CT ABDOMEN PELVIS WO CONTRAST [CB]    Clinical Course User Index [CB] Carmel Sacramento A, PA-C  Medical Decision Making This patient presents to the ED for concern of back pain, this involves an extensive number of treatment options, and is a complaint that carries with it a high risk of complications and morbidity.  The differential diagnosis includes pyelonephritis, ureterolithiasis, muscle strain, HNP, other   Co morbidities that complicate the patient evaluation :   Chronic back pain   Additional history obtained:  Additional history obtained from EMR External records from outside source obtained and reviewed including prior notes and labs   Lab Tests:  I Ordered, and personally interpreted labs.  The pertinent results include: UA shows moderate leukocytes with greater than 50 red blood cells, greater than 50 white blood cells few bacteria and 11-20 squamous epithelial cells; patient not pregnant; CBC is normal; CMP shows elevation of BUN/creatinine with BUN 21 creatinine 1.6; lipase normal   Imaging Studies ordered:  I ordered imaging studies including ct abd/pelvis which shows 1.2X1.4 CM R renal pelvis stone  I independently visualized and interpreted imaging within scope of identifying emergent findings  I agree with the radiologist interpretation     Consultations Obtained:  I requested consultation with the neurologist Dr. Ronne Binning,  and discussed lab and imaging findings as well as pertinent plan - they recommend:  Admit for obs, keep patient n.p.o. after midnight in case she needs nephrostomy tube tomorrow morning.  Discussed with Dr. Randol Kern with hospitalist for admission who is agreeable.   Problem List / ED Course / Critical interventions / Medication management  Ureterolithiasis with UTI-patient has been having some vague back pain and abdominal pain for the past week but had severe pain this morning, has an AKI, UTI, given IV antibiotics, IV fluids, CT shows large right UPJ stone with moderate hydronephrosis and perinephric stranding.  Consulted with urology as above, patient admitted hospitalist, kept n.p.o. after midnight. I ordered medication including bowel for pain Reevaluation of the patient after these medicines showed that the patient improved I have reviewed the patients home medicines and have made adjustments as needed       Amount and/or Complexity of Data Reviewed Labs: ordered. Radiology: ordered. Decision-making details documented in ED Course.  Risk Prescription drug management. Decision regarding hospitalization.           Final Clinical Impression(s) / ED Diagnoses Final diagnoses:  None    Rx / DC Orders ED Discharge Orders     None         Josem Kaufmann 05/07/23 2102    Terrilee Files, MD 05/08/23 [phone]

## 2023-05-07 NOTE — Progress Notes (Signed)
   05/07/23 2123  TOC Brief Assessment  Insurance and Status Reviewed  Patient has primary care physician Yes  Home environment has been reviewed From home.  Prior level of function: Independent.  Prior/Current Home Services No current home services  Social Drivers of Health Review SDOH reviewed interventions complete (smoking cessation added to AVS.)  Readmission risk has been reviewed Yes  Transition of care needs no transition of care needs at this time   Transition of Care Department [facility]) has reviewed patient and no other TOC needs have been identified at this time. We will continue to monitor patient advancement through interdisciplinary progression rounds. If new patient needs arise, please place a TOC consult.

## 2023-05-08 ENCOUNTER — Other Ambulatory Visit: Payer: Self-pay | Admitting: Urology

## 2023-05-08 DIAGNOSIS — N200 Calculus of kidney: Secondary | ICD-10-CM | POA: Diagnosis not present

## 2023-05-08 LAB — BASIC METABOLIC PANEL WITH GFR
Anion gap: 5 (ref 5–15)
BUN: 19 mg/dL (ref 6–20)
CO2: 25 mmol/L (ref 22–32)
Calcium: 8.2 mg/dL — ABNORMAL LOW (ref 8.9–10.3)
Chloride: 106 mmol/L (ref 98–111)
Creatinine, Ser: 1.09 mg/dL — ABNORMAL HIGH (ref 0.44–1.00)
GFR, Estimated: >60 mL/min (ref >60)
Glucose, Bld: 124 mg/dL — ABNORMAL HIGH (ref 70–99)
Potassium: 3.4 mmol/L — ABNORMAL LOW (ref 3.5–5.1)
Sodium: 136 mmol/L (ref 135–145)

## 2023-05-08 LAB — CBC
HCT: 32.9 % — ABNORMAL LOW (ref 36.0–46.0)
Hemoglobin: 10.5 g/dL — ABNORMAL LOW (ref 12.0–15.0)
MCH: 28.3 pg (ref 26.0–34.0)
MCHC: 31.9 g/dL (ref 30.0–36.0)
MCV: 88.7 fL (ref 80.0–100.0)
Platelets: 126 10*3/uL — ABNORMAL LOW (ref 150–400)
RBC: 3.71 MIL/uL — ABNORMAL LOW (ref 3.87–5.11)
RDW: 13.5 % (ref 11.5–15.5)
WBC: 5.3 10*3/uL (ref 4.0–10.5)
nRBC: 0 % (ref 0.0–0.2)

## 2023-05-08 LAB — HIV ANTIBODY (ROUTINE TESTING W REFLEX): HIV Screen 4th Generation wRfx: NONREACTIVE

## 2023-05-08 MED ORDER — BUPRENORPHINE HCL-NALOXONE HCL 8-2 MG SL SUBL: 2.0000 | SUBLINGUAL_TABLET | Freq: Two times a day (BID) | SUBLINGUAL | Status: DC

## 2023-05-08 MED ORDER — KETOROLAC TROMETHAMINE 10 MG PO TABS: 10.0000 mg | ORAL_TABLET | Freq: Four times a day (QID) | ORAL | 0 refills | Status: DC | PRN

## 2023-05-08 MED ORDER — POTASSIUM CHLORIDE CRYS ER 20 MEQ PO TBCR
20.0000 meq | EXTENDED_RELEASE_TABLET | Freq: Once | ORAL | Status: AC
  Administered 2023-05-08: 20 meq via ORAL
  Filled 2023-05-08: qty 1

## 2023-05-08 MED ORDER — LISINOPRIL 20 MG PO TABS: 20.0000 mg | ORAL_TABLET | Freq: Every day | ORAL | Status: AC

## 2023-05-08 MED ORDER — BUPRENORPHINE HCL-NALOXONE HCL 8-2 MG SL SUBL
2.0000 | SUBLINGUAL_TABLET | Freq: Two times a day (BID) | SUBLINGUAL | Status: DC
  Administered 2023-05-08: 2 via SUBLINGUAL
  Filled 2023-05-08: qty 2

## 2023-05-08 MED ORDER — SULFAMETHOXAZOLE-TRIMETHOPRIM 800-160 MG PO TABS: 1.0000 | ORAL_TABLET | Freq: Two times a day (BID) | ORAL | 0 refills | Status: AC

## 2023-05-08 MED ORDER — KETOROLAC TROMETHAMINE 15 MG/ML IJ SOLN
15.0000 mg | Freq: Four times a day (QID) | INTRAMUSCULAR | Status: DC | PRN
  Administered 2023-05-08: 15 mg via INTRAVENOUS
  Filled 2023-05-08: qty 1

## 2023-05-08 NOTE — Consult Note (Signed)
 Urology Consult  Referring physician: Dr. Jarvis Newcomer Reason for referral: right renal calculus, pain  Chief Complaint: right flank pain  History of Present Illness: Ms Angelica Lopez is a 42yo with a history of chronic pain who presented to the ER with a 1 day history of right flank pain. This is her first stone event. Her pain was sharp, constant, sever eand nonradiating by with toradol her pain is now mild. CT obtained in the ER showed a 1.4cm right renal pelvis stone with mild hydronephrosis. She denies any significant LUTS. No hematuria or dysuria. WBC count normal. Patient afebrile. She had nausea yesterday which has since resolved  Past Medical History:  Diagnosis Date   Anxiety    Back pain    Depression    Hypertension    Kyphosis    Scheuermann's disease    Past Surgical History:  Procedure Laterality Date   CESAREAN SECTION     TONSILLECTOMY      Medications: I have reviewed the patient's current medications. Allergies:  Allergies  Allergen Reactions   Codeine Anaphylaxis    Tongue swells    Family History  Problem Relation Age of Onset   Heart disease Maternal Grandfather        has had 3 open heart surgeries   Hypertension Father    Diabetes Father    Anxiety disorder Father    Depression Father    Anxiety disorder Mother    Depression Mother    Febrile seizures Daughter    Febrile seizures Daughter    Febrile seizures Daughter    Social History:  reports that she has been smoking cigarettes. She has a 30 pack-year smoking history. She has never used smokeless tobacco. She reports current drug use. Drug: Marijuana. She reports that she does not drink alcohol.  Review of Systems  Genitourinary:  Positive for flank pain.  All other systems reviewed and are negative.   Physical Exam:  Vital signs in last 24 hours: Temp:  [98.1 F (36.7 C)-98.7 F (37.1 C)] 98.3 F (36.8 C) (03/29 0933) Pulse Rate:  [56-75] 69 (03/29 0933) Resp:  [15-18] 16 (03/29 0933) BP:  (93-146)/(50-89) 132/67 (03/29 0933) SpO2:  [92 %-98 %] 97 % (03/29 0933) Physical Exam Vitals reviewed.  Constitutional:      Appearance: Normal appearance.  HENT:     Head: Normocephalic and atraumatic.     Mouth/Throat:     Mouth: Mucous membranes are dry.  Eyes:     Extraocular Movements: Extraocular movements intact.     Pupils: Pupils are equal, round, and reactive to light.  Cardiovascular:     Rate and Rhythm: Normal rate and regular rhythm.  Pulmonary:     Effort: Pulmonary effort is normal. No respiratory distress.  Abdominal:     General: Abdomen is flat. There is no distension.  Musculoskeletal:        General: No swelling. Normal range of motion.     Cervical back: Normal range of motion and neck supple.  Skin:    General: Skin is warm and dry.  Neurological:     General: No focal deficit present.     Mental Status: She is alert and oriented to person, place, and time.  Psychiatric:        Mood and Affect: Mood normal.        Behavior: Behavior normal.        Thought Content: Thought content normal.        Judgment: Judgment normal.  Laboratory Data:  Results for orders placed or performed during the hospital encounter of 05/07/23 (from the past 72 hours)  Urinalysis, w/ Reflex to Culture (Infection Suspected) -Urine, Clean Catch     Status: Abnormal   Collection Time: 05/07/23  1:59 AM  Result Value Ref Range   Specimen Source URINE, CLEAN CATCH    Color, Urine YELLOW YELLOW   APPearance CLOUDY (A) CLEAR   Specific Gravity, Urine 1.017 1.005 - 1.030   pH 7.0 5.0 - 8.0   Glucose, UA NEGATIVE NEGATIVE mg/dL   Hgb urine dipstick MODERATE (A) NEGATIVE   Bilirubin Urine NEGATIVE NEGATIVE   Ketones, ur NEGATIVE NEGATIVE mg/dL   Protein, ur 30 (A) NEGATIVE mg/dL   Nitrite NEGATIVE NEGATIVE   Leukocytes,Ua MODERATE (A) NEGATIVE   RBC / HPF >50 0 - 5 RBC/hpf   WBC, UA >50 0 - 5 WBC/hpf    Comment:        Reflex urine culture not performed if WBC <=10,  OR if Squamous epithelial cells >5. If Squamous epithelial cells >5 suggest recollection.    Bacteria, UA FEW (A) NONE SEEN   Squamous Epithelial / HPF 11-20 0 - 5 /HPF   Mucus PRESENT     Comment: Performed at [facility], [address] [place], [place] [address]  Pregnancy, urine     Status: None   Collection Time: 05/07/23  1:59 AM  Result Value Ref Range   Preg Test, Ur NEGATIVE NEGATIVE    Comment:        THE SENSITIVITY OF THIS METHODOLOGY IS >25 mIU/mL. Performed at [facility], [address], [place], [place] [address]   CBC with Differential     Status: None   Collection Time: 05/07/23  1:57 PM  Result Value Ref Range   WBC 6.9 4.0 - 10.5 K/uL   RBC 4.97 3.87 - 5.11 MIL/uL   Hemoglobin 13.7 12.0 - 15.0 g/dL   HCT 69.6 29.5 - 28.4 %   MCV 87.3 80.0 - 100.0 fL   MCH 27.6 26.0 - 34.0 pg   MCHC 31.6 30.0 - 36.0 g/dL   RDW 13.2 44.0 - 10.2 %   Platelets 180 150 - 400 K/uL   nRBC 0.0 0.0 - 0.2 %   Neutrophils Relative % 82 %   Neutro Abs 5.7 1.7 - 7.7 K/uL   Lymphocytes Relative 13 %   Lymphs Abs 0.9 0.7 - 4.0 K/uL   Monocytes Relative 4 %   Monocytes Absolute 0.3 0.1 - 1.0 K/uL   Eosinophils Relative 1 %   Eosinophils Absolute 0.1 0.0 - 0.5 K/uL   Basophils Relative 0 %   Basophils Absolute 0.0 0.0 - 0.1 K/uL   Immature Granulocytes 0 %   Abs Immature Granulocytes 0.02 0.00 - 0.07 K/uL    Comment: Performed at [facility], [address], [place], [place] [address]  Comprehensive metabolic panel     Status: Abnormal   Collection Time: 05/07/23  1:57 PM  Result Value Ref Range   Sodium 136 135 - 145 mmol/L   Potassium 4.5 3.5 - 5.1 mmol/L   Chloride 102 98 - 111 mmol/L   CO2 24 22 - 32 mmol/L   Glucose, Bld 125 (H) 70 - 99 mg/dL    Comment: Glucose reference range applies only to samples taken after fasting for at least 8 hours.   BUN 21 (H) 6 - 20 mg/dL   Creatinine, Ser 6.44 (H) 0.44 - 1.00 mg/dL  Calcium 9.4 8.9 - 10.3 mg/dL   Total Protein 7.8 6.5 -  8.1 g/dL   Albumin 4.2 3.5 - 5.0 g/dL   AST 30 15 - 41 U/L   ALT 16 0 - 44 U/L   Alkaline Phosphatase 61 38 - 126 U/L   Total Bilirubin 0.8 0.0 - 1.2 mg/dL   GFR, Estimated 41 (L) >60 mL/min    Comment: (NOTE) Calculated using the CKD-EPI Creatinine Equation (2021)    Anion gap 10 5 - 15    Comment: Performed at [facility], [address], [place], [place] [address]  Lipase, blood     Status: None   Collection Time: 05/07/23  1:57 PM  Result Value Ref Range   Lipase 25 11 - 51 U/L    Comment: Performed at [facility], [address], [place], [place] [address]  Culture, blood (Routine X 2) w Reflex to ID Panel     Status: None (Preliminary result)   Collection Time: 05/07/23  7:26 PM   Specimen: Left Antecubital; Blood  Result Value Ref Range   Specimen Description LEFT ANTECUBITAL    Special Requests NONE    Culture      NO GROWTH < 12 HOURS Performed at [facility], [address] [place], [place] [address]    Report Status PENDING   HIV Antibody (routine testing w rflx)     Status: None   Collection Time: 05/07/23  7:26 PM  Result Value Ref Range   HIV Screen 4th Generation wRfx Non Reactive Non Reactive    Comment: Performed at [facility], [address] [place], [place] [address]  Culture, blood (Routine X 2) w Reflex to ID Panel     Status: None (Preliminary result)   Collection Time: 05/07/23  7:32 PM   Specimen: BLOOD RIGHT HAND  Result Value Ref Range   Specimen Description BLOOD RIGHT HAND    Special Requests NONE    Culture      NO GROWTH < 12 HOURS Performed at [facility], [address] [place], [place] [address]    Report Status PENDING   Basic metabolic panel     Status: Abnormal   Collection Time: 05/08/23  4:07 AM  Result Value Ref Range   Sodium 136 135 - 145 mmol/L   Potassium 3.4 (L) 3.5 - 5.1 mmol/L   Chloride 106 98 - 111 mmol/L   CO2 25 22 - 32 mmol/L   Glucose, Bld 124 (H) 70 - 99 mg/dL    Comment: Glucose reference range  applies only to samples taken after fasting for at least 8 hours.   BUN 19 6 - 20 mg/dL   Creatinine, Ser 9.62 (H) 0.44 - 1.00 mg/dL   Calcium 8.2 (L) 8.9 - 10.3 mg/dL   GFR, Estimated >95 >28 mL/min    Comment: (NOTE) Calculated using the CKD-EPI Creatinine Equation (2021)    Anion gap 5 5 - 15    Comment: Performed at [facility], [address], [place], [place] [address]  CBC     Status: Abnormal   Collection Time: 05/08/23  7:30 AM  Result Value Ref Range   WBC 5.3 4.0 - 10.5 K/uL   RBC 3.71 (L) 3.87 - 5.11 MIL/uL   Hemoglobin 10.5 (L) 12.0 - 15.0 g/dL   HCT 40.1 (L) 02.7 - 25.3 %   MCV 88.7 80.0 - 100.0 fL   MCH 28.3 26.0 - 34.0 pg   MCHC 31.9 30.0 -  36.0 g/dL   RDW 82.9 56.2 - 13.0 %   Platelets 126 (L) 150 - 400 K/uL   nRBC 0.0 0.0 - 0.2 %    Comment: Performed at [facility], [address], [place], [place] [address]   Recent Results (from the past 240 hours)  Culture, blood (Routine X 2) w Reflex to ID Panel     Status: None (Preliminary result)   Collection Time: 05/07/23  7:26 PM   Specimen: Left Antecubital; Blood  Result Value Ref Range Status   Specimen Description LEFT ANTECUBITAL  Final   Special Requests NONE  Final   Culture   Final    NO GROWTH < 12 HOURS Performed at [facility], [address] [place], [place] [address]    Report Status PENDING  Incomplete  Culture, blood (Routine X 2) w Reflex to ID Panel     Status: None (Preliminary result)   Collection Time: 05/07/23  7:32 PM   Specimen: BLOOD RIGHT HAND  Result Value Ref Range Status   Specimen Description BLOOD RIGHT HAND  Final   Special Requests NONE  Final   Culture   Final    NO GROWTH < 12 HOURS Performed at [facility], [address], [place], [place] [address]    Report Status PENDING  Incomplete   Creatinine: Recent Labs    05/07/23 1357 05/08/23 0407  CREATININE 1.61* 1.09*   Baseline Creatinine: 0.6  Impression/Assessment:  42yo with right renal calculus  Plan:   -We discussed the management of kidney stones. These options include observation, ureteroscopy, shockwave lithotripsy (ESWL) and percutaneous nephrolithotomy (PCNL). We discussed which options are relevant to the patient's stone(s). We discussed the natural history of kidney stones as well as the complications of untreated stones and the impact on quality of life without treatment as well as with each of the above listed treatments. We also discussed the efficacy of each treatment in its ability to clear the stone burden. With any of these management options I discussed the signs and symptoms of infection and the need for emergent treatment should these be experienced. For each option we discussed the ability of each procedure to clear the patient of their stone burden.   For observation I described the risks which include but are not limited to silent renal damage, life-threatening infection, need for emergent surgery, failure to pass stone and pain.   For ureteroscopy I described the risks which include bleeding, infection, damage to contiguous structures, positioning injury, ureteral stricture, ureteral avulsion, ureteral injury, need for prolonged ureteral stent, inability to perform ureteroscopy, need for an interval procedure, inability to clear stone burden, stent discomfort/pain, heart attack, stroke, pulmonary embolus and the inherent risks with general anesthesia.   For shockwave lithotripsy I described the risks which include arrhythmia, kidney contusion, kidney hemorrhage, need for transfusion, pain, inability to adequately break up stone, inability to pass stone fragments, Steinstrasse, infection associated with obstructing stones, need for alternate surgical procedure, need for repeat shockwave lithotripsy, MI, CVA, PE and the inherent risks with anesthesia/conscious sedation.   For PCNL I described the risks including positioning injury, pneumothorax, hydrothorax, need for chest tube,  inability to clear stone burden, renal laceration, arterial venous fistula or malformation, need for embolization of kidney, loss of kidney or renal function, need for repeat procedure, need for prolonged nephrostomy tube, ureteral avulsion, MI, CVA, PE and the inherent risks of general anesthesia.   - The patient would like to proceed with right ureteroscopic stone  extraction. Patient can be discharge home with toradol and she will be scheduled for outpatient ureteroscopy  Wilkie Aye 05/08/2023, 2:15 PM

## 2023-05-08 NOTE — Plan of Care (Signed)

## 2023-05-08 NOTE — Discharge Summary (Addendum)
 Physician Discharge Summary   Patient: Angelica Lopez MRN: [id number] DOB: 1980/11/20  Admit date:     05/07/2023  Discharge date: 05/08/23  Discharge Physician: Tyrone Nine   PCP: Vivien Presto, MD   Recommendations at discharge:  Follow up with urology, Dr. Ronne Binning this coming week to schedule ureteroscopic stone extraction. Discharged on bactrim and prn ketorolac.  Recommend PCP follow up in the next 1-2 weeks. Suggest recheck BMP and CBC at follow up. Also recheck BP, opted to hold lisinopril given her AKI and normal BP.  Discharge Diagnoses: Principal Problem:   Nephrolithiasis Active Problems:   Kidney stone   Obesity (BMI 30-39.9)  Hospital Course: Angelica Lopez is a 42 y.o. female with a history of anxiety, depression, HTN, chronic back pain, tobacco use, obesity who presented to the ED on 05/07/2023 with flank pain bilaterally, urinary urgency and frequency. She was afebrile with normal CBC, found to have obstructing right UPJ nephrolithiasis with hydronephrosis and AKI (SCr 1.61 from normal baseline) and hematuria. She was admitted, given IV fluids, toradol, and ceftriaxone. She's remained stable, afebrile without leukocytosis, much improved symptomatically, with significant improvement in renal parameters. Urology and medical teams have cleared the patient for discharge with plans for definitive stone treatment this coming week.   Assessment and Plan: Obstructive right nephrolithiasis: 1.2 x 1.4cm stone at UPJ and associated mild hydronephrosis.  - Dr. Ronne Binning has spoken with the patient who opts to go home on NSAID and antibiotics, will be contacted Monday to schedule ureteroscopic stone extraction.   AKI due to obstructive uropathy and dehydration: No casts on UA. SCr improved significantly with IV fluids.  - Monitor at follow up, remain well hydrated.    Anemia: Possibly due to hematuria, unveiled by IV fluid resuscitation. Unknown recent baseline, though in 2023  and later hgb was normal.  - Monitor. Hemodynamically stable at this time without ongoing bleeding.    Thrombocytopenia:  - Monitor at follow up   Hypokalemia:  - Supplemented   Depression/anxiety:  - Continue bupropion, SSRI, prn clonazepam.   Tobacco use:  - Nicotine patch, cessation counseling   Chronic pain:  - Continue suboxone    HTN:  - Can restart lisinopril with improvement in renal function   Obesity: Body mass index is 33.07 kg/m.   Consultants: Urology Procedures performed: None  Disposition: Home Diet recommendation: As tolerated DISCHARGE MEDICATION: Allergies as of 05/08/2023       Reactions   Codeine Anaphylaxis   Tongue swells        Medication List     TAKE these medications    Buprenorphine HCl-Naloxone HCl 8-2 MG Film Place 3 Film under the tongue daily.   buPROPion 300 MG 24 hr tablet Commonly known as: WELLBUTRIN XL Take 300 mg by mouth daily.   cetirizine 10 MG tablet Commonly known as: ZyrTEC Allergy Take 1 tablet (10 mg total) by mouth daily.   clonazePAM 0.5 MG tablet Commonly known as: KLONOPIN Take 1 tablet (0.5 mg total) by mouth every other day as needed for up to 4 days for anxiety. What changed: when to take this   hydrOXYzine 25 MG tablet Commonly known as: ATARAX Take 25 mg by mouth daily as needed for anxiety or itching.   ketorolac 10 MG tablet Commonly known as: TORADOL Take 1 tablet (10 mg total) by mouth every 6 (six) hours as needed for moderate pain (pain score 4-6) or severe pain (pain score 7-10).   levonorgestrel 20  MCG/24HR IUD Commonly known as: MIRENA 1 each by Intrauterine route once.   lisinopril 20 MG tablet Commonly known as: ZESTRIL Take 20 mg by mouth daily.   sertraline 100 MG tablet Commonly known as: ZOLOFT Take 1 tablet (100 mg total) by mouth daily. What changed: how much to take   sulfamethoxazole-trimethoprim 800-160 MG tablet Commonly known as: Bactrim DS Take 1 tablet by  mouth 2 (two) times daily for 10 days.   traZODone 100 MG tablet Commonly known as: DESYREL Take 100 mg by mouth at bedtime.        Follow-up Information     Corrington, Kip A, MD Follow up.   Specialty: Family Medicine Contact information: [address] [place] [address] [phone]         Malen Gauze, MD. Schedule an appointment as soon as possible for a visit.   Specialty: Urology Contact information: [address]  [place] [address] [phone]                Discharge Exam: Ceasar Mons Weights   05/07/23 1215  Weight: 79.4 kg  BP 132/67 (BP Location: Left Arm)   Pulse 69   Temp 98.3 F (36.8 C) (Oral)   Resp 16   Ht 5\' 1"  (1.549 m)   Wt 79.4 kg   SpO2 97%   BMI 33.07 kg/m   Well-appearing female in no acute distress Clear, nonlabored RRR, no MRG or edema Soft, NT, ND, +BS, mild CVA tenderness to percussion R > L  Condition at discharge: stable  The results of significant diagnostics from this hospitalization (including imaging, microbiology, ancillary and laboratory) are listed below for reference.   Imaging Studies: CT L-SPINE NO CHARGE Result Date: 05/07/2023 CLINICAL DATA:  Flank and abdominal pain.  Chronic low back pain. EXAM: CT LUMBAR SPINE WITHOUT CONTRAST TECHNIQUE: Multidetector CT imaging of the lumbar spine was performed without intravenous contrast administration. Multiplanar CT image reconstructions were also generated. RADIATION DOSE REDUCTION: This exam was performed according to the departmental dose-optimization program which includes automated exposure control, adjustment of the mA and/or kV according to patient size and/or use of iterative reconstruction technique. COMPARISON:  None Available. FINDINGS: Segmentation: 5 lumbar type vertebrae. Alignment: Normal Vertebrae: No acute fracture or focal pathologic process. Paraspinal and other soft tissues: Paraspinal soft tissues unremarkable. Right  hydronephrosis and perinephric stranding due to renal pelvic stone. See abdominal CT report. Disc levels: Maintained.  No disc herniation. IMPRESSION: No acute bony abnormality. Right renal pelvic stone with hydronephrosis and perinephric stranding. See abdominal CT report today. Electronically Signed   By: Charlett Nose M.D.   On: 05/07/2023 17:23   CT ABDOMEN PELVIS WO CONTRAST Result Date: 05/07/2023 CLINICAL DATA:  Abdominal pain, flank pain EXAM: CT ABDOMEN AND PELVIS WITHOUT CONTRAST TECHNIQUE: Multidetector CT imaging of the abdomen and pelvis was performed following the standard protocol without IV contrast. RADIATION DOSE REDUCTION: This exam was performed according to the departmental dose-optimization program which includes automated exposure control, adjustment of the mA and/or kV according to patient size and/or use of iterative reconstruction technique. COMPARISON:  None Available. FINDINGS: Lower chest: Ground-glass opacities in the lung bases, favor atelectasis. No effusions. Hepatobiliary: Gallbladder mildly distended. No visible stones, wall thickening or changes of cholecystitis. No biliary ductal dilatation or focal hepatic abnormality. Pancreas: No focal abnormality or ductal dilatation. Spleen: No focal abnormality.  Normal size. Adrenals/Urinary Tract: Adrenal glands are normal. There is moderate right hydronephrosis and perinephric  stranding. 1.4 x 1.2 cm stone noted in the right renal pelvis at the UPJ. No ureteral stones. 3 mm nonobstructing stone in the midpole of the left kidney. Urinary bladder unremarkable. Stomach/Bowel: Stomach, large and small bowel grossly unremarkable. Vascular/Lymphatic: No evidence of aneurysm or adenopathy. Reproductive: Uterus and adnexa unremarkable. No mass. IUD in the uterus. Other: No free fluid or free air. Musculoskeletal: No acute bony abnormality. IMPRESSION: 1.4 x 1.2 cm right renal pelvic stone near the UPJ with moderate right hydronephrosis and  perinephric stranding. Left nephrolithiasis.  No hydronephrosis. Bibasilar atelectasis. Electronically Signed   By: Charlett Nose M.D.   On: 05/07/2023 17:21    Microbiology: Results for orders placed or performed during the hospital encounter of 05/07/23  Culture, blood (Routine X 2) w Reflex to ID Panel     Status: None (Preliminary result)   Collection Time: 05/07/23  7:26 PM   Specimen: Left Antecubital; Blood  Result Value Ref Range Status   Specimen Description LEFT ANTECUBITAL  Final   Special Requests NONE  Final   Culture   Final    NO GROWTH < 12 HOURS Performed at [facility], [address] [place], [place] [address]    Report Status PENDING  Incomplete  Culture, blood (Routine X 2) w Reflex to ID Panel     Status: None (Preliminary result)   Collection Time: 05/07/23  7:32 PM   Specimen: BLOOD RIGHT HAND  Result Value Ref Range Status   Specimen Description BLOOD RIGHT HAND  Final   Special Requests NONE  Final   Culture   Final    NO GROWTH < 12 HOURS Performed at [facility], [address], [place], [place] [address]    Report Status PENDING  Incomplete    Labs: CBC: Recent Labs  Lab 05/07/23 1357 05/08/23 0730  WBC 6.9 5.3  NEUTROABS 5.7  --   HGB 13.7 10.5*  HCT 43.4 32.9*  MCV 87.3 88.7  PLT 180 126*   Basic Metabolic Panel: Recent Labs  Lab 05/07/23 1357 05/08/23 0407  NA 136 136  K 4.5 3.4*  CL 102 106  CO2 24 25  GLUCOSE 125* 124*  BUN 21* 19  CREATININE 1.61* 1.09*  CALCIUM 9.4 8.2*   Liver Function Tests: Recent Labs  Lab 05/07/23 1357  AST 30  ALT 16  ALKPHOS 61  BILITOT 0.8  PROT 7.8  ALBUMIN 4.2   CBG: No results for input(s): "GLUCAP" in the last 168 hours.  Discharge time spent: greater than 30 minutes.  Signed: Tyrone Nine, MD [facility] 05/08/2023

## 2023-05-08 NOTE — Progress Notes (Signed)
 [facility] PROGRESS NOTE  Nohealani FARYN SIEG (DOB: 01/14/1981) WUJ:[id number] PCP: Vivien Presto, MD  Brief Narrative: Angelica Lopez is a 43 y.o. female with a history of anxiety, depression, HTN, chronic back pain, tobacco use, obesity who presented to the ED on 05/07/2023 with flank pain bilaterally, urinary urgency and frequency. She was afebrile with normal CBC, found to have obstructing right UPJ nephrolithiasis with hydronephrosis and AKI (SCr 1.61 from normal baseline) and hematuria. She was admitted, given IV fluids, toradol, and remains NPO on 3/29 pending urology evaluation.   Subjective: Pain much better after toradol but starting to return. No vomiting, no fever.   Objective: BP 111/68 (BP Location: Left Arm)   Pulse 64   Temp 98.3 F (36.8 C) (Tympanic)   Resp 17   Ht 5\' 1"  (1.549 m)   Wt 79.4 kg   SpO2 95%   BMI 33.07 kg/m   Gen: No distress, but evident discomfort. Pulm: Clear, nonlabored  CV: RRR, no MRG or pitting edema GI: Soft, NT, ND, +BS. Mild CVA tenderness to percussion R > L Neuro: Alert and oriented. No new focal deficits. Ext: Warm, no deformities. Skin: No rashes, lesions or ulcers on visualized skin   Assessment & Plan: Obstructive right nephrolithiasis: 1.2 x 1.4cm stone at UPJ and associated hydronephrosis.  - Remain NPO pending urology evaluation.   AKI due to obstructive uropathy and dehydration: No casts on UA. SCr improved significantly with IV fluids, still above baseline.  - Will continue lower dose, time-limited NSAID prn as it is standard of care - Continue IVF and BMP monitoring.  - Continue IVF  Anemia: Possibly due to hematuria, unveiled by IV fluid resuscitation. Unknown recent baseline, though in 2023 and later hgb was normal.  - Monitor. Hemodynamically stable at this time without ongoing bleeding.   Thrombocytopenia:  - Monitor as above, consider holding VTE ppx   Hypokalemia:  - Supplement  Depression/anxiety:  -  Continue bupropion, SSRI, prn clonazepam.  Tobacco use:  - Nicotine patch, cessation counseling  Chronic pain:  - Continue suboxone   HTN:  - Hold lisinopril given AKI  Obesity: Body mass index is 33.07 kg/m.   Tyrone Nine, MD [facility] [email or web address] 05/08/2023, 9:13 AM

## 2023-05-10 ENCOUNTER — Telehealth: Payer: Self-pay | Admitting: Urology

## 2023-05-10 LAB — URINE CULTURE

## 2023-05-10 NOTE — Telephone Encounter (Signed)
 Spoke with patient and updated in surgery work que

## 2023-05-10 NOTE — Telephone Encounter (Signed)
 FYI and advise

## 2023-05-10 NOTE — Telephone Encounter (Signed)
 Left message wanting to schedule surgery with Dr Ronne Binning

## 2023-05-12 ENCOUNTER — Ambulatory Visit: Admitting: Urology

## 2023-05-12 ENCOUNTER — Encounter: Payer: Self-pay | Admitting: Urology

## 2023-05-12 VITALS — BP 128/61 | HR 67

## 2023-05-12 DIAGNOSIS — N200 Calculus of kidney: Secondary | ICD-10-CM | POA: Diagnosis not present

## 2023-05-12 LAB — CULTURE, BLOOD (ROUTINE X 2)
Culture: NO GROWTH
Culture: NO GROWTH

## 2023-05-12 NOTE — Patient Instructions (Signed)
Percutaneous Nephrolithotomy Percutaneous nephrolithotomy is a procedure to remove kidney stones. Kidney stones are rock-like masses that form inside the kidneys. You may need this procedure if: You have kidney stones that are bigger than 2 cm (0.78 inch) wide. Your kidney stones have an odd shape to them. Other treatments have not worked. You have an infection brought on by the kidney stones. Tell a health care provider about: Any allergies you have. All medicines you are taking, including vitamins, herbs, eye drops, creams, and over-the-counter medicines. Any problems you or family members have had with anesthesia. Any bleeding problems you have. Any surgeries you have had. Any medical conditions you have. Whether you are pregnant or may be pregnant. What are the risks? Your health care provider will talk with you about risks. These may include: Infection. Bleeding. This may include blood in your pee (urine). Allergic reactions to medicines. Damage to other structures or organs. Damage to the kidney. Holes in the kidney. In most cases, these holes heal on their own. Numbness or tingling. Not being able to remove all the stones. If this happens, you may need more treatment. What happens before the procedure? When to stop eating and drinking Follow instructions from your provider about what you may eat and drink. These may include: 8 hours before your procedure Stop eating most foods. Do not eat meat, fried foods, or fatty foods. Eat only light foods, such as toast or crackers. All liquids are okay except energy drinks and alcohol. 6 hours before your procedure Stop eating. Drink only clear liquids, such as water, clear fruit juice, black coffee, plain tea, and sports drinks. Do not drink energy drinks or alcohol. 2 hours before your procedure Stop drinking all liquids. You may be allowed to take medicines with small sips of water. If you do not follow your provider's  instructions, your procedure may be delayed or canceled. Medicines Ask your provider about: Changing or stopping your regular medicines. These include any diabetes medicines or blood thinners you take. Taking medicines such as aspirin and ibuprofen. These medicines can thin your blood. Do not take them unless your provider tells you to. Taking over-the-counter medicines, vitamins, herbs, and supplements. Tests You may have tests done. These may include: Blood and pee tests. An electrocardiogram (EKG). This checks how well your heart is working. Imaging studies. These may include an ultrasound or CT scan. They can help find: The size and number (stone burden) of the kidney stones. Where the kidney stones are. Surgery safety Ask your provider: How your surgery site will be marked. What steps will be taken to help prevent infection. These steps may include: Removing hair at the surgery site. Washing skin with a soap that kills germs. Receiving antibiotics. General instructions Plan to have a responsible adult: Take you home from the hospital. You will not be allowed to drive. Care for you for the time you are told. What happens during the procedure?  An IV will be inserted into one of your veins. You will be given: A sedative. This helps you relax. Anesthesia. This keeps you from feeling pain. It will numb certain areas make you fall asleep for surgery. A soft tube (catheter) will be put in your bladder. This will drain pee out of your body during and after the procedure. Your surgeon will make a small incision in your lower back. A tube will be put through the incision into your kidney. Each kidney stone will be removed through this tube. Larger stones may  need to be broken up with a beam of light (laser) or other tools. After all of the stones have been removed, your provider may put in another tube to help drain pee. This may be: A stent. This is put into the tube that connects the  bladder to the kidney (ureter). It helps drain pee from your kidney to your bladder. A nephrostomy tube. This is put in your kidney. It comes out through the incision in your lower back. It helps drain pee or any fluid that builds up while your kidney heals. The incision may be closed with stitches (sutures). A bandage (dressing) will be placed over the incision area. The procedure may vary among providers and hospitals. What happens after the procedure? Your blood pressure, heart rate, breathing rate, and blood oxygen level will be monitored until you leave the hospital or clinic. Any tubes will be removed after 1-2 days if there is only a small amount of blood in your pee. This information is not intended to replace advice given to you by your health care provider. Make sure you discuss any questions you have with your health care provider. Document Revised: 09/25/2021 Document Reviewed: 09/25/2021 Elsevier Patient Education  2024 [employer].

## 2023-05-12 NOTE — Progress Notes (Signed)
 05/12/2023 8:47 AM   Angelica Lopez Oct 18, 1980 [id number]  Referring provider: Vivien Presto, MD [phone],  [place] [address]  No chief complaint on file.   HPI: Angelica Lopez is a 42yo here for followup for nephrolithiasis. She was admitted over the weekend and was diagnosed with a 1.4cm right UPJ/renal pelvis calculus. She is having intermittent right flank pain.  She denies any fevers/chills. No worsening LUTS.    PMH: Past Medical History:  Diagnosis Date   Anxiety    Back pain    Depression    Hypertension    Kyphosis    Scheuermann's disease     Surgical History: Past Surgical History:  Procedure Laterality Date   CESAREAN SECTION     TONSILLECTOMY      Home Medications:  Allergies as of 05/12/2023       Reactions   Codeine Anaphylaxis   Tongue swells        Medication List        Accurate as of May 12, 2023  8:47 AM. If you have any questions, ask your nurse or doctor.          Buprenorphine HCl-Naloxone HCl 8-2 MG Film Place 3 Film under the tongue daily.   buPROPion 300 MG 24 hr tablet Commonly known as: WELLBUTRIN XL Take 300 mg by mouth daily.   cetirizine 10 MG tablet Commonly known as: ZyrTEC Allergy Take 1 tablet (10 mg total) by mouth daily.   clonazePAM 0.5 MG tablet Commonly known as: KLONOPIN Take 1 tablet (0.5 mg total) by mouth every other day as needed for up to 4 days for anxiety. What changed: when to take this   hydrOXYzine 25 MG tablet Commonly known as: ATARAX Take 25 mg by mouth daily as needed for anxiety or itching.   ketorolac 10 MG tablet Commonly known as: TORADOL Take 1 tablet (10 mg total) by mouth every 6 (six) hours as needed for moderate pain (pain score 4-6) or severe pain (pain score 7-10).   levonorgestrel 20 MCG/24HR IUD Commonly known as: MIRENA 1 each by Intrauterine route once.   lisinopril 20 MG tablet Commonly known as: ZESTRIL Take 1 tablet (20 mg total) by mouth  daily. Hold until follow up   sertraline 100 MG tablet Commonly known as: ZOLOFT Take 1 tablet (100 mg total) by mouth daily. What changed: how much to take   sulfamethoxazole-trimethoprim 800-160 MG tablet Commonly known as: Bactrim DS Take 1 tablet by mouth 2 (two) times daily for 10 days.   traZODone 100 MG tablet Commonly known as: DESYREL Take 100 mg by mouth at bedtime.        Allergies:  Allergies  Allergen Reactions   Codeine Anaphylaxis    Tongue swells    Family History: Family History  Problem Relation Age of Onset   Heart disease Maternal Grandfather        has had 3 open heart surgeries   Hypertension Father    Diabetes Father    Anxiety disorder Father    Depression Father    Anxiety disorder Mother    Depression Mother    Febrile seizures Daughter    Febrile seizures Daughter    Febrile seizures Daughter     Social History:  reports that she has been smoking cigarettes. She has a 30 pack-year smoking history. She has never used smokeless tobacco. She reports current drug use. Drug: Marijuana. She reports that she does not  drink alcohol.  ROS: All other review of systems were reviewed and are negative except what is noted above in HPI  Physical Exam: BP 128/61   Pulse 67   Constitutional:  Alert and oriented, No acute distress. HEENT: [place] AT, moist mucus membranes.  Trachea midline, no masses. Cardiovascular: No clubbing, cyanosis, or edema. Respiratory: Normal respiratory effort, no increased work of breathing. GI: Abdomen is soft, nontender, nondistended, no abdominal masses GU: No CVA tenderness.  Lymph: No cervical or inguinal lymphadenopathy. Skin: No rashes, bruises or suspicious lesions. Neurologic: Grossly intact, no focal deficits, moving all 4 extremities. Psychiatric: Normal mood and affect.  Laboratory Data: Lab Results  Component Value Date   WBC 5.3 05/08/2023   HGB 10.5 (L) 05/08/2023   HCT 32.9 (L) 05/08/2023   MCV 88.7  05/08/2023   PLT 126 (L) 05/08/2023    Lab Results  Component Value Date   CREATININE 1.09 (H) 05/08/2023    No results found for: "PSA"  No results found for: "TESTOSTERONE"  No results found for: "HGBA1C"  Urinalysis    Component Value Date/Time   COLORURINE YELLOW 05/07/2023 0159   APPEARANCEUR CLOUDY (A) 05/07/2023 0159   LABSPEC 1.017 05/07/2023 0159   PHURINE 7.0 05/07/2023 0159   GLUCOSEU NEGATIVE 05/07/2023 0159   HGBUR MODERATE (A) 05/07/2023 0159   BILIRUBINUR NEGATIVE 05/07/2023 0159   KETONESUR NEGATIVE 05/07/2023 0159   PROTEINUR 30 (A) 05/07/2023 0159   UROBILINOGEN 0.2 05/16/2012 1121   NITRITE NEGATIVE 05/07/2023 0159   LEUKOCYTESUR MODERATE (A) 05/07/2023 0159    Lab Results  Component Value Date   BACTERIA FEW (A) 05/07/2023    Pertinent Imaging: CT 05/07/2023: Images reviewed and discussed with the patient  No results found for this or any previous visit.  No results found for this or any previous visit.  No results found for this or any previous visit.  No results found for this or any previous visit.  No results found for this or any previous visit.  No results found for this or any previous visit.  No results found for this or any previous visit.  No results found for this or any previous visit.   Assessment & Plan:    1. Nephrolithiasis (Primary) -We discussed the management of kidney stones. These options include observation, ureteroscopy, shockwave lithotripsy (ESWL) and percutaneous nephrolithotomy (PCNL). We discussed which options are relevant to the patient's stone(s). We discussed the natural history of kidney stones as well as the complications of untreated stones and the impact on quality of life without treatment as well as with each of the above listed treatments. We also discussed the efficacy of each treatment in its ability to clear the stone burden. With any of these management options I discussed the signs and symptoms  of infection and the need for emergent treatment should these be experienced. For each option we discussed the ability of each procedure to clear the patient of their stone burden.   For observation I described the risks which include but are not limited to silent renal damage, life-threatening infection, need for emergent surgery, failure to pass stone and pain.   For ureteroscopy I described the risks which include bleeding, infection, damage to contiguous structures, positioning injury, ureteral stricture, ureteral avulsion, ureteral injury, need for prolonged ureteral stent, inability to perform ureteroscopy, need for an interval procedure, inability to clear stone burden, stent discomfort/pain, heart attack, stroke, pulmonary embolus and the inherent risks with general anesthesia.   For shockwave lithotripsy  I described the risks which include arrhythmia, kidney contusion, kidney hemorrhage, need for transfusion, pain, inability to adequately break up stone, inability to pass stone fragments, Steinstrasse, infection associated with obstructing stones, need for alternate surgical procedure, need for repeat shockwave lithotripsy, MI, CVA, PE and the inherent risks with anesthesia/conscious sedation.   For PCNL I described the risks including positioning injury, pneumothorax, hydrothorax, need for chest tube, inability to clear stone burden, renal laceration, arterial venous fistula or malformation, need for embolization of kidney, loss of kidney or renal function, need for repeat procedure, need for prolonged nephrostomy tube, ureteral avulsion, MI, CVA, PE and the inherent risks of general anesthesia.   - The patient would like to proceed with right PCNL. - Urinalysis, Routine w reflex microscopic   No follow-ups on file.  Wilkie Aye, MD  [facility] [place]

## 2023-05-13 ENCOUNTER — Other Ambulatory Visit: Payer: Self-pay

## 2023-05-13 DIAGNOSIS — N200 Calculus of kidney: Secondary | ICD-10-CM

## 2023-05-15 ENCOUNTER — Emergency Department (HOSPITAL_COMMUNITY)

## 2023-05-15 ENCOUNTER — Emergency Department (HOSPITAL_COMMUNITY)
Admission: EM | Admit: 2023-05-15 | Discharge: 2023-05-15 | Disposition: A | Discharge status: Home / Self Care | Attending: Emergency Medicine | Admitting: Emergency Medicine

## 2023-05-15 ENCOUNTER — Other Ambulatory Visit: Payer: Self-pay

## 2023-05-15 ENCOUNTER — Encounter (HOSPITAL_COMMUNITY): Payer: Self-pay

## 2023-05-15 DIAGNOSIS — I10 Essential (primary) hypertension: Secondary | ICD-10-CM | POA: Diagnosis not present

## 2023-05-15 DIAGNOSIS — M7989 Other specified soft tissue disorders: Secondary | ICD-10-CM | POA: Insufficient documentation

## 2023-05-15 DIAGNOSIS — R600 Localized edema: Secondary | ICD-10-CM | POA: Diagnosis present

## 2023-05-15 DIAGNOSIS — Z79899 Other long term (current) drug therapy: Secondary | ICD-10-CM | POA: Insufficient documentation

## 2023-05-15 LAB — CBC WITH DIFFERENTIAL/PLATELET
Abs Immature Granulocytes: 0.01 10*3/uL (ref 0.00–0.07)
Basophils Absolute: 0 10*3/uL (ref 0.0–0.1)
Basophils Relative: 1 %
Eosinophils Absolute: 0.3 10*3/uL (ref 0.0–0.5)
Eosinophils Relative: 7 %
HCT: 39.3 % (ref 36.0–46.0)
Hemoglobin: 12.7 g/dL (ref 12.0–15.0)
Immature Granulocytes: 0 %
Lymphocytes Relative: 24 %
Lymphs Abs: 0.9 10*3/uL (ref 0.7–4.0)
MCH: 28.2 pg (ref 26.0–34.0)
MCHC: 32.3 g/dL (ref 30.0–36.0)
MCV: 87.3 fL (ref 80.0–100.0)
Monocytes Absolute: 0.2 10*3/uL (ref 0.1–1.0)
Monocytes Relative: 5 %
Neutro Abs: 2.4 10*3/uL (ref 1.7–7.7)
Neutrophils Relative %: 63 %
Platelets: 162 10*3/uL (ref 150–400)
RBC: 4.5 MIL/uL (ref 3.87–5.11)
RDW: 13.5 % (ref 11.5–15.5)
WBC: 3.8 10*3/uL — ABNORMAL LOW (ref 4.0–10.5)
nRBC: 0 % (ref 0.0–0.2)

## 2023-05-15 LAB — BASIC METABOLIC PANEL WITH GFR
Anion gap: 10 (ref 5–15)
BUN: 21 mg/dL — ABNORMAL HIGH (ref 6–20)
CO2: 24 mmol/L (ref 22–32)
Calcium: 9.1 mg/dL (ref 8.9–10.3)
Chloride: 101 mmol/L (ref 98–111)
Creatinine, Ser: 1.18 mg/dL — ABNORMAL HIGH (ref 0.44–1.00)
GFR, Estimated: 60 mL/min — ABNORMAL LOW (ref >60)
Glucose, Bld: 148 mg/dL — ABNORMAL HIGH (ref 70–99)
Potassium: 4.5 mmol/L (ref 3.5–5.1)
Sodium: 135 mmol/L (ref 135–145)

## 2023-05-15 NOTE — ED Triage Notes (Signed)
 Pt sent by after hours from the doctor for blood work and to r/o blood clot. Pt has left leg swelling and was just released from the hospital for kidney stone and edema.

## 2023-05-15 NOTE — ED Provider Notes (Signed)
 [place] EMERGENCY DEPARTMENT AT [facility] Provider Note   CSN: [id number] Arrival date & time: 05/15/23  0847     History  Chief Complaint  Patient presents with   Leg Swelling    Angelica Lopez is a 43 y.o. female.  HPI     Angelica Lopez is a 43 y.o. female past medical history of hypertension anxiety and Scheuermann's disease who presents to the Emergency Department complaining of left lower leg swelling that she noticed yesterday.  She was recently hospitalized for kidney stone on 05/07/2023, discharged the following day.  States she believes her blood pressure was low during hospitalization, was advised to hold her lisinopril, did not take her medication for 2 days.  She started back on her medicine yesterday.  Noticed the swelling in her left lower leg.  She denies having any pain or redness of her leg.  No pain upon standing.  Denies injury to her leg. She also denies any chest pain or shortness of breath.  No history of prior DVTs.  Home Medications Prior to Admission medications   Medication Sig Start Date End Date Taking? Authorizing Provider  Buprenorphine HCl-Naloxone HCl 8-2 MG FILM Place 3 Film under the tongue daily.    [provider]  buPROPion (WELLBUTRIN XL) 300 MG 24 hr tablet Take 300 mg by mouth daily.    [provider]  cetirizine (ZYRTEC ALLERGY) 10 MG tablet Take 1 tablet (10 mg total) by mouth daily. 11/03/19   Avegno, Zachery Dakins, FNP  clonazePAM (KLONOPIN) 0.5 MG tablet Take 1 tablet (0.5 mg total) by mouth every other day as needed for up to 4 days for anxiety. Patient taking differently: Take 0.5 mg by mouth 2 (two) times daily as needed for anxiety. 11/16/22 05/07/23  Neysa Hotter, MD  hydrOXYzine (ATARAX) 25 MG tablet Take 25 mg by mouth daily as needed for anxiety or itching.    [provider]  ketorolac (TORADOL) 10 MG tablet Take 1 tablet (10 mg total) by mouth every 6 (six) hours as needed for moderate pain  (pain score 4-6) or severe pain (pain score 7-10). 05/08/23   Tyrone Nine, MD  levonorgestrel (MIRENA) 20 MCG/24HR IUD 1 each by Intrauterine route once.    [provider]  lisinopril (ZESTRIL) 20 MG tablet Take 1 tablet (20 mg total) by mouth daily. Hold until follow up 05/08/23   Tyrone Nine, MD  sertraline (ZOLOFT) 100 MG tablet Take 1 tablet (100 mg total) by mouth daily. Patient taking differently: Take 200 mg by mouth daily. 03/23/22 05/07/23  Neysa Hotter, MD  sulfamethoxazole-trimethoprim (BACTRIM DS) 800-160 MG tablet Take 1 tablet by mouth 2 (two) times daily for 10 days. 05/08/23 05/18/23  Tyrone Nine, MD  traZODone (DESYREL) 100 MG tablet Take 100 mg by mouth at bedtime. 05/03/23   [provider]      Allergies    Codeine    Review of Systems   Review of Systems  Constitutional:  Negative for appetite change, chills and fever.  Respiratory:  Negative for cough.   Cardiovascular:  Negative for chest pain.  Gastrointestinal:  Negative for abdominal pain, nausea and vomiting.  Genitourinary:  Negative for difficulty urinating and dysuria.  Musculoskeletal:  Negative for arthralgias.       Swelling left lower leg  Skin:  Negative for color change, rash and wound.  Neurological:  Negative for dizziness and weakness.    Physical Exam Updated Vital Signs  BP (!) 146/85 (BP Location: Right Arm)   Pulse 71   Temp 98.2 F (36.8 C) (Oral)   Resp 16   Ht 5\' 1"  (1.549 m)   Wt 79.4 kg   SpO2 98%   BMI 33.07 kg/m  Physical Exam Vitals and nursing note reviewed.  Constitutional:      General: She is not in acute distress.    Appearance: Normal appearance. She is not ill-appearing or toxic-appearing.  Cardiovascular:     Rate and Rhythm: Normal rate and regular rhythm.     Pulses: Normal pulses.  Pulmonary:     Effort: Pulmonary effort is normal.  Musculoskeletal:        General: Tenderness present. No deformity or signs of injury. Normal range of motion.      Right lower leg: No edema.     Left lower leg: Tenderness present. Edema present.     Comments: Mild edema noted to the left lower leg.  No excessive warmth, wounds, or erythema.  No pain with dorsi or plantar flexion  Skin:    General: Skin is warm.     Capillary Refill: Capillary refill takes less than 2 seconds.  Neurological:     Mental Status: She is alert.     ED Results / Procedures / Treatments   Labs (all labs ordered are listed, but only abnormal results are displayed) Labs Reviewed  CBC WITH DIFFERENTIAL/PLATELET - Abnormal; Notable for the following components:      Result Value   WBC 3.8 (*)    All other components within normal limits  BASIC METABOLIC PANEL WITH GFR - Abnormal; Notable for the following components:   Glucose, Bld 148 (*)    BUN 21 (*)    Creatinine, Ser 1.18 (*)    GFR, Estimated 60 (*)    All other components within normal limits    EKG None  Radiology US Venous Img Lower Unilateral Left Result Date: 05/15/2023 CLINICAL DATA:  1 day history of left leg swelling. EXAM: LEFT LOWER EXTREMITY VENOUS DOPPLER ULTRASOUND TECHNIQUE: Gray-scale sonography with compression, as well as color and duplex ultrasound, were performed to evaluate the deep venous system(s) from the level of the common femoral vein through the popliteal and proximal calf veins. COMPARISON:  None Available. FINDINGS: VENOUS Normal compressibility of the common femoral, superficial femoral, and popliteal veins, as well as the visualized calf veins. Visualized portions of profunda femoral vein and great saphenous vein unremarkable. No filling defects to suggest DVT on grayscale or color Doppler imaging. Doppler waveforms show normal direction of venous flow, normal respiratory plasticity and response to augmentation. Limited views of the contralateral common femoral vein are unremarkable. OTHER None. Limitations: none IMPRESSION: Negative. Electronically Signed   By: Kennith Center M.D.    On: 05/15/2023 09:53    Procedures Procedures    Medications Ordered in ED Medications - No data to display  ED Course/ Medical Decision Making/ A&P                                 Medical Decision Making Patient here for evaluation of left lower leg swelling after recent hospitalization.  She was discharged from hospital 1 day following her admission.  Noticed swelling of her leg yesterday.  No history of DVTs.  She denies any chest pain or shortness of breath.  No pain upon standing mild tenderness to her calf without erythema.  Minimal edema noted to the left lower extremity.  I suspect this is mild peripheral edema, DVT certainly also of consideration.  No excessive warmth erythema fever or chills to suggest a cellulitis.  Do not appreciate any skin changes of the extremity.  Amount and/or Complexity of Data Reviewed Labs: ordered.    Details: Labs show mildly elevated serum creatinine.  She has recently been treated for large kidney stone, creatinine improving from late March Radiology: ordered.    Details: Ultrasound of the left lower extremity without evidence of DVT Discussion of management or test interpretation with external provider(s): Discussed creatinine results with patient.  Advised to continue to drink plenty of water.  I have recommended repeat creatinine testing in 1 to 2 weeks by PCP.  Ultrasound without evidence of DVT.  Suspect this is peripheral edema.  Agreeable to symptomatic treatment with elevation.  Appears appropriate for discharge home will follow-up with PCP           Final Clinical Impression(s) / ED Diagnoses Final diagnoses:  Peripheral edema    Rx / DC Orders ED Discharge Orders     None         Pauline Aus, PA-C 05/15/23 1552    Eber Hong, MD 05/16/23 [phone]

## 2023-05-15 NOTE — Discharge Instructions (Signed)
 Your ultrasound today did not show evidence of a blood clot.  I recommend that you elevate your feet when possible to help with the swelling.  Also, as discussed your kidney functions are still slightly elevated.  I recommend that you continue to drink plenty of water and have your kidney functions rechecked by your primary care provider in 1 to 2 weeks.  Return to the emergency department if you develop any new or worsening symptoms.

## 2023-05-17 ENCOUNTER — Telehealth: Payer: Self-pay

## 2023-05-17 NOTE — Telephone Encounter (Signed)
 I called patient to follow up on a triage call received regarding swelling in her L left.  She states she was seen at the ER and the swelling has since improved, patient denies any other issues at this time.

## 2023-06-03 ENCOUNTER — Encounter: Payer: Self-pay | Admitting: Urology

## 2023-06-04 NOTE — Telephone Encounter (Signed)
 Spoke with patient verbally with MD response she is ok to keep scheduled trip.  FMLA paper work will be faxed back today.

## 2023-06-07 NOTE — Patient Instructions (Signed)
 Angelica Lopez  06/07/2023     @PREFPERIOPPHARMACY @   Your procedure is scheduled on 06/10/2023.   Report to [facility] at 6:00 A.M.   Call this number if you have problems the morning of surgery:  [phone]  If you experience any cold or flu symptoms such as cough, fever, chills, shortness of breath, etc. between now and your scheduled surgery, please notify us  at the above number.   Remember:   Do not eat  after midnight.  You may drink clear liquids until 3:30 AM .  Clear liquids allowed are:                    Water, Carbonated beverages (diabetics please choose diet or no sugar options), and Clear Sports drink (No red color; diabetics please choose diet or no sugar options)    Take these medicines the morning of surgery with A SIP OF WATER : Wellbutrin  Zyrtec  Klonopin  Zoloft     Do not wear jewelry, make-up or nail polish, including gel polish,  artificial nails, or any other type of covering on natural nails (fingers and  toes).  Do not wear lotions, powders, or perfumes, or deodorant.  Do not shave 48 hours prior to surgery.  Men may shave face and neck.  Do not bring valuables to the hospital.  [facility] is not responsible for any belongings or valuables.  Contacts, dentures or bridgework may not be worn into surgery.  Leave your suitcase in the car.  After surgery it may be brought to your room.  For patients admitted to the hospital, discharge time will be determined by your treatment team.  Patients discharged the day of surgery will not be allowed to drive home.   Name and phone number of your driver:   Family  Special instructions:  N/A  Please read over the following fact sheets that you were given. Care and Recovery After Surgery   Percutaneous Nephrolithotomy Percutaneous nephrolithotomy is a procedure to remove kidney stones. Kidney stones are rock-like masses that form inside the kidneys. You may need this procedure if: You have kidney stones that  are bigger than 2 cm (0.78 inch) wide. Your kidney stones have an odd shape to them. Other treatments have not worked. You have an infection brought on by the kidney stones. Tell a health care provider about: Any allergies you have. All medicines you are taking, including vitamins, herbs, eye drops, creams, and over-the-counter medicines. Any problems you or family members have had with anesthesia. Any bleeding problems you have. Any surgeries you have had. Any medical conditions you have. Whether you are pregnant or may be pregnant. What are the risks? Your health care provider will talk with you about risks. These may include: Infection. Bleeding. This may include blood in your pee (urine). Allergic reactions to medicines. Damage to other structures or organs. Damage to the kidney. Holes in the kidney. In most cases, these holes heal on their own. Numbness or tingling. Not being able to remove all the stones. If this happens, you may need more treatment. What happens before the procedure? When to stop eating and drinking Follow instructions from your provider about what you may eat and drink. These may include: 8 hours before your procedure Stop eating most foods. Do not eat meat, fried foods, or fatty foods. Eat only light foods, such as toast or crackers. All liquids are okay except energy drinks and alcohol. 6 hours before your procedure Stop eating. Drink  only clear liquids, such as water, clear fruit juice, black coffee, plain tea, and sports drinks. Do not drink energy drinks or alcohol. 2 hours before your procedure Stop drinking all liquids. You may be allowed to take medicines with small sips of water. If you do not follow your provider's instructions, your procedure may be delayed or canceled. Medicines Ask your provider about: Changing or stopping your regular medicines. These include any diabetes medicines or blood thinners you take. Taking medicines such as  aspirin and ibuprofen . These medicines can thin your blood. Do not take them unless your provider tells you to. Taking over-the-counter medicines, vitamins, herbs, and supplements. Tests You may have tests done. These may include: Blood and pee tests. An electrocardiogram (EKG). This checks how well your heart is working. Imaging studies. These may include an ultrasound or CT scan. They can help find: The size and number (stone burden) of the kidney stones. Where the kidney stones are. Surgery safety Ask your provider: How your surgery site will be marked. What steps will be taken to help prevent infection. These steps may include: Removing hair at the surgery site. Washing skin with a soap that kills germs. Receiving antibiotics. General instructions Plan to have a responsible adult: Take you home from the hospital. You will not be allowed to drive. Care for you for the time you are told. What happens during the procedure?  An IV will be inserted into one of your veins. You will be given: A sedative. This helps you relax. Anesthesia. This keeps you from feeling pain. It will numb certain areas make you fall asleep for surgery. A soft tube (catheter) will be put in your bladder. This will drain pee out of your body during and after the procedure. Your surgeon will make a small incision in your lower back. A tube will be put through the incision into your kidney. Each kidney stone will be removed through this tube. Larger stones may need to be broken up with a beam of light (laser) or other tools. After all of the stones have been removed, your provider may put in another tube to help drain pee. This may be: A stent. This is put into the tube that connects the bladder to the kidney (ureter). It helps drain pee from your kidney to your bladder. A nephrostomy tube. This is put in your kidney. It comes out through the incision in your lower back. It helps drain pee or any fluid that  builds up while your kidney heals. The incision may be closed with stitches (sutures). A bandage (dressing) will be placed over the incision area. The procedure may vary among providers and hospitals. What happens after the procedure? Your blood pressure, heart rate, breathing rate, and blood oxygen level will be monitored until you leave the hospital or clinic. Any tubes will be removed after 1-2 days if there is only a small amount of blood in your pee. This information is not intended to replace advice given to you by your health care provider. Make sure you discuss any questions you have with your health care provider. Document Revised: 09/25/2021 Document Reviewed: 09/25/2021 Elsevier Patient Education  2024 [employer] Inc.  General Anesthesia, Adult General anesthesia is the use of medicine to make you fall asleep (unconscious) for a medical procedure. General anesthesia must be used for certain procedures. It is often recommended for surgery or procedures that: Last a long time. Require you to be still or in an unusual position. Are major  and can cause blood loss. Affect your breathing. The medicines used for general anesthesia are called general anesthetics. During general anesthesia, these medicines are given along with medicines that: Prevent pain. Control your blood pressure. Relax your muscles. Prevent nausea and vomiting after the procedure. Tell a health care provider about: Any allergies you have. All medicines you are taking, including vitamins, herbs, eye drops, creams, and over-the-counter medicines. Your history of any: Medical conditions you have, including: High blood pressure. Bleeding problems. Diabetes. Heart or lung conditions, such as: Heart failure. Sleep apnea. Asthma. Chronic obstructive pulmonary disease (COPD). Current or recent illnesses, such as: Upper respiratory, chest, or ear infections. Cough or fever. Tobacco or drug use, including marijuana  or alcohol use. Depression or anxiety. Surgeries and types of anesthetics you have had. Problems you or family members have had with anesthetic medicines. Whether you are pregnant or may be pregnant. Whether you have any chipped or loose teeth, dentures, caps, bridgework, or issues with your mouth, swallowing, or choking. What are the risks? Your health care provider will talk with you about risks. These may include: Allergic reaction to the medicines. Lung and heart problems. Inhaling food or liquid from the stomach into the lungs (aspiration). Nerve injury. Injury to the lips, mouth, teeth, or gums. Stroke. Waking up during your procedure and being unable to move. This is rare. These problems are more likely to develop if you are having a major surgery or if you have an advanced or serious medical condition. You can prevent some of these complications by answering all of your health care provider's questions thoroughly and by following all instructions before your procedure. General anesthesia can cause side effects, including: Nausea or vomiting. A sore throat or hoarseness from the breathing tube. Wheezing or coughing. Shaking chills or feeling cold. Body aches. Sleepiness. Confusion, agitation (delirium), or anxiety. What happens before the procedure? When to stop eating and drinking Follow instructions from your health care provider about what you may eat and drink before your procedure. If you do not follow your health care provider's instructions, your procedure may be delayed or canceled. Medicines Ask your health care provider about: Changing or stopping your regular medicines. These include any diabetes medicines or blood thinners you take. Taking medicines such as aspirin and ibuprofen . These medicines can thin your blood. Do not take them unless your health care provider tells you to. Taking over-the-counter medicines, vitamins, herbs, and supplements. General  instructions Do not use any products that contain nicotine  or tobacco for at least 4 weeks before the procedure. These products include cigarettes, chewing tobacco, and vaping devices, such as e-cigarettes. If you need help quitting, ask your health care provider. If you brush your teeth on the morning of the procedure, make sure to spit out all of the water and toothpaste. If told by your health care provider, bring your sleep apnea device with you to surgery (if applicable). If you will be going home right after the procedure, plan to have a responsible adult: Take you home from the hospital or clinic. You will not be allowed to drive. Care for you for the time you are told. What happens during the procedure?  An IV will be inserted into one of your veins. You will be given one or more of the following through a face mask or IV: A sedative. This helps you relax. Anesthesia. This will: Numb certain areas of your body. Make you fall asleep for surgery. After you are unconscious, a breathing  tube may be inserted down your throat to help you breathe. This will be removed before you wake up. An anesthesia provider, such as an anesthesiologist, will stay with you throughout your procedure. The anesthesia provider will: Keep you comfortable and safe by continuing to give you medicines and adjusting the amount of medicine that you get. Monitor your blood pressure, heart rate, and oxygen levels to make sure that the anesthetics do not cause any problems. The procedure may vary among health care providers and hospitals. What happens after the procedure? Your blood pressure, temperature, heart rate, breathing rate, and blood oxygen level will be monitored until you leave the hospital or clinic. You will wake up in a recovery area. You may wake up slowly. You may be given medicine to help you with pain, nausea, or any other side effects from the anesthesia. Summary General anesthesia is the use of  medicine to make you fall asleep (unconscious) for a medical procedure. Follow your health care provider's instructions about when to stop eating, drinking, or taking certain medicines before your procedure. Plan to have a responsible adult take you home from the hospital or clinic. This information is not intended to replace advice given to you by your health care provider. Make sure you discuss any questions you have with your health care provider. Document Revised: 04/24/2021 Document Reviewed: 04/24/2021 Elsevier Patient Education  2024 Elsevier Inc.  How to Use Chlorhexidine at Home in the Shower Chlorhexidine gluconate (CHG) is a germ-killing (antiseptic) wash that's used to clean the skin. It can get rid of the germs that normally live on the skin and can keep them away for about 24 hours. If you're having surgery, you may be told to shower with CHG at home the night before surgery. This can help lower your risk for infection. To use CHG wash in the shower, follow the steps below. Supplies needed: CHG body wash. Clean washcloth. Clean towel. How to use CHG in the shower Follow these steps unless you're told to use CHG in a different way: Start the shower. Use your normal soap and shampoo to wash your face and hair. Turn off the shower or move out of the shower stream. Pour CHG onto a clean washcloth. Do not use any type of brush or rough sponge. Start at your neck, washing your body down to your toes. Make sure you: Wash the part of your body where the surgery will be done for at least 1 minute. Do not scrub. Do not use CHG on your head or face unless your health care provider tells you to. If it gets into your ears or eyes, rinse them well with water. Do not wash your genitals with CHG. Wash your back and under your arms. Make sure to wash skin folds. Let the CHG sit on your skin for 1-2 minutes or as long as told. Rinse your entire body in the shower, including all body creases  and folds. Turn off the shower. Dry off with a clean towel. Do not put anything on your skin afterward, such as powder, lotion, or perfume. Put on clean clothes or pajamas. If it's the night before surgery, sleep in clean sheets. General tips Use CHG only as told, and follow the instructions on the label. Use the full amount of CHG as told. This is often one bottle. Do not smoke and stay away from flames after using CHG. Your skin may feel sticky after using CHG. This is normal. The sticky feeling will  go away as the CHG dries. Do not use CHG: If you have a chlorhexidine allergy or have reacted to chlorhexidine in the past. On open wounds or areas of skin that have broken skin, cuts, or scrapes. On babies younger than 43 months of age. Contact a health care provider if: You have questions about using CHG. Your skin gets irritated or itchy. You have a rash after using CHG. You swallow any CHG. Call your local poison control center [phone] in the [place] Your eyes itch badly, or they become very red or swollen. Your hearing changes. You have trouble seeing. If you can't reach your provider, go to an urgent care or emergency room. Do not drive yourself. Get help right away if: You have swelling or tingling in your mouth or throat. You make high-pitched whistling sounds when you breathe, most often when you breathe out (wheeze). You have trouble breathing. These symptoms may be an emergency. Call 911 right away. Do not wait to see if the symptoms will go away. Do not drive yourself to the hospital. This information is not intended to replace advice given to you by your health care provider. Make sure you discuss any questions you have with your health care provider. Document Revised: 08/11/2022 Document Reviewed: 08/07/2021 Elsevier Patient Education  2024 [employer].

## 2023-06-08 ENCOUNTER — Encounter (HOSPITAL_COMMUNITY)
Admission: RE | Admit: 2023-06-08 | Discharge: 2023-06-08 | Disposition: A | Discharge status: Home / Self Care | Source: Ambulatory Visit | Attending: Urology | Admitting: Urology

## 2023-06-08 ENCOUNTER — Other Ambulatory Visit: Payer: Self-pay | Admitting: Radiology

## 2023-06-08 ENCOUNTER — Encounter (HOSPITAL_COMMUNITY): Payer: Self-pay

## 2023-06-08 VITALS — BP 98/69 | HR 95 | Temp 98.0°F | Resp 18 | Ht 61.0 in | Wt 175.0 lb

## 2023-06-08 DIAGNOSIS — N200 Calculus of kidney: Secondary | ICD-10-CM

## 2023-06-08 DIAGNOSIS — Z01818 Encounter for other preprocedural examination: Secondary | ICD-10-CM | POA: Insufficient documentation

## 2023-06-08 DIAGNOSIS — R9431 Abnormal electrocardiogram [ECG] [EKG]: Secondary | ICD-10-CM | POA: Diagnosis not present

## 2023-06-08 DIAGNOSIS — I10 Essential (primary) hypertension: Secondary | ICD-10-CM | POA: Diagnosis not present

## 2023-06-08 LAB — POCT PREGNANCY, URINE: Preg Test, Ur: NEGATIVE

## 2023-06-08 LAB — CBC
HCT: 35.9 % — ABNORMAL LOW (ref 36.0–46.0)
Hemoglobin: 11.6 g/dL — ABNORMAL LOW (ref 12.0–15.0)
MCH: 28.1 pg (ref 26.0–34.0)
MCHC: 32.3 g/dL (ref 30.0–36.0)
MCV: 86.9 fL (ref 80.0–100.0)
Platelets: 133 10*3/uL — ABNORMAL LOW (ref 150–400)
RBC: 4.13 MIL/uL (ref 3.87–5.11)
RDW: 13.1 % (ref 11.5–15.5)
WBC: 5.1 10*3/uL (ref 4.0–10.5)
nRBC: 0 % (ref 0.0–0.2)

## 2023-06-08 LAB — RAPID URINE DRUG SCREEN, HOSP PERFORMED
Amphetamines: NOT DETECTED
Barbiturates: NOT DETECTED
Benzodiazepines: POSITIVE — AB
Cocaine: POSITIVE — AB
Opiates: NOT DETECTED
Tetrahydrocannabinol: POSITIVE — AB

## 2023-06-08 LAB — PROTIME-INR
INR: 1 (ref 0.8–1.2)
Prothrombin Time: 13.4 s (ref 11.4–15.2)

## 2023-06-09 ENCOUNTER — Inpatient Hospital Stay (HOSPITAL_COMMUNITY)
Admission: RE | Admit: 2023-06-09 | Discharge: 2023-06-09 | Disposition: A | Discharge status: Home / Self Care | Source: Ambulatory Visit | Attending: Urology | Admitting: Urology

## 2023-06-09 ENCOUNTER — Ambulatory Visit (HOSPITAL_COMMUNITY)

## 2023-06-09 ENCOUNTER — Telehealth: Payer: Self-pay

## 2023-06-09 NOTE — Telephone Encounter (Signed)
 I called patient to inform her that her surgery will have to be rescheduled due to positive drug screen.  She is concerned because her drug screen from [facility] shows she was positive for cocaine however, she had a drug screen done at [facility] earlier that day that was negative for cocaine.  She did state that she smoked marijuana in between the two drug test so she is worried that her marijuana was laced with cocaine.  Verbal from Dr. Claretta Croft that could be possible.  Patient made aware of MD response via mychart.

## 2023-06-15 ENCOUNTER — Encounter: Admitting: Urology

## 2023-06-23 ENCOUNTER — Encounter: Admitting: Urology

## 2023-07-02 ENCOUNTER — Telehealth: Payer: Self-pay | Admitting: Urology

## 2023-07-02 NOTE — Telephone Encounter (Signed)
 States her leave of abscence has been cxld. She has to start a new claim. She would like a call from [facility] on what she needs to do.

## 2023-07-05 ENCOUNTER — Other Ambulatory Visit: Payer: Self-pay | Admitting: Radiology

## 2023-07-05 DIAGNOSIS — N200 Calculus of kidney: Secondary | ICD-10-CM

## 2023-07-06 ENCOUNTER — Other Ambulatory Visit (HOSPITAL_COMMUNITY)
Admission: RE | Admit: 2023-07-06 | Discharge: 2023-07-06 | Disposition: A | Discharge status: Home / Self Care | Source: Ambulatory Visit | Attending: Urology | Admitting: Urology

## 2023-07-06 ENCOUNTER — Other Ambulatory Visit: Payer: Self-pay

## 2023-07-06 ENCOUNTER — Encounter (HOSPITAL_COMMUNITY): Payer: Self-pay

## 2023-07-06 ENCOUNTER — Encounter (HOSPITAL_COMMUNITY)
Admission: RE | Admit: 2023-07-06 | Discharge: 2023-07-06 | Disposition: A | Discharge status: Home / Self Care | Source: Ambulatory Visit | Attending: Urology | Admitting: Urology

## 2023-07-06 DIAGNOSIS — Z01812 Encounter for preprocedural laboratory examination: Secondary | ICD-10-CM | POA: Diagnosis present

## 2023-07-06 DIAGNOSIS — Z01818 Encounter for other preprocedural examination: Secondary | ICD-10-CM

## 2023-07-06 LAB — RAPID URINE DRUG SCREEN, HOSP PERFORMED
Amphetamines: NOT DETECTED
Barbiturates: NOT DETECTED
Benzodiazepines: POSITIVE — AB
Cocaine: NOT DETECTED
Opiates: NOT DETECTED
Tetrahydrocannabinol: NOT DETECTED

## 2023-07-06 NOTE — Addendum Note (Signed)
 Encounter addended by: Jacari Kirsten K, PA-C on: 07/06/2023 5:05 PM  Actions taken: Delete clinical note

## 2023-07-06 NOTE — H&P (Signed)
 Chief Complaint: Right renal stone with moderate hydronephrosis; referred for right percutaneous nephrostomy/ nephroureteral catheter placement prior to nephrolithotomy on 5/29 at [facility]   Referring Provider(s): McKenzie,P   Supervising Physician: Elene Griffes  Patient Status: [facility] - Out-pt  History of Present Illness: Angelica Lopez is a 43 y.o. 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 She is scheduled today for right percutaneous nephrostomy/nephroureteral catheter placement prior to nephrolithotomy at [facility] on 5/29.   *** Patient is Full Code  Past Medical History:  Diagnosis Date   Anxiety    Back pain    Depression    Hypertension    Kyphosis    Scheuermann's disease     Past Surgical History:  Procedure Laterality Date   CESAREAN SECTION     TONSILLECTOMY      Allergies: Codeine   Medications: Prior to Admission medications   Medication Sig Start Date End Date Taking? Authorizing Provider  Buprenorphine  HCl-Naloxone  HCl 8-2 MG FILM Place 1 Film under the tongue in the morning, at noon, and at bedtime.    [provider]  buPROPion  (WELLBUTRIN  XL) 300 MG 24 hr tablet Take 300 mg by mouth in the morning.    [provider]  cetirizine  (ZYRTEC  ALLERGY) 10 MG tablet Take 1 tablet (10 mg total) by mouth daily. 11/03/19   Avegno, Komlanvi S, FNP  clonazePAM  (KLONOPIN ) 0.5 MG tablet Take 0.5 mg by mouth in the morning and at bedtime.    [provider]  ibuprofen  (ADVIL ) 200 MG tablet Take 800 mg by mouth every 8 (eight) hours as needed (pain.).    [provider]  ketorolac  (TORADOL ) 10 MG tablet Take 1 tablet (10 mg total) by mouth every 6 (six) hours as needed for moderate pain (pain score  4-6) or severe pain (pain score 7-10). 05/08/23   Wynetta Heckle, MD  levonorgestrel  (MIRENA ) 20 MCG/24HR IUD 1 each by Intrauterine route once.    [provider]  lisinopril  (ZESTRIL ) 20 MG tablet Take 1 tablet (20 mg total) by mouth daily. Hold until follow up Patient taking differently: Take 10 mg by mouth daily. 05/08/23   Wynetta Heckle, MD  Probiotic Product (PROBIOTIC PO) Take 1 capsule by mouth in the morning. [place] Women's Probiotic Dietary Supplement    [provider]  sertraline  (ZOLOFT ) 100 MG tablet Take 200 mg by mouth in the morning.    [provider]  traZODone  (DESYREL ) 100 MG tablet Take 100 mg by mouth at bedtime. 05/03/23   [provider]     Family History  Problem Relation Age of Onset   Heart disease Maternal Grandfather        has had 3 open heart surgeries   Hypertension Father    Diabetes Father    Anxiety disorder Father    Depression Father    Anxiety disorder Mother    Depression Mother    Febrile seizures Daughter    Febrile seizures Daughter    Febrile seizures Daughter     Social History   Socioeconomic History   Marital status: Legally Separated    Spouse name: Not on file   Number of children: 3   Years of education: Not on file   Highest education level: Not on file  Occupational History  Not on file  Tobacco Use   Smoking status: Every Day    Current packs/day: 2.00    Average packs/day: 2.0 packs/day for 15.0 years (30.0 ttl pk-yrs)    Types: Cigarettes   Smokeless tobacco: Never  Vaping Use   Vaping status: Never Used  Substance and Sexual Activity   Alcohol use: No   Drug use: Yes    Types: Marijuana    Comment: twice a week   Sexual activity: Not Currently    Birth control/protection: I.U.D.  Other Topics Concern   Not on file  Social History Narrative   Not on file   Social Drivers of Health   Financial Resource Strain: Low Risk  (03/08/2023)   Received from [facility]    Overall Financial Resource Strain (CARDIA)    Difficulty of Paying Living Expenses: Not hard at all  Food Insecurity: No Food Insecurity (05/07/2023)   Hunger Vital Sign    Worried About Running Out of Food in the Last Year: Never true    Ran Out of Food in the Last Year: Never true  Transportation Needs: No Transportation Needs (05/07/2023)   PRAPARE - Administrator, Civil Service (Medical): No    Lack of Transportation (Non-Medical): No  Physical Activity: Unknown (03/20/2022)   Received from [facility], [facility]   Exercise Vital Sign    Days of Exercise per Week: 0 days    Minutes of Exercise per Session: Not on file  Recent Concern: Physical Activity - Inactive (03/20/2022)   Received from [facility]   Exercise Vital Sign    Days of Exercise per Week: 0 days    Minutes of Exercise per Session: 0 min  Stress: Stress Concern Present (03/20/2022)   Received from [facility] - Occupational Stress Questionnaire    Feeling of Stress : Very much  Social Connections: Socially Isolated (03/20/2022)   Received from [facility], [facility]   Social Network    How would you rate your social network (family, work, friends)?: Little participation, lonely and socially isolated       Review of Systems:   Vital Signs: LMP  (LMP Unknown)   Advance Care Plan: no documents on file  Physical Exam:   Imaging: No results found.  Labs:  CBC: Recent Labs    05/07/23 1357 05/08/23 0730 05/15/23 0907 06/08/23 1533  WBC 6.9 5.3 3.8* 5.1  HGB 13.7 10.5* 12.7 11.6*  HCT 43.4 32.9* 39.3 35.9*  PLT 180 126* 162 133*    COAGS: Recent Labs    06/08/23 1533  INR 1.0    BMP: Recent Labs    05/07/23 1357 05/08/23 0407 05/15/23 0907  NA 136 136 135  K 4.5 3.4* 4.5  CL 102 106 101  CO2 24 25 24   GLUCOSE 125* 124* 148*  BUN 21* 19 21*  CALCIUM 9.4 8.2* 9.1  CREATININE 1.61* 1.09* 1.18*  GFRNONAA  41* >60 60*    LIVER FUNCTION TESTS: Recent Labs    05/07/23 1357  BILITOT 0.8  AST 30  ALT 16  ALKPHOS 61  PROT 7.8  ALBUMIN 4.2    TUMOR MARKERS: No results for input(s): "AFPTM", "CEA", "CA199", "CHROMGRNA" in the last 8760 hours.  Assessment and Plan: 43 y.o. female smoker with past medical history significant for anxiety, depression, hypertension, kyphosis/ Scheuermann's disease and nephrolithiasis who presents now with a 1.4 cm right UPJ/renal pelvis calculus-moderate right hydronephrosis,  mild renal insufficiency and intermittent right flank pain. She is scheduled today for right percutaneous nephrostomy/nephroureteral catheter placement prior to nephrolithotomy at [facility] on 5/29. Risks and benefits of right PCN placement was discussed with the patient including, but not limited to, infection, bleeding, significant bleeding causing loss or decrease in renal function or damage to adjacent structures.    All of the patient's questions were answered, patient is agreeable to proceed.   Consent signed and in chart.   Thank you for allowing our service to participate in Angelica Lopez 's care.  Electronically Signed: D. Honore Lux, PA-C   07/06/2023, 5:00 PM      I spent a total of  30 minutes   in face to face in clinical consultation, greater than 50% of which was counseling/coordinating care for  image guided right percutaneous nephrostomy/nephroureteral catheter placement

## 2023-07-06 NOTE — Telephone Encounter (Signed)
 I called patient, she states [employer] should have faxed over new FMLA paper work but she may have given them the wrong fax number.  I informed her that I do not have any new forms at this time.  I provided her with our office fax and requested her follow up with [employer] and have them refax paper work.  Patient voiced understanding.

## 2023-07-07 ENCOUNTER — Ambulatory Visit (HOSPITAL_COMMUNITY)
Admission: RE | Admit: 2023-07-07 | Discharge: 2023-07-07 | Disposition: A | Discharge status: Home / Self Care | Source: Ambulatory Visit | Attending: Urology | Admitting: Urology

## 2023-07-07 ENCOUNTER — Encounter (HOSPITAL_COMMUNITY): Payer: Self-pay

## 2023-07-07 DIAGNOSIS — M4200 Juvenile osteochondrosis of spine, site unspecified: Secondary | ICD-10-CM | POA: Insufficient documentation

## 2023-07-07 DIAGNOSIS — I10 Essential (primary) hypertension: Secondary | ICD-10-CM | POA: Diagnosis not present

## 2023-07-07 DIAGNOSIS — N200 Calculus of kidney: Secondary | ICD-10-CM

## 2023-07-07 DIAGNOSIS — N132 Hydronephrosis with renal and ureteral calculous obstruction: Secondary | ICD-10-CM | POA: Insufficient documentation

## 2023-07-07 DIAGNOSIS — F32A Depression, unspecified: Secondary | ICD-10-CM | POA: Insufficient documentation

## 2023-07-07 DIAGNOSIS — F17210 Nicotine dependence, cigarettes, uncomplicated: Secondary | ICD-10-CM | POA: Insufficient documentation

## 2023-07-07 DIAGNOSIS — F419 Anxiety disorder, unspecified: Secondary | ICD-10-CM | POA: Diagnosis not present

## 2023-07-07 LAB — CBC WITH DIFFERENTIAL/PLATELET
Abs Immature Granulocytes: 0.02 10*3/uL (ref 0.00–0.07)
Basophils Absolute: 0 10*3/uL (ref 0.0–0.1)
Basophils Relative: 1 %
Eosinophils Absolute: 0.2 10*3/uL (ref 0.0–0.5)
Eosinophils Relative: 4 %
HCT: 38.3 % (ref 36.0–46.0)
Hemoglobin: 12.2 g/dL (ref 12.0–15.0)
Immature Granulocytes: 1 %
Lymphocytes Relative: 25 %
Lymphs Abs: 1.1 10*3/uL (ref 0.7–4.0)
MCH: 28 pg (ref 26.0–34.0)
MCHC: 31.9 g/dL (ref 30.0–36.0)
MCV: 88 fL (ref 80.0–100.0)
Monocytes Absolute: 0.2 10*3/uL (ref 0.1–1.0)
Monocytes Relative: 5 %
Neutro Abs: 2.8 10*3/uL (ref 1.7–7.7)
Neutrophils Relative %: 64 %
Platelets: 144 10*3/uL — ABNORMAL LOW (ref 150–400)
RBC: 4.35 MIL/uL (ref 3.87–5.11)
RDW: 13 % (ref 11.5–15.5)
WBC: 4.3 10*3/uL (ref 4.0–10.5)
nRBC: 0 % (ref 0.0–0.2)

## 2023-07-07 LAB — BASIC METABOLIC PANEL WITH GFR
Anion gap: 7 (ref 5–15)
BUN: 22 mg/dL — ABNORMAL HIGH (ref 6–20)
CO2: 25 mmol/L (ref 22–32)
Calcium: 8.8 mg/dL — ABNORMAL LOW (ref 8.9–10.3)
Chloride: 106 mmol/L (ref 98–111)
Creatinine, Ser: 0.78 mg/dL (ref 0.44–1.00)
GFR, Estimated: >60 mL/min (ref >60)
Glucose, Bld: 93 mg/dL (ref 70–99)
Potassium: 4 mmol/L (ref 3.5–5.1)
Sodium: 138 mmol/L (ref 135–145)

## 2023-07-07 LAB — PROTIME-INR
INR: 1 (ref 0.8–1.2)
Prothrombin Time: 13.3 s (ref 11.4–15.2)

## 2023-07-07 MED ORDER — MIDAZOLAM HCL 2 MG/2ML IJ SOLN
INTRAMUSCULAR | Status: AC
Start: 2023-07-07 — End: ?
  Filled 2023-07-07: qty 2

## 2023-07-07 MED ORDER — IOHEXOL 300 MG/ML  SOLN
50.0000 mL | Freq: Once | INTRAMUSCULAR | Status: AC | PRN
  Administered 2023-07-07: 20 mL

## 2023-07-07 MED ORDER — DIPHENHYDRAMINE HCL 50 MG/ML IJ SOLN
INTRAMUSCULAR | Status: AC
  Filled 2023-07-07: qty 1

## 2023-07-07 MED ORDER — LIDOCAINE HCL 1 % IJ SOLN
20.0000 mL | Freq: Once | INTRAMUSCULAR | Status: AC
  Administered 2023-07-07: 20 mL via INTRADERMAL

## 2023-07-07 MED ORDER — LIDOCAINE HCL 1 % IJ SOLN
INTRAMUSCULAR | Status: AC
  Filled 2023-07-07: qty 20

## 2023-07-07 MED ORDER — FENTANYL CITRATE (PF) 100 MCG/2ML IJ SOLN
INTRAMUSCULAR | Status: AC
  Filled 2023-07-07: qty 2

## 2023-07-07 MED ORDER — FENTANYL CITRATE (PF) 100 MCG/2ML IJ SOLN
INTRAMUSCULAR | Status: AC | PRN
  Administered 2023-07-07 (×6): 50 ug via INTRAVENOUS

## 2023-07-07 MED ORDER — MIDAZOLAM HCL 2 MG/2ML IJ SOLN
INTRAMUSCULAR | Status: AC | PRN
  Administered 2023-07-07: 2 mg via INTRAVENOUS
  Administered 2023-07-07 (×4): 1 mg via INTRAVENOUS

## 2023-07-07 MED ORDER — SODIUM CHLORIDE 0.9 % IV SOLN
INTRAVENOUS | Status: AC
  Filled 2023-07-07: qty 20

## 2023-07-07 MED ORDER — DIPHENHYDRAMINE HCL 50 MG/ML IJ SOLN
INTRAMUSCULAR | Status: AC | PRN
  Administered 2023-07-07: 50 mg via INTRAVENOUS

## 2023-07-07 MED ORDER — SODIUM CHLORIDE 0.9 % IV SOLN: INTRAVENOUS | Status: DC

## 2023-07-07 MED ORDER — SODIUM CHLORIDE 0.9 % IV SOLN
2.0000 g | Freq: Once | INTRAVENOUS | Status: AC
  Administered 2023-07-07: 2 g via INTRAVENOUS

## 2023-07-07 MED ORDER — FENTANYL CITRATE (PF) 100 MCG/2ML IJ SOLN
INTRAMUSCULAR | Status: AC
Start: 2023-07-07 — End: ?
  Filled 2023-07-07: qty 2

## 2023-07-07 MED ORDER — MIDAZOLAM HCL 2 MG/2ML IJ SOLN
INTRAMUSCULAR | Status: AC
  Filled 2023-07-07: qty 2

## 2023-07-07 NOTE — Procedures (Signed)
 Interventional Radiology Procedure:   Indications: Right renal calculus  Procedure: Placement of right nephroureteral catheter  Findings: 5 Fr catheter placed via a right lower pole calyx.  Tip in bladder.  Catheter is capped and sutured to skin.   Complications: None     EBL: Minimal  Plan: Keep nephroureteral catheter capped and covered until surgery tomorrow.  Ryeleigh Santore R. Julietta Ogren, MD  Pager: [phone]

## 2023-07-07 NOTE — Sedation Documentation (Signed)
 Pt. Received 50mg  Benadryl 6mg  Versed and 300mcg of Fentanyl pulled in pysix in IR room. 2g of Rocephin  pulled in IR Med room. There were not any medications to be wasted. All medications were used.

## 2023-07-07 NOTE — Sedation Documentation (Signed)
 Pt. Takes Buprenorphine  HCl-Naloxone  Hcl 8-2mg  sublinguil film 3x a day. Pt. Reports taking 1/2 of a film this morning. RN consulted with Anesthesiology and Dr. Julietta Ogren and confirmed that it was okay to conciously sedate pt.normally. Pt. Will receive Benadryl, Versed, and Fentanyl. RN consulted with Jacobo Masters RN to confirm as well.

## 2023-07-08 ENCOUNTER — Encounter (HOSPITAL_COMMUNITY)
Admission: RE | Disposition: A | Discharge status: Home / Self Care | Payer: Self-pay | Source: Home / Self Care | Attending: Urology

## 2023-07-08 ENCOUNTER — Ambulatory Visit (HOSPITAL_BASED_OUTPATIENT_CLINIC_OR_DEPARTMENT_OTHER): Payer: Self-pay | Admitting: Certified Registered"

## 2023-07-08 ENCOUNTER — Ambulatory Visit (HOSPITAL_COMMUNITY): Payer: Self-pay | Admitting: Certified Registered"

## 2023-07-08 ENCOUNTER — Observation Stay (HOSPITAL_COMMUNITY)
Admission: RE | Admit: 2023-07-08 | Discharge: 2023-07-09 | Disposition: A | Discharge status: Home / Self Care | Attending: Urology | Admitting: Urology

## 2023-07-08 ENCOUNTER — Encounter (HOSPITAL_COMMUNITY): Payer: Self-pay | Admitting: Urology

## 2023-07-08 ENCOUNTER — Ambulatory Visit (HOSPITAL_COMMUNITY)

## 2023-07-08 ENCOUNTER — Other Ambulatory Visit: Payer: Self-pay

## 2023-07-08 DIAGNOSIS — Z01818 Encounter for other preprocedural examination: Secondary | ICD-10-CM

## 2023-07-08 DIAGNOSIS — N200 Calculus of kidney: Secondary | ICD-10-CM | POA: Diagnosis present

## 2023-07-08 DIAGNOSIS — N202 Calculus of kidney with calculus of ureter: Secondary | ICD-10-CM | POA: Diagnosis not present

## 2023-07-08 DIAGNOSIS — F17210 Nicotine dependence, cigarettes, uncomplicated: Secondary | ICD-10-CM | POA: Insufficient documentation

## 2023-07-08 DIAGNOSIS — I10 Essential (primary) hypertension: Secondary | ICD-10-CM

## 2023-07-08 DIAGNOSIS — F418 Other specified anxiety disorders: Secondary | ICD-10-CM

## 2023-07-08 LAB — CBC
HCT: 37.5 % (ref 36.0–46.0)
Hemoglobin: 12.2 g/dL (ref 12.0–15.0)
MCH: 28.6 pg (ref 26.0–34.0)
MCHC: 32.5 g/dL (ref 30.0–36.0)
MCV: 88 fL (ref 80.0–100.0)
Platelets: 144 10*3/uL — ABNORMAL LOW (ref 150–400)
RBC: 4.26 MIL/uL (ref 3.87–5.11)
RDW: 13.1 % (ref 11.5–15.5)
WBC: 7.7 10*3/uL (ref 4.0–10.5)
nRBC: 0 % (ref 0.0–0.2)

## 2023-07-08 LAB — RAPID URINE DRUG SCREEN, HOSP PERFORMED
Amphetamines: NOT DETECTED
Barbiturates: NOT DETECTED
Benzodiazepines: POSITIVE — AB
Cocaine: NOT DETECTED
Opiates: NOT DETECTED
Tetrahydrocannabinol: POSITIVE — AB

## 2023-07-08 LAB — BASIC METABOLIC PANEL WITH GFR
Anion gap: 9 (ref 5–15)
BUN: 21 mg/dL — ABNORMAL HIGH (ref 6–20)
CO2: 25 mmol/L (ref 22–32)
Calcium: 8.4 mg/dL — ABNORMAL LOW (ref 8.9–10.3)
Chloride: 104 mmol/L (ref 98–111)
Creatinine, Ser: 0.97 mg/dL (ref 0.44–1.00)
GFR, Estimated: >60 mL/min (ref >60)
Glucose, Bld: 107 mg/dL — ABNORMAL HIGH (ref 70–99)
Potassium: 3.9 mmol/L (ref 3.5–5.1)
Sodium: 138 mmol/L (ref 135–145)

## 2023-07-08 LAB — POCT PREGNANCY, URINE: Preg Test, Ur: NEGATIVE

## 2023-07-08 SURGERY — NEPHROLITHOTOMY PERCUTANEOUS
Anesthesia: General | Site: Flank | Laterality: Right

## 2023-07-08 MED ORDER — LIDOCAINE 2% (20 MG/ML) 5 ML SYRINGE
INTRAMUSCULAR | Status: AC
  Filled 2023-07-08: qty 5

## 2023-07-08 MED ORDER — KETOROLAC TROMETHAMINE 30 MG/ML IJ SOLN
INTRAMUSCULAR | Status: AC
  Filled 2023-07-08: qty 1

## 2023-07-08 MED ORDER — LISINOPRIL 10 MG PO TABS
10.0000 mg | ORAL_TABLET | Freq: Every day | ORAL | Status: DC
  Administered 2023-07-08 – 2023-07-09 (×2): 10 mg via ORAL
  Filled 2023-07-08 (×2): qty 1

## 2023-07-08 MED ORDER — KETOROLAC TROMETHAMINE 30 MG/ML IJ SOLN
INTRAMUSCULAR | Status: DC | PRN
  Administered 2023-07-08: 30 mg via INTRAVENOUS

## 2023-07-08 MED ORDER — DIATRIZOATE MEGLUMINE 30 % UR SOLN
URETHRAL | Status: AC
  Filled 2023-07-08: qty 100

## 2023-07-08 MED ORDER — ORAL CARE MOUTH RINSE: 15.0000 mL | Freq: Once | OROMUCOSAL | Status: DC

## 2023-07-08 MED ORDER — FENTANYL CITRATE (PF) 100 MCG/2ML IJ SOLN
INTRAMUSCULAR | Status: AC
  Filled 2023-07-08: qty 2

## 2023-07-08 MED ORDER — TRAZODONE HCL 50 MG PO TABS
100.0000 mg | ORAL_TABLET | Freq: Every day | ORAL | Status: DC
  Administered 2023-07-08: 100 mg via ORAL
  Filled 2023-07-08: qty 2

## 2023-07-08 MED ORDER — PROPOFOL 10 MG/ML IV BOLUS
INTRAVENOUS | Status: AC
  Filled 2023-07-08: qty 20

## 2023-07-08 MED ORDER — BUPRENORPHINE HCL-NALOXONE HCL 8-2 MG SL SUBL
1.0000 | SUBLINGUAL_TABLET | Freq: Every day | SUBLINGUAL | Status: DC
  Administered 2023-07-08 – 2023-07-09 (×2): 1 via SUBLINGUAL
  Filled 2023-07-08 (×2): qty 1

## 2023-07-08 MED ORDER — GLYCOPYRROLATE PF 0.2 MG/ML IJ SOSY
PREFILLED_SYRINGE | INTRAMUSCULAR | Status: DC | PRN
  Administered 2023-07-08: .1 mg via INTRAVENOUS

## 2023-07-08 MED ORDER — DEXMEDETOMIDINE HCL IN NACL 80 MCG/20ML IV SOLN
INTRAVENOUS | Status: DC | PRN
  Administered 2023-07-08 (×2): 8 ug via INTRAVENOUS

## 2023-07-08 MED ORDER — SENNOSIDES-DOCUSATE SODIUM 8.6-50 MG PO TABS
2.0000 | ORAL_TABLET | Freq: Every day | ORAL | Status: DC
Start: 2023-07-08 — End: 2023-07-09
  Administered 2023-07-08: 2 via ORAL
  Filled 2023-07-08: qty 2

## 2023-07-08 MED ORDER — ONDANSETRON HCL 4 MG/2ML IJ SOLN: 4.0000 mg | Freq: Once | INTRAMUSCULAR | Status: DC | PRN

## 2023-07-08 MED ORDER — ROCURONIUM BROMIDE 10 MG/ML (PF) SYRINGE
PREFILLED_SYRINGE | INTRAVENOUS | Status: AC
  Filled 2023-07-08: qty 10

## 2023-07-08 MED ORDER — DIPHENHYDRAMINE HCL 12.5 MG/5ML PO ELIX: 12.5000 mg | ORAL_SOLUTION | Freq: Four times a day (QID) | ORAL | Status: DC | PRN

## 2023-07-08 MED ORDER — FENTANYL CITRATE PF 50 MCG/ML IJ SOSY
25.0000 ug | PREFILLED_SYRINGE | INTRAMUSCULAR | Status: DC | PRN
  Filled 2023-07-08: qty 1

## 2023-07-08 MED ORDER — ROCURONIUM BROMIDE 10 MG/ML (PF) SYRINGE
PREFILLED_SYRINGE | INTRAVENOUS | Status: DC | PRN
  Administered 2023-07-08: 50 mg via INTRAVENOUS

## 2023-07-08 MED ORDER — CHLORHEXIDINE GLUCONATE 0.12 % MT SOLN
15.0000 mL | Freq: Once | OROMUCOSAL | Status: AC
  Administered 2023-07-08: 15 mL via OROMUCOSAL

## 2023-07-08 MED ORDER — SODIUM CHLORIDE 0.9 % IV SOLN
INTRAVENOUS | Status: DC | PRN
Start: 2023-07-08 — End: 2023-07-08

## 2023-07-08 MED ORDER — MIDAZOLAM HCL 2 MG/2ML IJ SOLN
INTRAMUSCULAR | Status: AC
  Filled 2023-07-08: qty 2

## 2023-07-08 MED ORDER — BUPROPION HCL ER (XL) 300 MG PO TB24
300.0000 mg | ORAL_TABLET | Freq: Every morning | ORAL | Status: DC
  Administered 2023-07-09: 300 mg via ORAL
  Filled 2023-07-08: qty 1

## 2023-07-08 MED ORDER — PHENYLEPHRINE 80 MCG/ML (10ML) SYRINGE FOR IV PUSH (FOR BLOOD PRESSURE SUPPORT)
PREFILLED_SYRINGE | INTRAVENOUS | Status: AC
  Filled 2023-07-08: qty 10

## 2023-07-08 MED ORDER — STERILE WATER FOR IRRIGATION IR SOLN
Status: DC | PRN
  Administered 2023-07-08: 1000 mL

## 2023-07-08 MED ORDER — ONDANSETRON HCL 4 MG/2ML IJ SOLN
INTRAMUSCULAR | Status: DC | PRN
  Administered 2023-07-08: 4 mg via INTRAVENOUS

## 2023-07-08 MED ORDER — LACTATED RINGERS IV SOLN: INTRAVENOUS | Status: DC

## 2023-07-08 MED ORDER — OXYCODONE HCL 5 MG PO TABS: 5.0000 mg | ORAL_TABLET | ORAL | Status: DC | PRN

## 2023-07-08 MED ORDER — SODIUM CHLORIDE 0.9 % IV SOLN
INTRAVENOUS | Status: DC
Start: 2023-07-08 — End: 2023-07-09

## 2023-07-08 MED ORDER — CLONAZEPAM 0.5 MG PO TABS
0.5000 mg | ORAL_TABLET | Freq: Two times a day (BID) | ORAL | Status: DC
  Administered 2023-07-08 – 2023-07-09 (×2): 0.5 mg via ORAL
  Filled 2023-07-08 (×2): qty 1

## 2023-07-08 MED ORDER — FENTANYL CITRATE (PF) 100 MCG/2ML IJ SOLN
INTRAMUSCULAR | Status: DC | PRN
  Administered 2023-07-08 (×2): 50 ug via INTRAVENOUS

## 2023-07-08 MED ORDER — GLYCOPYRROLATE PF 0.2 MG/ML IJ SOSY
PREFILLED_SYRINGE | INTRAMUSCULAR | Status: AC
Start: 2023-07-08 — End: ?
  Filled 2023-07-08: qty 1

## 2023-07-08 MED ORDER — ACETAMINOPHEN 325 MG PO TABS
650.0000 mg | ORAL_TABLET | ORAL | Status: DC | PRN
Start: 2023-07-08 — End: 2023-07-09
  Administered 2023-07-08: 650 mg via ORAL
  Filled 2023-07-08: qty 2

## 2023-07-08 MED ORDER — LIDOCAINE 2% (20 MG/ML) 5 ML SYRINGE
INTRAMUSCULAR | Status: DC | PRN
  Administered 2023-07-08: 100 mg via INTRAVENOUS

## 2023-07-08 MED ORDER — SIMETHICONE 80 MG PO CHEW
40.0000 mg | CHEWABLE_TABLET | Freq: Four times a day (QID) | ORAL | Status: DC | PRN
  Administered 2023-07-08: 80 mg via ORAL
  Filled 2023-07-08: qty 1

## 2023-07-08 MED ORDER — SODIUM CHLORIDE 0.9 % IR SOLN
Status: DC | PRN
Start: 2023-07-08 — End: 2023-07-08
  Administered 2023-07-08: 3000 mL

## 2023-07-08 MED ORDER — PHENYLEPHRINE 80 MCG/ML (10ML) SYRINGE FOR IV PUSH (FOR BLOOD PRESSURE SUPPORT)
PREFILLED_SYRINGE | INTRAVENOUS | Status: DC | PRN
  Administered 2023-07-08: 40 ug via INTRAVENOUS

## 2023-07-08 MED ORDER — SUGAMMADEX SODIUM 200 MG/2ML IV SOLN
INTRAVENOUS | Status: DC | PRN
  Administered 2023-07-08: 200 mg via INTRAVENOUS

## 2023-07-08 MED ORDER — ALUM & MAG HYDROXIDE-SIMETH 200-200-20 MG/5ML PO SUSP
15.0000 mL | ORAL | Status: DC | PRN
  Administered 2023-07-08: 30 mL via ORAL
  Filled 2023-07-08: qty 30

## 2023-07-08 MED ORDER — SERTRALINE HCL 50 MG PO TABS
200.0000 mg | ORAL_TABLET | Freq: Every morning | ORAL | Status: DC
  Administered 2023-07-09: 200 mg via ORAL
  Filled 2023-07-08: qty 4

## 2023-07-08 MED ORDER — KETOROLAC TROMETHAMINE 30 MG/ML IJ SOLN
30.0000 mg | Freq: Three times a day (TID) | INTRAMUSCULAR | Status: DC
  Administered 2023-07-08 – 2023-07-09 (×3): 30 mg via INTRAVENOUS
  Filled 2023-07-08 (×3): qty 1

## 2023-07-08 MED ORDER — NICOTINE 21 MG/24HR TD PT24
21.0000 mg | MEDICATED_PATCH | Freq: Every day | TRANSDERMAL | Status: DC
  Administered 2023-07-08 – 2023-07-09 (×2): 21 mg via TRANSDERMAL
  Filled 2023-07-08 (×3): qty 1

## 2023-07-08 MED ORDER — ONDANSETRON HCL 4 MG/2ML IJ SOLN: 4.0000 mg | INTRAMUSCULAR | Status: DC | PRN

## 2023-07-08 MED ORDER — SODIUM CHLORIDE (PF) 0.9 % IJ SOLN
INTRAMUSCULAR | Status: DC | PRN
  Administered 2023-07-08: 500 mL via INTRAVENOUS

## 2023-07-08 MED ORDER — FENTANYL CITRATE PF 50 MCG/ML IJ SOSY
25.0000 ug | PREFILLED_SYRINGE | INTRAMUSCULAR | Status: DC | PRN
  Administered 2023-07-08: 50 ug via INTRAVENOUS
  Filled 2023-07-08: qty 1

## 2023-07-08 MED ORDER — OXYBUTYNIN CHLORIDE 5 MG PO TABS
5.0000 mg | ORAL_TABLET | Freq: Three times a day (TID) | ORAL | Status: DC | PRN
  Administered 2023-07-08: 5 mg via ORAL
  Filled 2023-07-08: qty 1

## 2023-07-08 MED ORDER — CHLORHEXIDINE GLUCONATE 0.12 % MT SOLN
15.0000 mL | Freq: Once | OROMUCOSAL | Status: DC
  Administered 2023-07-08: 15 mL via OROMUCOSAL

## 2023-07-08 MED ORDER — OXYCODONE HCL 5 MG/5ML PO SOLN: 5.0000 mg | Freq: Once | ORAL | Status: AC | PRN

## 2023-07-08 MED ORDER — DIATRIZOATE MEGLUMINE 30 % UR SOLN
URETHRAL | Status: DC | PRN
Start: 2023-07-08 — End: 2023-07-08
  Administered 2023-07-08: 20 mL via URETHRAL

## 2023-07-08 MED ORDER — SODIUM CHLORIDE 0.9 % IV SOLN
2.0000 g | INTRAVENOUS | Status: AC
  Administered 2023-07-08: 2 g via INTRAVENOUS
  Filled 2023-07-08: qty 20

## 2023-07-08 MED ORDER — DIPHENHYDRAMINE HCL 50 MG/ML IJ SOLN: 12.5000 mg | Freq: Four times a day (QID) | INTRAMUSCULAR | Status: DC | PRN

## 2023-07-08 MED ORDER — MIDAZOLAM HCL 2 MG/2ML IJ SOLN
INTRAMUSCULAR | Status: DC | PRN
  Administered 2023-07-08: 2 mg via INTRAVENOUS

## 2023-07-08 MED ORDER — DEXAMETHASONE SODIUM PHOSPHATE 10 MG/ML IJ SOLN
INTRAMUSCULAR | Status: DC | PRN
  Administered 2023-07-08: 5 mg via INTRAVENOUS

## 2023-07-08 MED ORDER — ONDANSETRON HCL 4 MG/2ML IJ SOLN
INTRAMUSCULAR | Status: AC
  Filled 2023-07-08: qty 2

## 2023-07-08 MED ORDER — PROPOFOL 10 MG/ML IV BOLUS
INTRAVENOUS | Status: DC | PRN
  Administered 2023-07-08: 250 mg via INTRAVENOUS

## 2023-07-08 MED ORDER — OXYCODONE HCL 5 MG PO TABS
5.0000 mg | ORAL_TABLET | Freq: Once | ORAL | Status: AC | PRN
  Administered 2023-07-08: 5 mg via ORAL
  Filled 2023-07-08: qty 1

## 2023-07-08 MED ORDER — LORATADINE 10 MG PO TABS
10.0000 mg | ORAL_TABLET | Freq: Every day | ORAL | Status: DC
  Administered 2023-07-08 – 2023-07-09 (×2): 10 mg via ORAL
  Filled 2023-07-08 (×2): qty 1

## 2023-07-08 SURGICAL SUPPLY — 46 items
BAG URINE DRAIN 2000ML AR STRL (UROLOGICAL SUPPLIES) ×2 IMPLANT
BENZOIN TINCTURE PRP APPL 2/3 (GAUZE/BANDAGES/DRESSINGS) ×3 IMPLANT
BLADE SURG 15 STRL LF DISP TIS (BLADE) ×2 IMPLANT
CATCHER STONE W/TUBE ADAPTER (UROLOGICAL SUPPLIES) ×1 IMPLANT
CATH FOLEY 2WAY SLVR 5CC 18FR (CATHETERS) ×2 IMPLANT
CATH INTERMIT  6FR 70CM (CATHETERS) ×2 IMPLANT
CATH UROLOGY TORQUE 65 (CATHETERS) ×2 IMPLANT
CATH X-FORCE N30 NEPHROSTOMY (TUBING) ×2 IMPLANT
CHLORAPREP W/TINT 26 (MISCELLANEOUS) ×1 IMPLANT
COUNTER NDL MAGNETIC 40 RED (SET/KITS/TRAYS/PACK) ×1 IMPLANT
COUNTER NEEDLE MAGNETIC 40 RED (SET/KITS/TRAYS/PACK) ×2 IMPLANT
COVER LIGHT HANDLE STERIS (MISCELLANEOUS) ×4 IMPLANT
DRAPE C-ARM FOLDED MOBILE STRL (DRAPES) ×2 IMPLANT
DRAPE HALF SHEET 40X57 (DRAPES) ×2 IMPLANT
DRAPE LINGEMAN PERC (DRAPES) ×2 IMPLANT
DRSG PAD ABDOMINAL 8X10 ST (GAUZE/BANDAGES/DRESSINGS) ×5 IMPLANT
DRSG TEGADERM 8X12 (GAUZE/BANDAGES/DRESSINGS) ×5 IMPLANT
GAUZE PAD ABD 8X10 STRL (GAUZE/BANDAGES/DRESSINGS) ×3 IMPLANT
GAUZE SPONGE 4X4 12PLY STRL (GAUZE/BANDAGES/DRESSINGS) ×2 IMPLANT
GLOVE BIO SURGEON STRL SZ8 (GLOVE) ×2 IMPLANT
GLOVE BIOGEL PI IND STRL 7.0 (GLOVE) ×4 IMPLANT
GLOVE BIOGEL PI IND STRL 8 (GLOVE) ×1 IMPLANT
GOWN STRL REUS W/TWL LRG LVL3 (GOWN DISPOSABLE) ×2 IMPLANT
GOWN STRL REUS W/TWL XL LVL3 (GOWN DISPOSABLE) ×2 IMPLANT
GUIDEWIRE AMPLAZ .035X145 (WIRE) ×2 IMPLANT
GUIDEWIRE STR DUAL SENSOR (WIRE) ×2 IMPLANT
IV NS 500ML BAXH (IV SOLUTION) ×2 IMPLANT
KIT PROBE TRILOGY 3.9X350 (MISCELLANEOUS) ×2 IMPLANT
KIT TURNOVER KIT A (KITS) ×2 IMPLANT
MANIFOLD NEPTUNE II (INSTRUMENTS) ×2 IMPLANT
PACK CYSTO (CUSTOM PROCEDURE TRAY) ×2 IMPLANT
PAD ARMBOARD POSITIONER FOAM (MISCELLANEOUS) ×2 IMPLANT
POSITIONER FOAM HEAD TRAC HOLE (MISCELLANEOUS) ×2 IMPLANT
POSITIONER HEAD 8X9X4 ADT (SOFTGOODS) ×2 IMPLANT
SET BASIN LINEN APH (SET/KITS/TRAYS/PACK) ×2 IMPLANT
SHEATH COOK PEEL AWAY SET 9F (SHEATH) ×1 IMPLANT
SHEATH PEELAWAY SET 9 (SHEATH) ×2 IMPLANT
SOL .9 NS 3000ML IRR UROMATIC (IV SOLUTION) ×4 IMPLANT
SPONGE DRAIN TRACH 4X4 STRL 2S (GAUZE/BANDAGES/DRESSINGS) ×2 IMPLANT
STENT URET 6FRX26 CONTOUR (STENTS) ×1 IMPLANT
STONE CATCHER W/TUBE ADAPTER (UROLOGICAL SUPPLIES) ×2 IMPLANT
SUT SILK 2 0 SH (SUTURE) ×2 IMPLANT
SYR 50ML LL SCALE MARK (SYRINGE) ×2 IMPLANT
TRAY FOLEY W/BAG SLVR 16FR ST (SET/KITS/TRAYS/PACK) ×2 IMPLANT
TUBE CONNECTING 12X1/4 (SUCTIONS) ×4 IMPLANT
WATER STERILE IRR 1000ML POUR (IV SOLUTION) ×2 IMPLANT

## 2023-07-08 NOTE — Anesthesia Procedure Notes (Signed)
 Procedure Name: Intubation Date/Time: 07/08/2023 8:49 AM  Performed by: Sherwin Donate, CRNAPre-anesthesia Checklist: Patient identified, Emergency Drugs available, Suction available and Patient being monitored Patient Re-evaluated:Patient Re-evaluated prior to induction Oxygen Delivery Method: Circle system utilized Preoxygenation: Pre-oxygenation with 100% oxygen Induction Type: IV induction Ventilation: Mask ventilation without difficulty Laryngoscope Size: Miller and 3 Grade View: Grade II Tube type: Oral Tube size: 7.0 mm Number of attempts: 1 Airway Equipment and Method: Stylet Placement Confirmation: positive ETCO2, ETT inserted through vocal cords under direct vision and breath sounds checked- equal and bilateral Secured at: 22 cm Tube secured with: Tape

## 2023-07-08 NOTE — Anesthesia Preprocedure Evaluation (Signed)
 Anesthesia Evaluation  Patient identified by MRN, date of birth, ID band Patient awake    Reviewed: Allergy & Precautions, H&P , NPO status , Patient's Chart, lab work & pertinent test results, reviewed documented beta blocker date and time   Airway Mallampati: II  TM Distance: >3 FB Neck ROM: full    Dental no notable dental hx.    Pulmonary neg pulmonary ROS, Current Smoker   Pulmonary exam normal breath sounds clear to auscultation       Cardiovascular Exercise Tolerance: Good hypertension, negative cardio ROS  Rhythm:regular Rate:Normal     Neuro/Psych  PSYCHIATRIC DISORDERS Anxiety Depression    negative neurological ROS  negative psych ROS   GI/Hepatic negative GI ROS, Neg liver ROS,,,  Endo/Other  negative endocrine ROS    Renal/GU Renal diseasenegative Renal ROS  negative genitourinary   Musculoskeletal   Abdominal   Peds  Hematology negative hematology ROS (+)   Anesthesia Other Findings   Reproductive/Obstetrics negative OB ROS                             Anesthesia Physical Anesthesia Plan  ASA: 3  Anesthesia Plan: General and General ETT   Post-op Pain Management:    Induction:   PONV Risk Score and Plan: Ondansetron   Airway Management Planned:   Additional Equipment:   Intra-op Plan:   Post-operative Plan:   Informed Consent: I have reviewed the patients History and Physical, chart, labs and discussed the procedure including the risks, benefits and alternatives for the proposed anesthesia with the patient or authorized representative who has indicated his/her understanding and acceptance.     Dental Advisory Given  Plan Discussed with: CRNA  Anesthesia Plan Comments:        Anesthesia Quick Evaluation

## 2023-07-08 NOTE — Transfer of Care (Signed)
 Immediate Anesthesia Transfer of Care Note  Patient: Angelica Lopez  Procedure(s) Performed: NEPHROLITHOTOMY PERCUTANEOUS (Right: Flank)  Patient Location: PACU  Anesthesia Type:General  Level of Consciousness: awake  Airway & Oxygen Therapy: Patient Spontanous Breathing and Patient connected to face mask oxygen  Post-op Assessment: Report given to RN and Post -op Vital signs reviewed and stable  Post vital signs: Reviewed and stable  Last Vitals:  Vitals Value Taken Time  BP 151/105 07/08/23 0949  Temp    Pulse 84 07/08/23 0951  Resp 21 07/08/23 0951  SpO2 100 % 07/08/23 0951  Vitals shown include unfiled device data.  Last Pain:  Vitals:   07/08/23 0736  TempSrc: Oral  PainSc: 8          Complications: No notable events documented.

## 2023-07-08 NOTE — Op Note (Signed)
 Preoperative diagnosis: Right renal stone  Postoperative diagnosis: Same  Procedure 1.  Right percutaneous nephrostolithotomy for stone less than 2 cm 2.  Right nephrostogram 3.  Intraoperative fluoroscopy, under 1 hour, with interpretation 4.  Placement of a 6 x 26 double-J ureteral stent. 5.  Placement of a 43 French nephrostomy tube 6.  Dilation of percutaneous tract  Attending: Johnie Nailer, MD  Anesthesia: General  Estimated blood loss: Minimal  Antibiotics: Zosyn  Drains: 1.  16 French Foley catheter 2.  6 x 26 right double-J ureteral stent 3.  18 French nephrostomy tube  Specimens: Stone for analysis  Findings: 1.7cm UPJ calculus. Limited drainage of contrast down the ureter prior to removing UPJ calculus. Minimal extravasation following PCNL  Indications: Patient is a 43 year old female with a history of large right renal stone.  After discussing treatment options and decided she was right percutaneous nephrostolithotomy.  Patient already has a nephrostomy tube.  Procedure in detail: Prior to procedure consent was obtained.  Patient was brought to the operating room debridement was done to ensure correct patient, correct procedure, and correct site.  General anesthesia was administered.  A 16 French Foley catheter was in place.  The patient was then placed in the prone position.  His nephrostomy tube and right flank was then prepped and draped in usual sterile fashion.  A nephrostogram was obtained and findings noted above.  Through the nephrostomy tube we then placed a sensor wire.  Sensor wire was coiled in the renal pelvis we then removed the nephrostomy tube.  We then made an incision at the level of the skin and over the wire we then placed a NephroMax dilator.  We dilated the nephrostomy tract to 30 [place] and held this 18 cm of water for 1 minute.  We then  placed the access sheath over the balloon.  The balloon was then deflated.  We then used a rigid nephroscope to  perform nephroscopy.  We encountered a large UPJ calculus.  We then used a trilogy to fragment the stone in multiple pieces.  Using the graspers we removed stone fragments and sent for composition analysis.  Once the majority of the stone was removed we then were able to perform nephroscopy with a flexible nephroscope.   We then placed a second wire through the ureteroscope into the bladder.  We then removed the nephroscope and over the wire placed a 6 x 26 double-J ureteral stent.  The wire was then removed and good coil was noted in the renal pelvis under direct vision in the bladder under fluoroscopy.  We then placed a 18 French nephrostomy tube through the sheath into the renal pelvis.  The balloon was inflated with 3 amounts of contrast.  We then removed the access sheath in and obtain another nephrostogram.  We noted minimal extravasation of contrast.  We then secured the nephrostomy tubes with 0 silks in interrupted fashion.  Dressing was placed over the nephrostomy tube site and this then concluded the procedure was well-tolerated by the patient.  Complications: None  Condition: Stable, extubated, transferred to PACU  Plan: Patient is to be admitted overnight for observation.  Her Foley catheter will be removed in the morning. Nephrostomy tube will be removed in 3 days. She is then to be discharged home and followup in 1 week for stent removal

## 2023-07-08 NOTE — H&P (Signed)
 PI: Angelica Lopez is a 42yo here for right percutaneous nephrostolithotomy for a right renal calculus. She was admitted 6 weeks ago and was diagnosed with a 1.4cm right UPJ/renal pelvis calculus. She is having intermittent right flank pain.  She denies any fevers/chills. No worsening LUTS.      PMH:     Past Medical History:  Diagnosis Date   Anxiety     Back pain     Depression     Hypertension     Kyphosis     Scheuermann's disease            Surgical History:      Past Surgical History:  Procedure Laterality Date   CESAREAN SECTION       TONSILLECTOMY              Home Medications:  Allergies as of 05/12/2023         Reactions    Codeine  Anaphylaxis    Tongue swells            Medication List           Accurate as of May 12, 2023  8:47 AM. If you have any questions, ask your nurse or doctor.              Buprenorphine  HCl-Naloxone  HCl 8-2 MG Film Place 3 Film under the tongue daily.    buPROPion  300 MG 24 hr tablet Commonly known as: WELLBUTRIN  XL Take 300 mg by mouth daily.    cetirizine  10 MG tablet Commonly known as: ZyrTEC  Allergy Take 1 tablet (10 mg total) by mouth daily.    clonazePAM  0.5 MG tablet Commonly known as: KLONOPIN  Take 1 tablet (0.5 mg total) by mouth every other day as needed for up to 4 days for anxiety. What changed: when to take this    hydrOXYzine 25 MG tablet Commonly known as: ATARAX Take 25 mg by mouth daily as needed for anxiety or itching.    ketorolac  10 MG tablet Commonly known as: TORADOL  Take 1 tablet (10 mg total) by mouth every 6 (six) hours as needed for moderate pain (pain score 4-6) or severe pain (pain score 7-10).    levonorgestrel  20 MCG/24HR IUD Commonly known as: MIRENA  1 each by Intrauterine route once.    lisinopril  20 MG tablet Commonly known as: ZESTRIL  Take 1 tablet (20 mg total) by mouth daily. Hold until follow up    sertraline  100 MG tablet Commonly known as: ZOLOFT  Take 1 tablet (100 mg  total) by mouth daily. What changed: how much to take    sulfamethoxazole -trimethoprim  800-160 MG tablet Commonly known as: Bactrim  DS Take 1 tablet by mouth 2 (two) times daily for 10 days.    traZODone  100 MG tablet Commonly known as: DESYREL  Take 100 mg by mouth at bedtime.             Allergies:  Allergies       Allergies  Allergen Reactions   Codeine  Anaphylaxis      Tongue swells        Family History:      Family History  Problem Relation Age of Onset   Heart disease Maternal Grandfather          has had 3 open heart surgeries   Hypertension Father     Diabetes Father     Anxiety disorder Father     Depression Father     Anxiety disorder Mother     Depression Mother  Febrile seizures Daughter     Febrile seizures Daughter     Febrile seizures Daughter            Social History:  reports that she has been smoking cigarettes. She has a 30 pack-year smoking history. She has never used smokeless tobacco. She reports current drug use. Drug: Marijuana. She reports that she does not drink alcohol.   ROS: All other review of systems were reviewed and are negative except what is noted above in HPI   Physical Exam: BP 128/61   Pulse 67   Constitutional:  Alert and oriented, No acute distress. HEENT: [place] AT, moist mucus membranes.  Trachea midline, no masses. Cardiovascular: No clubbing, cyanosis, or edema. Respiratory: Normal respiratory effort, no increased work of breathing. GI: Abdomen is soft, nontender, nondistended, no abdominal masses GU: No CVA tenderness.  Lymph: No cervical or inguinal lymphadenopathy. Skin: No rashes, bruises or suspicious lesions. Neurologic: Grossly intact, no focal deficits, moving all 4 extremities. Psychiatric: Normal mood and affect.   Laboratory Data: Recent Labs       Lab Results  Component Value Date    WBC 5.3 05/08/2023    HGB 10.5 (L) 05/08/2023    HCT 32.9 (L) 05/08/2023    MCV 88.7 05/08/2023    PLT 126  (L) 05/08/2023        Recent Labs       Lab Results  Component Value Date    CREATININE 1.09 (H) 05/08/2023        Recent Labs  No results found for: "PSA"     Recent Labs  No results found for: "TESTOSTERONE"     Recent Labs  No results found for: "HGBA1C"     Urinalysis Labs (Brief)          Component Value Date/Time    COLORURINE YELLOW 05/07/2023 0159    APPEARANCEUR CLOUDY (A) 05/07/2023 0159    LABSPEC 1.017 05/07/2023 0159    PHURINE 7.0 05/07/2023 0159    GLUCOSEU NEGATIVE 05/07/2023 0159    HGBUR MODERATE (A) 05/07/2023 0159    BILIRUBINUR NEGATIVE 05/07/2023 0159    KETONESUR NEGATIVE 05/07/2023 0159    PROTEINUR 30 (A) 05/07/2023 0159    UROBILINOGEN 0.2 05/16/2012 1121    NITRITE NEGATIVE 05/07/2023 0159    LEUKOCYTESUR MODERATE (A) 05/07/2023 0159        Recent Labs       Lab Results  Component Value Date    BACTERIA FEW (A) 05/07/2023        Pertinent Imaging: CT 05/07/2023: Images reviewed and discussed with the patient  No results found for this or any previous visit.   No results found for this or any previous visit.   No results found for this or any previous visit.   No results found for this or any previous visit.   No results found for this or any previous visit.   No results found for this or any previous visit.   No results found for this or any previous visit.   No results found for this or any previous visit.     Assessment & Plan:     1. Nephrolithiasis (Primary) -We discussed the management of kidney stones. These options include observation, ureteroscopy, shockwave lithotripsy (ESWL) and percutaneous nephrolithotomy (PCNL). We discussed which options are relevant to the patient's stone(s). We discussed the natural history of kidney stones as well as the complications of untreated stones and the impact on quality of life without  treatment as well as with each of the above listed treatments. We also discussed the  efficacy of each treatment in its ability to clear the stone burden. With any of these management options I discussed the signs and symptoms of infection and the need for emergent treatment should these be experienced. For each option we discussed the ability of each procedure to clear the patient of their stone burden.   For observation I described the risks which include but are not limited to silent renal damage, life-threatening infection, need for emergent surgery, failure to pass stone and pain.   For ureteroscopy I described the risks which include bleeding, infection, damage to contiguous structures, positioning injury, ureteral stricture, ureteral avulsion, ureteral injury, need for prolonged ureteral stent, inability to perform ureteroscopy, need for an interval procedure, inability to clear stone burden, stent discomfort/pain, heart attack, stroke, pulmonary embolus and the inherent risks with general anesthesia.   For shockwave lithotripsy I described the risks which include arrhythmia, kidney contusion, kidney hemorrhage, need for transfusion, pain, inability to adequately break up stone, inability to pass stone fragments, Steinstrasse, infection associated with obstructing stones, need for alternate surgical procedure, need for repeat shockwave lithotripsy, MI, CVA, PE and the inherent risks with anesthesia/conscious sedation.   For PCNL I described the risks including positioning injury, pneumothorax, hydrothorax, need for chest tube, inability to clear stone burden, renal laceration, arterial venous fistula or malformation, need for embolization of kidney, loss of kidney or renal function, need for repeat procedure, need for prolonged nephrostomy tube, ureteral avulsion, MI, CVA, PE and the inherent risks of general anesthesia.   - The patient would like to proceed with right PCNL

## 2023-07-08 NOTE — Progress Notes (Signed)
 Dr. Margrette Shield notified pt Positive for Benzo and THC, THC is new for today he said, We can do Her

## 2023-07-09 ENCOUNTER — Encounter (HOSPITAL_COMMUNITY): Payer: Self-pay | Admitting: Urology

## 2023-07-09 DIAGNOSIS — N200 Calculus of kidney: Secondary | ICD-10-CM | POA: Diagnosis not present

## 2023-07-09 LAB — BASIC METABOLIC PANEL WITH GFR
Anion gap: 6 (ref 5–15)
BUN: 20 mg/dL (ref 6–20)
CO2: 25 mmol/L (ref 22–32)
Calcium: 8.3 mg/dL — ABNORMAL LOW (ref 8.9–10.3)
Chloride: 107 mmol/L (ref 98–111)
Creatinine, Ser: 0.91 mg/dL (ref 0.44–1.00)
GFR, Estimated: >60 mL/min (ref >60)
Glucose, Bld: 92 mg/dL (ref 70–99)
Potassium: 3.5 mmol/L (ref 3.5–5.1)
Sodium: 138 mmol/L (ref 135–145)

## 2023-07-09 LAB — CBC
HCT: 35.5 % — ABNORMAL LOW (ref 36.0–46.0)
Hemoglobin: 11.7 g/dL — ABNORMAL LOW (ref 12.0–15.0)
MCH: 28.6 pg (ref 26.0–34.0)
MCHC: 33 g/dL (ref 30.0–36.0)
MCV: 86.8 fL (ref 80.0–100.0)
Platelets: 126 10*3/uL — ABNORMAL LOW (ref 150–400)
RBC: 4.09 MIL/uL (ref 3.87–5.11)
RDW: 13.2 % (ref 11.5–15.5)
WBC: 7.1 10*3/uL (ref 4.0–10.5)
nRBC: 0 % (ref 0.0–0.2)

## 2023-07-09 MED ORDER — KETOROLAC TROMETHAMINE 10 MG PO TABS: 10.0000 mg | ORAL_TABLET | Freq: Four times a day (QID) | ORAL | 0 refills | Status: AC | PRN

## 2023-07-09 NOTE — TOC CM/SW Note (Signed)
 Transition of Care [facility]) - Inpatient Brief Assessment   Patient Details  Name: Angelica Lopez MRN: [id number] Date of Birth: 10-04-1980  Transition of Care [facility]) CM/SW Contact:    Grandville Lax, LCSWA Phone Number: 07/09/2023, 9:15 AM   Clinical Narrative: Transition of Care Department [facility]) has reviewed patient and no TOC needs have been identified at this time. We will continue to monitor patient advancement through interdiciplinary progression rounds. If new patient transition needs arise, please place a TOC consult.   Transition of Care Asessment: Insurance and Status: Insurance coverage has been reviewed Patient has primary care physician: Yes Home environment has been reviewed: From home Prior level of function:: Independent Prior/Current Home Services: No current home services Social Drivers of Health Review: SDOH reviewed no interventions necessary Readmission risk has been reviewed: Yes Transition of care needs: no transition of care needs at this time

## 2023-07-09 NOTE — Plan of Care (Signed)

## 2023-07-09 NOTE — Progress Notes (Signed)
 Name: Angelica Lopez DOB: [birth year] MRN: [id number]  Diagnoses: Post-operative state  HPI: She presents postoperatively.  She underwent the following procedures by Dr. Claretta Croft on 07/08/2023:  Preoperative diagnosis: Right renal stone   Postoperative diagnosis: Same   Procedure  1.  Right percutaneous nephrostolithotomy for stone less than 2 cm 2.  Right nephrostogram 3.  Intraoperative fluoroscopy, under 1 hour, with interpretation 4.  Placement of a 6 x 26 double-J ureteral stent. 5.  Placement of a 12 French nephrostomy tube 6.  Dilation of percutaneous tract  Postop course: Today She reports doing well overall aside from some mild soreness at the right flank incision site. Reports that her urine is starting to look more yellow than red. Denies fevers, nausea, vomiting, abdominal pain.   Medications: Current Outpatient Medications  Medication Sig Dispense Refill   Buprenorphine  HCl-Naloxone  HCl 8-2 MG FILM Place 1 Film under the tongue in the morning, at noon, and at bedtime.     buPROPion  (WELLBUTRIN  XL) 300 MG 24 hr tablet Take 300 mg by mouth in the morning.     cetirizine  (ZYRTEC  ALLERGY) 10 MG tablet Take 1 tablet (10 mg total) by mouth daily. 30 tablet 0   clonazePAM  (KLONOPIN ) 0.5 MG tablet Take 0.5 mg by mouth in the morning and at bedtime.     ibuprofen  (ADVIL ) 200 MG tablet Take 800 mg by mouth every 8 (eight) hours as needed (pain.).     ketorolac  (TORADOL ) 10 MG tablet Take 1 tablet (10 mg total) by mouth every 6 (six) hours as needed. 20 tablet 0   ketorolac  (TORADOL ) 10 MG tablet Take 1 tablet (10 mg total) by mouth every 6 (six) hours as needed for moderate pain (pain score 4-6) or severe pain (pain score 7-10). 30 tablet 0   levonorgestrel  (MIRENA ) 20 MCG/24HR IUD 1 each by Intrauterine route once.     lisinopril  (ZESTRIL ) 20 MG tablet Take 1 tablet (20 mg total) by mouth daily. Hold until follow up (Patient taking differently: Take 10 mg by mouth daily.)      Probiotic Product (PROBIOTIC PO) Take 1 capsule by mouth in the morning. [place] Women's Probiotic Dietary Supplement     sertraline  (ZOLOFT ) 100 MG tablet Take 200 mg by mouth in the morning.     traZODone  (DESYREL ) 100 MG tablet Take 100 mg by mouth at bedtime.     No current facility-administered medications for this visit.    Allergies: Allergies  Allergen Reactions   Codeine  Anaphylaxis    Tongue swells    Past Medical History:  Diagnosis Date   Anxiety    Back pain    Depression    Hypertension    Kyphosis    Scheuermann's disease    Past Surgical History:  Procedure Laterality Date   CESAREAN SECTION     IR NEPHROSTOMY PLACEMENT RIGHT  07/07/2023   NEPHROLITHOTOMY Right 07/08/2023   Procedure: NEPHROLITHOTOMY PERCUTANEOUS;  Surgeon: Marco Severs, MD;  Location: [facility];  Service: Urology;  Laterality: Right;   TONSILLECTOMY     Family History  Problem Relation Age of Onset   Heart disease Maternal Grandfather        has had 3 open heart surgeries   Hypertension Father    Diabetes Father    Anxiety disorder Father    Depression Father    Anxiety disorder Mother    Depression Mother    Febrile seizures Daughter    Febrile seizures Daughter  Febrile seizures Daughter    Social History   Socioeconomic History   Marital status: Legally Separated    Spouse name: Not on file   Number of children: 3   Years of education: Not on file   Highest education level: Not on file  Occupational History   Not on file  Tobacco Use   Smoking status: Every Day    Current packs/day: 2.00    Average packs/day: 2.0 packs/day for 15.0 years (30.0 ttl pk-yrs)    Types: Cigarettes   Smokeless tobacco: Never  Vaping Use   Vaping status: Never Used  Substance and Sexual Activity   Alcohol use: No   Drug use: Yes    Types: Marijuana    Comment: twice a week   Sexual activity: Not Currently    Birth control/protection: I.U.D.  Other Topics Concern   Not on  file  Social History Narrative   Not on file   Social Drivers of Health   Financial Resource Strain: Low Risk  (03/08/2023)   Received from [facility]   Overall Financial Resource Strain (CARDIA)    Difficulty of Paying Living Expenses: Not hard at all  Food Insecurity: No Food Insecurity (07/08/2023)   Hunger Vital Sign    Worried About Running Out of Food in the Last Year: Never true    Ran Out of Food in the Last Year: Never true  Transportation Needs: No Transportation Needs (07/08/2023)   PRAPARE - Administrator, Civil Service (Medical): No    Lack of Transportation (Non-Medical): No  Physical Activity: Unknown (03/20/2022)   Received from [facility], [facility]   Exercise Vital Sign    Days of Exercise per Week: 0 days    Minutes of Exercise per Session: Not on file  Recent Concern: Physical Activity - Inactive (03/20/2022)   Received from [facility]   Exercise Vital Sign    Days of Exercise per Week: 0 days    Minutes of Exercise per Session: 0 min  Stress: Stress Concern Present (03/20/2022)   Received from [facility], [facility] Occupational Health - Occupational Stress Questionnaire    Feeling of Stress : Very much  Social Connections: Socially Isolated (03/20/2022)   Received from [facility], [facility]   Social Network    How would you rate your social network (family, work, friends)?: Little participation, lonely and socially isolated  Intimate Partner Violence: Not At Risk (07/08/2023)   Humiliation, Afraid, Rape, and Kick questionnaire    Fear of Current or Ex-Partner: No    Emotionally Abused: No    Physically Abused: No    Sexually Abused: No    SUBJECTIVE  Review of Systems Constitutional: Patient denies any unintentional weight loss or change in strength lntegumentary: Patient denies any rashes or pruritus Cardiovascular: Patient denies chest pain or syncope Respiratory: Patient denies shortness of  breath Gastrointestinal: As per HPI Musculoskeletal: Patient denies muscle cramps or weakness Neurologic: Patient denies convulsions or seizures Allergic/Immunologic: Patient denies recent allergic reaction(s) Hematologic/Lymphatic: Patient denies bleeding tendencies Endocrine: Patient denies heat/cold intolerance  GU: As per HPI.  OBJECTIVE Vitals:   07/12/23 0904  BP: 133/85  Pulse: 77   There is no height or weight on file to calculate BMI.  Physical Examination Constitutional: No obvious distress; patient is non-toxic appearing  Cardiovascular: No visible lower extremity edema.  Respiratory: The patient does not have audible wheezing/stridor; respirations do not appear labored  Gastrointestinal: Abdomen  non-distended Musculoskeletal: Normal ROM of UEs  Skin: No obvious rashes/open sores  Neurologic: CN 2-12 grossly intact Psychiatric: Answered questions appropriately with normal affect  Hematologic/Lymphatic/Immunologic: No obvious bruises or sites of spontaneous bleeding  GU: Right flank incision site has no surrounding erythema, edema, crepitus, fluctuance, warmth, significant tenderness to palpation. Right PCN draining clear red urine.   ASSESSMENT Nephrolithiasis - Plan: CANCELED: Urinalysis, Routine w reflex microscopic  Postop check - Plan: CANCELED: Urinalysis, Routine w reflex microscopic  We reviewed the operative procedures and findings. Surgical site healing well. Pain is well controlled. Right PCN removed; patient tolerated well. Will plan for follow up as previously scheduled with Dr. Claretta Croft on 07/19/2023 for right ureteral stent removal. Pt verbalized understanding and agreement. All questions were answered.  PLAN Advised the following: Return in 1 week (on 07/19/2023) for as previously scheduled with Dr. Claretta Croft.  No orders of the defined types were placed in this encounter.   It has been explained that the patient is to follow regularly with their PCP  in addition to all other providers involved in their care and to follow instructions provided by these respective offices. Patient advised to contact urology clinic if any urologic-pertaining questions, concerns, new symptoms or problems arise in the interim period.  There are no Patient Instructions on file for this visit.  Electronically signed by:  Lauretta Ponto, MSN, FNP-C, CUNP 07/12/2023 9:53 AM

## 2023-07-09 NOTE — Plan of Care (Signed)
   Problem: Education: Goal: Knowledge of General Education information will improve Description: Including pain rating scale, medication(s)/side effects and non-pharmacologic comfort measures Outcome: Progressing   Problem: Clinical Measurements: Goal: Will remain free from infection Outcome: Progressing   Problem: Activity: Goal: Risk for activity intolerance will decrease Outcome: Progressing

## 2023-07-09 NOTE — Anesthesia Postprocedure Evaluation (Signed)
 Anesthesia Post Note  Patient: Angelica Lopez  Procedure(s) Performed: NEPHROLITHOTOMY PERCUTANEOUS (Right: Flank)  Patient location during evaluation: Phase II Anesthesia Type: General Level of consciousness: awake Pain management: pain level controlled Vital Signs Assessment: post-procedure vital signs reviewed and stable Respiratory status: spontaneous breathing and respiratory function stable Cardiovascular status: blood pressure returned to baseline and stable Postop Assessment: no headache and no apparent nausea or vomiting Anesthetic complications: no Comments: Late entry   No notable events documented.   Last Vitals:  Vitals:   07/08/23 1952 07/09/23 0509  BP: (!) 155/91 (!) 141/80  Pulse: 69 78  Resp: 18 16  Temp: 36.9 C 37.4 C  SpO2: 98% 94%    Last Pain:  Vitals:   07/09/23 0509  TempSrc: Oral  PainSc: 5                  Coretha Dew

## 2023-07-12 ENCOUNTER — Encounter: Payer: Self-pay | Admitting: Urology

## 2023-07-12 ENCOUNTER — Ambulatory Visit (INDEPENDENT_AMBULATORY_CARE_PROVIDER_SITE_OTHER): Admitting: Urology

## 2023-07-12 VITALS — BP 133/85 | HR 77

## 2023-07-12 DIAGNOSIS — Z09 Encounter for follow-up examination after completed treatment for conditions other than malignant neoplasm: Secondary | ICD-10-CM

## 2023-07-12 DIAGNOSIS — Z87442 Personal history of urinary calculi: Secondary | ICD-10-CM

## 2023-07-12 DIAGNOSIS — N200 Calculus of kidney: Secondary | ICD-10-CM

## 2023-07-13 NOTE — Discharge Summary (Signed)
 Physician Discharge Summary  Patient ID: Angelica Lopez MRN: [id number] DOB/AGE: 42-09-1980 42 y.o.  Admit date: 07/08/2023 Discharge date: 07/09/2023  Admission Diagnoses:  Nephrolithiasis  Discharge Diagnoses:  Principal Problem:   Nephrolithiasis   Past Medical History:  Diagnosis Date   Anxiety    Back pain    Depression    Hypertension    Kyphosis    Scheuermann's disease     Surgeries: Procedure(s): NEPHROLITHOTOMY PERCUTANEOUS on 07/08/2023   Consultants (if any):   Discharged Condition: Improved  Hospital Course: Angelica Lopez is an 43 y.o. female who was admitted 07/08/2023 with a diagnosis of Nephrolithiasis and went to the operating room on 07/08/2023 and underwent the above named procedures.    She was given perioperative antibiotics:  Anti-infectives (From admission, onward)    Start     Dose/Rate Route Frequency Ordered Stop   07/08/23 0708  cefTRIAXone  (ROCEPHIN ) 2 g in sodium chloride  0.9 % 100 mL IVPB        2 g 200 mL/hr over 30 Minutes Intravenous 30 min pre-op 07/08/23 0708 07/09/23 0849     .  She was given sequential compression devices, early ambulation for DVT prophylaxis.  She benefited maximally from the hospital stay and there were no complications.    Recent vital signs:  Vitals:   07/08/23 1952 07/09/23 0509  BP: (!) 155/91 (!) 141/80  Pulse: 69 78  Resp: 18 16  Temp: 98.5 F (36.9 C) 99.3 F (37.4 C)  SpO2: 98% 94%    Recent laboratory studies:  Lab Results  Component Value Date   HGB 11.7 (L) 07/09/2023   HGB 12.2 07/08/2023   HGB 12.2 07/07/2023   Lab Results  Component Value Date   WBC 7.1 07/09/2023   PLT 126 (L) 07/09/2023   Lab Results  Component Value Date   INR 1.0 07/07/2023   Lab Results  Component Value Date   NA 138 07/09/2023   K 3.5 07/09/2023   CL 107 07/09/2023   CO2 25 07/09/2023   BUN 20 07/09/2023   CREATININE 0.91 07/09/2023   GLUCOSE 92 07/09/2023    Discharge Medications:    Allergies as of 07/09/2023       Reactions   Codeine  Anaphylaxis   Tongue swells        Medication List     TAKE these medications    Buprenorphine  HCl-Naloxone  HCl 8-2 MG Film Place 1 Film under the tongue in the morning, at noon, and at bedtime.   buPROPion  300 MG 24 hr tablet Commonly known as: WELLBUTRIN  XL Take 300 mg by mouth in the morning.   cetirizine  10 MG tablet Commonly known as: ZyrTEC  Allergy Take 1 tablet (10 mg total) by mouth daily.   clonazePAM  0.5 MG tablet Commonly known as: KLONOPIN  Take 0.5 mg by mouth in the morning and at bedtime.   ibuprofen  200 MG tablet Commonly known as: ADVIL  Take 800 mg by mouth every 8 (eight) hours as needed (pain.).   ketorolac  10 MG tablet Commonly known as: TORADOL  Take 1 tablet (10 mg total) by mouth every 6 (six) hours as needed. What changed: You were already taking a medication with the same name, and this prescription was added. Make sure you understand how and when to take each.   ketorolac  10 MG tablet Commonly known as: TORADOL  Take 1 tablet (10 mg total) by mouth every 6 (six) hours as needed for moderate pain (pain score 4-6) or severe pain (pain score  7-10). What changed: Another medication with the same name was added. Make sure you understand how and when to take each.   levonorgestrel  20 MCG/24HR IUD Commonly known as: MIRENA  1 each by Intrauterine route once.   lisinopril  20 MG tablet Commonly known as: ZESTRIL  Take 1 tablet (20 mg total) by mouth daily. Hold until follow up What changed:  how much to take additional instructions   PROBIOTIC PO Take 1 capsule by mouth in the morning. [place] Women's Probiotic Dietary Supplement   sertraline  100 MG tablet Commonly known as: ZOLOFT  Take 200 mg by mouth in the morning.   traZODone  100 MG tablet Commonly known as: DESYREL  Take 100 mg by mouth at bedtime.        Diagnostic Studies: DG C-Arm 1-60 Min-No Report Result Date:  07/08/2023 Fluoroscopy was utilized by the requesting physician.  No radiographic interpretation.   IR NEPHROSTOMY PLACEMENT RIGHT Result Date: 07/07/2023 INDICATION: 43 year old with a right renal pelvic stone. Patient needs percutaneous access for an upcoming nephrolithotomy procedure. EXAM: PLACEMENT OF RIGHT NEPHROURETERAL CATHETER USING ULTRASOUND AND FLUOROSCOPIC GUIDANCE MEDICATIONS: Rocephin  2 g, Benadryl  50 mg ANESTHESIA/SEDATION: Moderate (conscious) sedation was employed during this procedure. A total of Versed  6 mg and fentanyl  300 mcg was administered intravenously at the order of the provider performing the procedure. Total intra-service moderate sedation time: 40 minutes. Patient's level of consciousness and vital signs were monitored continuously by radiology nurse throughout the procedure under the supervision of the provider performing the procedure. CONTRAST:  20 mL of Omnipaque  300-administered into the collecting system(s) FLUOROSCOPY TIME:  Radiation Exposure Index (as provided by the fluoroscopic device): 43 mGy Kerma COMPLICATIONS: None immediate. PROCEDURE: The procedure was explained to the patient. The risks and benefits of the procedure were discussed and the patient's questions were addressed. Informed consent was obtained from the patient. Patient was placed prone and right flank was prepped and draped in sterile fashion. Maximal barrier sterile technique was utilized including caps, mask, sterile gowns, sterile gloves, sterile drape, hand hygiene and skin antiseptic. Ultrasound was used to identify the right kidney. Mild hydronephrosis in the lower pole was identified and targeted. Skin was anesthetized with 1% lidocaine . Small incision was made. Using ultrasound guidance, 21 gauge needle was directed into a lower pole calyx. Contrast injection confirmed placement in the renal collecting system. 0.018 wire was advanced into the renal pelvis. Accustick dilator set was placed. J wire  was placed. Kumpe catheter was advanced into the renal pelvis and advanced down the right ureter using a Glidewire. Catheter was advanced into the urinary bladder. Catheter was flushed with saline and capped. Catheter was sutured to skin and a dressing was placed. Fluoroscopic and ultrasound images were taken and saved for documentation. FINDINGS: Fluoroscopic image demonstrated a large stone in the right renal pelvic region. Echogenic stone was also identified with ultrasound. Ultrasound demonstrated mild hydronephrosis in the lower pole calices. Nephroureteral catheter extends from a lower pole calyx to the bladder. IMPRESSION: Successful placement of a right nephroureteral catheter using ultrasound and fluoroscopic guidance. Electronically Signed   By: Elene Griffes M.D.   On: 07/07/2023 17:17    Disposition: Discharge disposition: 01-Home or Self Care       Discharge Instructions     Discharge patient   Complete by: As directed    Discharge disposition: 01-Home or Self Care   Discharge patient date: 07/09/2023        Follow-up Information     Keerstin Bjelland, Arden Beck, MD. Call  on 07/13/2023.   Specialty: Urology Contact information: [address]  [place] [address] [phone]                  Signed: Johnie Nailer 07/13/2023, 7:52 AM

## 2023-07-16 LAB — STONE ANALYSIS
Calcium Oxalate Dihydrate: 60 %
Calcium Oxalate Monohydrate: 35 %
Calcium Phosphate (Hydroxyl): 5 %
Weight Calculi: 954 mg

## 2023-07-19 ENCOUNTER — Ambulatory Visit (INDEPENDENT_AMBULATORY_CARE_PROVIDER_SITE_OTHER): Admitting: Urology

## 2023-07-19 ENCOUNTER — Encounter: Payer: Self-pay | Admitting: Urology

## 2023-07-19 VITALS — BP 146/94 | HR 85

## 2023-07-19 DIAGNOSIS — Z466 Encounter for fitting and adjustment of urinary device: Secondary | ICD-10-CM

## 2023-07-19 DIAGNOSIS — Z87442 Personal history of urinary calculi: Secondary | ICD-10-CM

## 2023-07-19 DIAGNOSIS — Z09 Encounter for follow-up examination after completed treatment for conditions other than malignant neoplasm: Secondary | ICD-10-CM

## 2023-07-19 DIAGNOSIS — N200 Calculus of kidney: Secondary | ICD-10-CM

## 2023-07-19 MED ORDER — CEPHALEXIN 250 MG PO CAPS: 500.0000 mg | ORAL_CAPSULE | Freq: Once | ORAL | Status: AC

## 2023-07-19 NOTE — Addendum Note (Signed)
 Addended by: Jalin Erpelding L on: 07/19/2023 03:38 PM   Modules accepted: Orders

## 2023-07-19 NOTE — Progress Notes (Signed)
   07/19/23  CC: followup nephrolithiasis   HPI: Angelica Lopez is a 42yo here for stent removal Blood pressure (!) 146/94, pulse 85. NED. A&Ox3.   No respiratory distress   Abd soft, NT, ND Normal external genitalia with patent urethral meatus  Cystoscopy Procedure Note  Patient identification was confirmed, informed consent was obtained, and patient was prepped using Betadine solution.  Lidocaine  jelly was administered per urethral meatus.    Procedure: - Flexible cystoscope introduced, without any difficulty.   - Thorough search of the bladder revealed:    normal urethral meatus    normal urothelium    no stones    no ulcers     no tumors    no urethral polyps    no trabeculation  - Ureteral orifices were normal in position and appearance. -using a grasper the right ureteral stent was removed intact  Post-Procedure: - Patient tolerated the procedure well  Assessment/ Plan: Followup 3 months with renal US    No follow-ups on file.  Johnie Nailer, MD

## 2023-07-19 NOTE — Patient Instructions (Signed)

## 2023-10-19 ENCOUNTER — Ambulatory Visit (HOSPITAL_COMMUNITY)

## 2023-10-28 ENCOUNTER — Ambulatory Visit (HOSPITAL_COMMUNITY)
Admission: RE | Admit: 2023-10-28 | Discharge: 2023-10-28 | Disposition: A | Discharge status: Home / Self Care | Source: Ambulatory Visit | Attending: Urology | Admitting: Urology

## 2023-10-28 DIAGNOSIS — N200 Calculus of kidney: Secondary | ICD-10-CM | POA: Diagnosis present

## 2023-11-02 ENCOUNTER — Ambulatory Visit: Payer: Self-pay | Admitting: Urology

## 2023-11-12 ENCOUNTER — Ambulatory Visit: Admitting: Urology

## 2023-11-12 VITALS — BP 109/72 | HR 74

## 2023-11-12 DIAGNOSIS — Z09 Encounter for follow-up examination after completed treatment for conditions other than malignant neoplasm: Secondary | ICD-10-CM | POA: Diagnosis not present

## 2023-11-12 DIAGNOSIS — Z87442 Personal history of urinary calculi: Secondary | ICD-10-CM

## 2023-11-12 DIAGNOSIS — N200 Calculus of kidney: Secondary | ICD-10-CM

## 2023-11-12 LAB — URINALYSIS, ROUTINE W REFLEX MICROSCOPIC
Bilirubin, UA: NEGATIVE
Glucose, UA: NEGATIVE
Ketones, UA: NEGATIVE
Leukocytes,UA: NEGATIVE
Nitrite, UA: NEGATIVE
Protein,UA: NEGATIVE
RBC, UA: NEGATIVE
Specific Gravity, UA: 1.02 (ref 1.005–1.030)
Urobilinogen, Ur: 0.2 mg/dL (ref 0.2–1.0)
pH, UA: 6.5 (ref 5.0–7.5)

## 2023-11-12 NOTE — Progress Notes (Signed)
 11/12/2023 10:51 AM   Angelica Lopez 16-Apr-1980 [id number]  Referring provider: Bobbette Coye LABOR, MD [phone]  [place] [address]  Followup nephrolithiasis   HPI: Angelica Lopez is a 42yo here for followup fro nephrolithiasis. No stone events since last visit. Renal US  9/18 shows no calculi. No flank pain. She drinks 120oz of water  daily. She denies any worsening LUTS.    PMH: Past Medical History:  Diagnosis Date   Anxiety    Back pain    Depression    Hypertension    Kyphosis    Scheuermann's disease     Surgical History: Past Surgical History:  Procedure Laterality Date   CESAREAN SECTION     IR NEPHROSTOMY PLACEMENT RIGHT  07/07/2023   NEPHROLITHOTOMY Right 07/08/2023   Procedure: NEPHROLITHOTOMY PERCUTANEOUS;  Surgeon: Sherrilee Belvie CROME, MD;  Location: [facility];  Service: Urology;  Laterality: Right;   TONSILLECTOMY      Home Medications:  Allergies as of 11/12/2023       Reactions   Codeine  Anaphylaxis   Tongue swells        Medication List        Accurate as of November 12, 2023 10:51 AM. If you have any questions, ask your nurse or doctor.          Buprenorphine  HCl-Naloxone  HCl 8-2 MG Film Place 1 Film under the tongue in the morning, at noon, and at bedtime.   buPROPion  300 MG 24 hr tablet Commonly known as: WELLBUTRIN  XL Take 300 mg by mouth in the morning.   cetirizine  10 MG tablet Commonly known as: ZyrTEC  Allergy Take 1 tablet (10 mg total) by mouth daily.   clonazePAM  0.5 MG tablet Commonly known as: KLONOPIN  Take 0.5 mg by mouth in the morning and at bedtime.   ibuprofen  200 MG tablet Commonly known as: ADVIL  Take 800 mg by mouth every 8 (eight) hours as needed (pain.).   ketorolac  10 MG tablet Commonly known as: TORADOL  Take 1 tablet (10 mg total) by mouth every 6 (six) hours as needed.   ketorolac  10 MG tablet Commonly known as: TORADOL  Take 1 tablet (10 mg total) by mouth every 6 (six) hours as needed for  moderate pain (pain score 4-6) or severe pain (pain score 7-10).   levonorgestrel  20 MCG/24HR IUD Commonly known as: MIRENA  1 each by Intrauterine route once.   lisinopril  20 MG tablet Commonly known as: ZESTRIL  Take 1 tablet (20 mg total) by mouth daily. Hold until follow up What changed:  how much to take additional instructions   PROBIOTIC PO Take 1 capsule by mouth in the morning. [place] Women's Probiotic Dietary Supplement   sertraline  100 MG tablet Commonly known as: ZOLOFT  Take 200 mg by mouth in the morning.   traZODone  100 MG tablet Commonly known as: DESYREL  Take 100 mg by mouth at bedtime.        Allergies:  Allergies  Allergen Reactions   Codeine  Anaphylaxis    Tongue swells    Family History: Family History  Problem Relation Age of Onset   Heart disease Maternal Grandfather        has had 3 open heart surgeries   Hypertension Father    Diabetes Father    Anxiety disorder Father    Depression Father    Anxiety disorder Mother    Depression Mother    Febrile seizures Daughter    Febrile seizures Daughter    Febrile seizures Daughter  Social History:  reports that she has been smoking cigarettes. She has a 30 pack-year smoking history. She has never used smokeless tobacco. She reports current drug use. Drug: Marijuana. She reports that she does not drink alcohol.  ROS: All other review of systems were reviewed and are negative except what is noted above in HPI  Physical Exam: BP 109/72   Pulse 74   Constitutional:  Alert and oriented, No acute distress. HEENT: [place] AT, moist mucus membranes.  Trachea midline, no masses. Cardiovascular: No clubbing, cyanosis, or edema. Respiratory: Normal respiratory effort, no increased work of breathing. GI: Abdomen is soft, nontender, nondistended, no abdominal masses GU: No CVA tenderness.  Lymph: No cervical or inguinal lymphadenopathy. Skin: No rashes, bruises or suspicious lesions. Neurologic:  Grossly intact, no focal deficits, moving all 4 extremities. Psychiatric: Normal mood and affect.  Laboratory Data: Lab Results  Component Value Date   WBC 7.1 07/09/2023   HGB 11.7 (L) 07/09/2023   HCT 35.5 (L) 07/09/2023   MCV 86.8 07/09/2023   PLT 126 (L) 07/09/2023    Lab Results  Component Value Date   CREATININE 0.91 07/09/2023    No results found for: PSA  No results found for: TESTOSTERONE  No results found for: HGBA1C  Urinalysis    Component Value Date/Time   COLORURINE YELLOW 05/07/2023 0159   APPEARANCEUR CLOUDY (A) 05/07/2023 0159   LABSPEC 1.017 05/07/2023 0159   PHURINE 7.0 05/07/2023 0159   GLUCOSEU NEGATIVE 05/07/2023 0159   HGBUR MODERATE (A) 05/07/2023 0159   BILIRUBINUR NEGATIVE 05/07/2023 0159   KETONESUR NEGATIVE 05/07/2023 0159   PROTEINUR 30 (A) 05/07/2023 0159   UROBILINOGEN 0.2 05/16/2012 1121   NITRITE NEGATIVE 05/07/2023 0159   LEUKOCYTESUR MODERATE (A) 05/07/2023 0159    Lab Results  Component Value Date   BACTERIA FEW (A) 05/07/2023    Pertinent Imaging: Renal US  10/28/2023: Images reviewed and discussed with the patient No results found for this or any previous visit.  No results found for this or any previous visit.  No results found for this or any previous visit.  No results found for this or any previous visit.  Results for orders placed during the hospital encounter of 10/28/23  US  RENAL  Narrative CLINICAL DATA:  Initial evaluation for nephrolithiasis.  EXAM: RENAL / URINARY TRACT ULTRASOUND COMPLETE  COMPARISON:  Prior CT from 05/07/2023.  FINDINGS: Right Kidney:  Renal measurements: 11.7 x 4.5 x 6.3 cm = volume: 172.2 mL. Renal echogenicity within normal limits. No nephrolithiasis or hydronephrosis. No focal renal mass.  Left Kidney:  Renal measurements: 9.5 x 5.0 x 4.3 cm = volume: 106.5 mL. Evaluation left kidney somewhat limited by shadowing from overlying bowel gas. Renal echogenicity within  normal limits. No visible nephrolithiasis or hydronephrosis. No focal renal mass.  Bladder:  Appears normal for degree of bladder distention. Both jets are seen.  Other:  None.  IMPRESSION: Normal renal ultrasound. No sonographic evidence for nephrolithiasis or obstructive uropathy.   Electronically Signed By: Morene Hoard M.D. On: 10/29/2023 09:47  No results found for this or any previous visit.  No results found for this or any previous visit.  No results found for this or any previous visit.   Assessment & Plan:    1. Nephrolithiasis (Primary) Metabolic evaluation  -followup 3 months  - Urinalysis, Routine w reflex microscopic   No follow-ups on file.  Belvie Clara, MD  [facility] [place]

## 2023-11-13 LAB — BASIC METABOLIC PANEL WITH GFR
BUN/Creatinine Ratio: 21 (ref 9–23)
BUN: 22 mg/dL (ref 6–24)
CO2: 25 mmol/L (ref 20–29)
Calcium: 9.2 mg/dL (ref 8.7–10.2)
Chloride: 100 mmol/L (ref 96–106)
Creatinine, Ser: 1.05 mg/dL — ABNORMAL HIGH (ref 0.57–1.00)
Glucose: 88 mg/dL (ref 70–99)
Potassium: 5.1 mmol/L (ref 3.5–5.2)
Sodium: 136 mmol/L (ref 134–144)
eGFR: 68 mL/min/1.73 (ref >59)

## 2023-11-13 LAB — PTH, INTACT AND CALCIUM: PTH: 47 pg/mL (ref 15–65)

## 2023-11-13 LAB — URIC ACID: Uric Acid: 4.4 mg/dL (ref 2.6–6.2)

## 2023-11-16 ENCOUNTER — Ambulatory Visit: Payer: Self-pay | Admitting: Urology

## 2023-11-16 NOTE — Telephone Encounter (Signed)
 FYI

## 2023-11-18 ENCOUNTER — Encounter: Payer: Self-pay | Admitting: Urology

## 2023-11-18 NOTE — Patient Instructions (Signed)

## 2023-11-25 ENCOUNTER — Other Ambulatory Visit: Payer: Self-pay | Admitting: Urology

## 2023-12-03 LAB — LITHOLINK 24HR URINE PANEL
Ammonium, Urine: 34 mmol/(24.h) (ref 15–60)
Calcium Oxalate Saturation: 3.2 — ABNORMAL LOW (ref 6.00–10.00)
Calcium Phosphate Saturation: 0.3 — ABNORMAL LOW (ref 0.50–2.00)
Calcium, Urine: 65 mg/(24.h) (ref <200)
Calcium/Creatinine Ratio: 57 mg/g{creat} (ref 51–262)
Calcium/Kg Body Weight: 0.9 mg/kg/d (ref <4.0)
Chloride, Urine: 99 mmol/(24.h) (ref 70–250)
Citrate, Urine: 174 mg/(24.h) — ABNORMAL LOW (ref >550)
Creatinine, Urine: 1147 mg/(24.h)
Creatinine/Kg Body Weight: 15.3 mg/kg/d (ref 8.7–20.3)
Cystine, Urine, Qualitative: NEGATIVE
Magnesium, Urine: 75 mg/(24.h) (ref 30–120)
Oxalate, Urine: 22 mg/(24.h) (ref 20–40)
Phosphorus, Urine: 615 mg/(24.h) (ref 600–1200)
Potassium, Urine: 36 mmol/(24.h) (ref 20–100)
Protein Catabolic Rate: 0.6 g/kg/d — ABNORMAL LOW (ref 0.8–1.4)
Sodium, Urine: 90 mmol/(24.h) (ref 50–150)
Sulfate, Urine: 13 meq/(24.h) — ABNORMAL LOW (ref 20–80)
Urea Nitrogen, Urine: 4.97 g/(24.h) — ABNORMAL LOW (ref 6.00–14.00)
Uric Acid Saturation: 0.7 (ref <1.00)
Uric Acid, Urine: 311 mg/(24.h) (ref <750)
Urine Volume (Preserved): 1330 mL/(24.h) (ref 500–4000)
pH, 24 hr, Urine: 5.786 — ABNORMAL LOW (ref 5.800–6.200)

## 2023-12-07 ENCOUNTER — Ambulatory Visit: Payer: Self-pay

## 2023-12-07 DIAGNOSIS — N200 Calculus of kidney: Secondary | ICD-10-CM

## 2023-12-07 MED ORDER — POTASSIUM CITRATE ER 15 MEQ (1620 MG) PO TBCR: 1.0000 | EXTENDED_RELEASE_TABLET | Freq: Two times a day (BID) | ORAL | 1 refills | Status: AC

## 2023-12-07 NOTE — Telephone Encounter (Signed)
-----   Message from Belvie Clara sent at 12/07/2023  9:33 AM EDT ----- 24 hour urine shows low citrate. She should start UrocitK 15meq BID and have a BMP in 2 weeks ----- Message ----- From: Interface, [facility] Lab Results In Sent: 12/03/2023   6:25 AM EDT To: Belvie LITTIE Clara, MD

## 2023-12-23 ENCOUNTER — Other Ambulatory Visit

## 2023-12-28 ENCOUNTER — Other Ambulatory Visit

## 2023-12-31 ENCOUNTER — Other Ambulatory Visit

## 2023-12-31 DIAGNOSIS — N200 Calculus of kidney: Secondary | ICD-10-CM

## 2024-01-01 LAB — BASIC METABOLIC PANEL WITH GFR
BUN/Creatinine Ratio: 20 (ref 9–23)
BUN: 23 mg/dL (ref 6–24)
CO2: 25 mmol/L (ref 20–29)
Calcium: 9.5 mg/dL (ref 8.7–10.2)
Chloride: 100 mmol/L (ref 96–106)
Creatinine, Ser: 1.17 mg/dL — ABNORMAL HIGH (ref 0.57–1.00)
Glucose: 107 mg/dL — ABNORMAL HIGH (ref 70–99)
Potassium: 4.6 mmol/L (ref 3.5–5.2)
Sodium: 138 mmol/L (ref 134–144)
eGFR: 60 mL/min/1.73 (ref >59)

## 2024-01-04 ENCOUNTER — Ambulatory Visit: Payer: Self-pay

## 2024-03-08 ENCOUNTER — Ambulatory Visit (HOSPITAL_COMMUNITY)
Admission: RE | Admit: 2024-03-08 | Discharge: 2024-03-08 | Disposition: A | Discharge status: Home / Self Care | Source: Ambulatory Visit | Attending: Urology | Admitting: Urology

## 2024-03-08 DIAGNOSIS — N200 Calculus of kidney: Secondary | ICD-10-CM | POA: Diagnosis present

## 2024-03-17 ENCOUNTER — Ambulatory Visit: Admitting: Urology

## 2024-03-17 ENCOUNTER — Encounter: Payer: Self-pay | Admitting: Urology

## 2024-03-17 VITALS — BP 110/67 | HR 111

## 2024-03-17 DIAGNOSIS — N200 Calculus of kidney: Secondary | ICD-10-CM

## 2024-03-17 LAB — URINALYSIS, ROUTINE W REFLEX MICROSCOPIC
Glucose, UA: NEGATIVE
Ketones, UA: NEGATIVE
Leukocytes,UA: NEGATIVE
Nitrite, UA: NEGATIVE
Protein,UA: NEGATIVE
RBC, UA: NEGATIVE
Specific Gravity, UA: 1.025 (ref 1.005–1.030)
Urobilinogen, Ur: 1 mg/dL (ref 0.2–1.0)
pH, UA: 6 (ref 5.0–7.5)

## 2024-03-17 NOTE — Patient Instructions (Signed)
 Preventing Kidney Stones: Eating Plan Kidney stones are deposits of minerals and salts that form inside your kidneys. Your risk of developing kidney stones may be greater depending on your diet, your lifestyle, the medicines you take, and whether you have certain medical conditions. Most people can lower their risks of developing kidney stones by following these dietary guidelines. Your dietitian may give you more specific instructions depending on your overall health and the type of kidney stones you tend to develop. What are tips for following this plan? Reading food labels  Choose foods with no salt added or low-salt labels. Limit your salt (sodium) intake to less than 1,500 mg a day. Choose foods with calcium for each meal and snack. Try to eat about 300 mg of calcium at each meal. Foods that contain 200-500 mg of calcium a serving include: 8 oz (237 mL) of milk, calcium-fortifiednon-dairy milk, and calcium-fortifiedfruit juice. Calcium-fortified means that calcium has been added to these drinks. 8 oz (237 mL) of kefir, yogurt, and soy yogurt. 4 oz (114 g) of tofu. 1 oz (28 g) of cheese. 1 cup (150 g) of dried figs. 1 cup (91 g) of cooked broccoli. One 3 oz (85 g) can of sardines or mackerel. Most people need 1,000-1,500 mg of calcium a day. Talk to your dietitian about how much calcium is recommended for you. Shopping Buy plenty of fresh fruits and vegetables. Most people do not need to avoid fruits and vegetables, even if these foods contain nutrients that may contribute to kidney stones. When shopping for convenience foods, choose: Whole pieces of fruit. Pre-made salads with dressing on the side. Low-fat fruit and yogurt smoothies. Avoid buying frozen meals or prepared deli foods. These can be high in sodium. Look for foods with live cultures, such as yogurt and kefir. Choose high-fiber grains, such as whole-wheat breads, oat bran, and wheat cereals. Cooking Do not add salt to  food when cooking. Place a salt shaker on the table and allow each person to add their own salt to taste. Use vegetable protein, such as beans, textured vegetable protein (TVP), or tofu, instead of meat in pasta, casseroles, and soups. Meal planning Eat less salt, if told by your dietitian. To do this: Avoid eating processed or pre-made food. Avoid eating fast food. Eat less animal protein, including cheese, meat, poultry, or fish, if told by your dietitian. To do this: Limit the number of times you have meat, poultry, fish, or cheese each week. Eat a diet free of meat at least 2 days a week. Eat only one serving each day of meat, poultry, fish, or seafood. When you prepare animal proteins, cut pieces into small portion sizes. For most meat and fish, one serving is about the size of the palm of your hand. Eat at least five servings of fresh fruits and vegetables each day. To do this: Keep fruits and vegetables on hand for snacks. Eat one piece of fruit or a handful of berries with breakfast. Have a salad and fruit at lunch. Have two kinds of vegetables at dinner. You may be told to limit foods that are high in a substance called oxalate. These include: Spinach (cooked), rhubarb, beets, sweet potatoes, and Swiss chard. Peanuts. Potato chips, french fries, and baked potatoes with skin on. Nuts and nut products. Chocolate. If you regularly take a diuretic medicine, make sure to eat at least 1 or 2 servings of fruits or vegetables that are high in potassium each day. These include: Avocado. Banana. Orange,  prune, carrot, or tomato juice. Baked potato. Cabbage. Beans and split peas. Lifestyle  Drink enough fluid to keep your urine pale yellow. This is the most important thing you can do. Spread your fluid intake throughout the day. If you drink alcohol: Limit how much you have to: 0-1 drink a day for women who are not pregnant. 0-2 drinks a day for men. Know how much alcohol is in your  drink. In the [place] one drink equals one 12 oz bottle of beer (355 mL), one 5 oz glass of wine (148 mL), or one 1 oz glass of hard liquor (44 mL). Lose weight if told by your health care provider. Work with your dietitian to find an eating plan and weight loss strategies that work best for you. General information Talk to your health care provider and dietitian about taking daily supplements. Depending on your health and the cause of your kidney stones, you may be told: Do not take high-dose supplements of vitamin C (1,000 mg a day or more). To take a calcium supplement. To take a daily probiotic supplement. To take other supplements such as magnesium, fish oil, or vitamin B6. Take over-the-counter and prescription medicines only as told by your health care provider. These include supplements. What foods should I limit? Limit your intake of the following foods, or eat them as told by your dietitian. Vegetables Spinach. Rhubarb. Beets. Canned vegetables. Dene. Olives. Baked potatoes with skin. Grains Wheat bran. Baked goods. Salted crackers. Cereals high in sugar. Meats and other proteins Nuts. Nut butters. Large portions of meat, poultry, or fish. Salted, precooked, or cured meats, such as sausages, meat loaves, and hot dogs. Dairy Cheeses. Beverages Regular soft drinks. Regular vegetable juice. Seasonings and condiments Seasoning blends with salt. Salad dressings. Soy sauce. Ketchup. Barbecue sauce. Other foods Canned soups. Canned pasta sauce. Casseroles. Pizza. Lasagna. Frozen meals. Potato chips. French fries. The items listed above may not be a complete list of foods and beverages you should limit. Contact a dietitian for more information. What foods should I avoid? Talk to your dietitian about specific foods you should avoid based on the type of kidney stones you have and your overall health. Fruits Grapefruit. The item listed above may not be a complete list of foods and  beverages you should avoid. Contact a dietitian for more information. Summary Kidney stones are deposits of minerals and salts that form inside your kidneys. You can lower your risk of kidney stones by making changes to your diet. The most important thing you can do is drink enough fluid. Drink enough fluid to keep your urine pale yellow. Talk to your dietitian about how much calcium you should have each day, and eat less salt and animal protein as told by your dietitian. This information is not intended to replace advice given to you by your health care provider. Make sure you discuss any questions you have with your health care provider. Document Revised: 12/05/2023 Document Reviewed: 05/08/2021 Elsevier Patient Education  2025 [employer].

## 2024-03-17 NOTE — Progress Notes (Signed)
 "  03/17/2024 12:20 PM   Angelica Lopez Jan 17, 1981 [id number]  Referring provider: Bobbette Coye LABOR, MD [phone],  [place] [address]  Followup nephrolithiasis   HPI: Ms Angelica Lopez is a 44yo here for followup for nephrolithiasis. No stone events since last visit. No flank pain. Renal US  1/28 shows questionable bilateral renal calculi which are new since last visit. She drinks 120+oz of water  daily.   PMH: Past Medical History:  Diagnosis Date   Anxiety    Back pain    Depression    Hypertension    Kyphosis    Scheuermann's disease     Surgical History: Past Surgical History:  Procedure Laterality Date   CESAREAN SECTION     IR NEPHROSTOMY PLACEMENT RIGHT  07/07/2023   NEPHROLITHOTOMY Right 07/08/2023   Procedure: NEPHROLITHOTOMY PERCUTANEOUS;  Surgeon: Sherrilee Belvie CROME, MD;  Location: [facility];  Service: Urology;  Laterality: Right;   TONSILLECTOMY      Home Medications:  Allergies as of 03/17/2024       Reactions   Codeine  Anaphylaxis   Tongue swells        Medication List        Accurate as of March 17, 2024 12:20 PM. If you have any questions, ask your nurse or doctor.          Buprenorphine  HCl-Naloxone  HCl 8-2 MG Film Place 1 Film under the tongue in the morning, at noon, and at bedtime.   buPROPion  300 MG 24 hr tablet Commonly known as: WELLBUTRIN  XL Take 300 mg by mouth in the morning.   cetirizine  10 MG tablet Commonly known as: ZyrTEC  Allergy Take 1 tablet (10 mg total) by mouth daily.   clonazePAM  0.5 MG tablet Commonly known as: KLONOPIN  Take 0.5 mg by mouth in the morning and at bedtime.   ibuprofen  200 MG tablet Commonly known as: ADVIL  Take 800 mg by mouth every 8 (eight) hours as needed (pain.).   ketorolac  10 MG tablet Commonly known as: TORADOL  Take 1 tablet (10 mg total) by mouth every 6 (six) hours as needed.   ketorolac  10 MG tablet Commonly known as: TORADOL  Take 1 tablet (10 mg total) by mouth every 6  (six) hours as needed for moderate pain (pain score 4-6) or severe pain (pain score 7-10).   levonorgestrel  20 MCG/24HR IUD Commonly known as: MIRENA  1 each by Intrauterine route once.   lisinopril  20 MG tablet Commonly known as: ZESTRIL  Take 1 tablet (20 mg total) by mouth daily. Hold until follow up What changed:  how much to take additional instructions   Potassium Citrate  15 MEQ (1620 MG) Tbcr Take 1 tablet by mouth 2 (two) times daily.   PROBIOTIC PO Take 1 capsule by mouth in the morning. [place] Women's Probiotic Dietary Supplement   sertraline  100 MG tablet Commonly known as: ZOLOFT  Take 200 mg by mouth in the morning.   traZODone  100 MG tablet Commonly known as: DESYREL  Take 100 mg by mouth at bedtime.        Allergies: Allergies[1]  Family History: Family History  Problem Relation Age of Onset   Heart disease Maternal Grandfather        has had 3 open heart surgeries   Hypertension Father    Diabetes Father    Anxiety disorder Father    Depression Father    Anxiety disorder Mother    Depression Mother    Febrile seizures Daughter    Febrile seizures Daughter  Febrile seizures Daughter     Social History:  reports that she has been smoking cigarettes. She has a 30 pack-year smoking history. She has never used smokeless tobacco. She reports current drug use. Drug: Marijuana. She reports that she does not drink alcohol.  ROS: All other review of systems were reviewed and are negative except what is noted above in HPI  Physical Exam: BP 110/67   Pulse (!) 111   Constitutional:  Alert and oriented, No acute distress. HEENT: [place] AT, moist mucus membranes.  Trachea midline, no masses. Cardiovascular: No clubbing, cyanosis, or edema. Respiratory: Normal respiratory effort, no increased work of breathing. GI: Abdomen is soft, nontender, nondistended, no abdominal masses GU: No CVA tenderness.  Lymph: No cervical or inguinal  lymphadenopathy. Skin: No rashes, bruises or suspicious lesions. Neurologic: Grossly intact, no focal deficits, moving all 4 extremities. Psychiatric: Normal mood and affect.  Laboratory Data: Lab Results  Component Value Date   WBC 7.1 07/09/2023   HGB 11.7 (L) 07/09/2023   HCT 35.5 (L) 07/09/2023   MCV 86.8 07/09/2023   PLT 126 (L) 07/09/2023    Lab Results  Component Value Date   CREATININE 1.17 (H) 12/31/2023    No results found for: PSA  No results found for: TESTOSTERONE  No results found for: HGBA1C  Urinalysis    Component Value Date/Time   COLORURINE YELLOW 05/07/2023 0159   APPEARANCEUR Clear 11/12/2023 1045   LABSPEC 1.017 05/07/2023 0159   PHURINE 7.0 05/07/2023 0159   GLUCOSEU Negative 11/12/2023 1045   HGBUR MODERATE (A) 05/07/2023 0159   BILIRUBINUR Negative 11/12/2023 1045   KETONESUR NEGATIVE 05/07/2023 0159   PROTEINUR Negative 11/12/2023 1045   PROTEINUR 30 (A) 05/07/2023 0159   UROBILINOGEN 0.2 05/16/2012 1121   NITRITE Negative 11/12/2023 1045   NITRITE NEGATIVE 05/07/2023 0159   LEUKOCYTESUR Negative 11/12/2023 1045   LEUKOCYTESUR MODERATE (A) 05/07/2023 0159    Lab Results  Component Value Date   LABMICR Comment 11/12/2023   BACTERIA FEW (A) 05/07/2023    Pertinent Imaging: Renal US  03/08/2024: Images reviewed and discussed with the patient  No results found for this or any previous visit.  No results found for this or any previous visit.  No results found for this or any previous visit.  No results found for this or any previous visit.  Results for orders placed during the hospital encounter of 03/08/24  US  RENAL  Narrative CLINICAL DATA:  Initial evaluation for nephrolithiasis.  EXAM: RENAL / URINARY TRACT ULTRASOUND COMPLETE  COMPARISON:  Comparison made with prior ultrasound from 10/28/2023.  FINDINGS: Right Kidney:  Renal measurements: 10.6 x 4.6 x 6.2 cm = volume: 155.7 mL. Renal echogenicity within normal  limits. 4.2 mm echogenic focus at the mid-lower right kidney, suspicious for a nonobstructive stone. No hydronephrosis. No focal renal mass.  Left Kidney:  Renal measurements: 9.6 x 5.1 x 4.1 cm = volume: 107.4 mL. Renal echogenicity within normal limits. 6.7 mm echogenic focus in the interpolar region suspicious for a nonobstructive calculus. No hydronephrosis. No focal renal mass.  Bladder:  Appears normal for degree of bladder distention. A left ureteral jet was seen. Right jet not seen on this exam.  Other:  None.  IMPRESSION: 1. Signal bilateral nonobstructive calculi, measuring 4.2 cm on the right and 6.7 mm on the left. 2. No hydronephrosis.   Electronically Signed By: Morene Hoard M.D. On: 03/10/2024 05:15  No results found for this or any previous visit.  No results found  for this or any previous visit.  No results found for this or any previous visit.   Assessment & Plan:    1. Nephrolithiasis (Primary) -CT stone study, will call with results -followup 6 months with renal US  - Urinalysis, Routine w reflex microscopic   No follow-ups on file.  Belvie Clara, MD  [facility] [place]      [1]  Allergies Allergen Reactions   Codeine  Anaphylaxis    Tongue swells   "

## 2024-09-14 ENCOUNTER — Other Ambulatory Visit (HOSPITAL_COMMUNITY)

## 2024-09-22 ENCOUNTER — Ambulatory Visit: Admitting: Urology
